# Patient Record
Sex: Female | Born: 1945 | Race: White | Hispanic: No | Marital: Married | Smoking: Never smoker
Health system: Southern US, Community
[De-identification: ages and names within clinical notes are randomized; demographics above are authoritative.]

## PROBLEM LIST (undated history)

## (undated) DIAGNOSIS — R7989 Other specified abnormal findings of blood chemistry: Secondary | ICD-10-CM

## (undated) DIAGNOSIS — H269 Unspecified cataract: Secondary | ICD-10-CM

## (undated) DIAGNOSIS — K8309 Other cholangitis: Secondary | ICD-10-CM

## (undated) DIAGNOSIS — M899 Disorder of bone, unspecified: Secondary | ICD-10-CM

## (undated) DIAGNOSIS — K219 Gastro-esophageal reflux disease without esophagitis: Secondary | ICD-10-CM

## (undated) DIAGNOSIS — E785 Hyperlipidemia, unspecified: Secondary | ICD-10-CM

## (undated) DIAGNOSIS — D649 Anemia, unspecified: Secondary | ICD-10-CM

## (undated) DIAGNOSIS — E041 Nontoxic single thyroid nodule: Secondary | ICD-10-CM

## (undated) DIAGNOSIS — K862 Cyst of pancreas: Secondary | ICD-10-CM

## (undated) DIAGNOSIS — N289 Disorder of kidney and ureter, unspecified: Secondary | ICD-10-CM

## (undated) DIAGNOSIS — E119 Type 2 diabetes mellitus without complications: Secondary | ICD-10-CM

## (undated) DIAGNOSIS — I10 Essential (primary) hypertension: Secondary | ICD-10-CM

## (undated) DIAGNOSIS — T7840XA Allergy, unspecified, initial encounter: Secondary | ICD-10-CM

## (undated) DIAGNOSIS — M353 Polymyalgia rheumatica: Secondary | ICD-10-CM

## (undated) DIAGNOSIS — K810 Acute cholecystitis: Secondary | ICD-10-CM

## (undated) DIAGNOSIS — R945 Abnormal results of liver function studies: Secondary | ICD-10-CM

## (undated) DIAGNOSIS — I4891 Unspecified atrial fibrillation: Secondary | ICD-10-CM

## (undated) DIAGNOSIS — G709 Myoneural disorder, unspecified: Secondary | ICD-10-CM

## (undated) DIAGNOSIS — R911 Solitary pulmonary nodule: Secondary | ICD-10-CM

## (undated) HISTORY — DX: Disorder of bone, unspecified: M89.9

## (undated) HISTORY — DX: Other cholangitis: K83.09

## (undated) HISTORY — DX: Hyperlipidemia, unspecified: E78.5

## (undated) HISTORY — DX: Disorder of kidney and ureter, unspecified: N28.9

## (undated) HISTORY — DX: Unspecified atrial fibrillation: I48.91

## (undated) HISTORY — DX: Allergy, unspecified, initial encounter: T78.40XA

## (undated) HISTORY — DX: Abnormal results of liver function studies: R94.5

## (undated) HISTORY — DX: Solitary pulmonary nodule: R91.1

## (undated) HISTORY — DX: Nontoxic single thyroid nodule: E04.1

## (undated) HISTORY — DX: Anemia, unspecified: D64.9

## (undated) HISTORY — DX: Acute cholecystitis: K81.0

## (undated) HISTORY — DX: Other specified abnormal findings of blood chemistry: R79.89

## (undated) HISTORY — DX: Unspecified cataract: H26.9

## (undated) HISTORY — DX: Myoneural disorder, unspecified: G70.9

## (undated) HISTORY — DX: Polymyalgia rheumatica: M35.3

## (undated) HISTORY — DX: Cyst of pancreas: K86.2

## (undated) HISTORY — PX: COLONOSCOPY: SHX174

---

## 2002-05-30 ENCOUNTER — Encounter: Admission: RE | Admit: 2002-05-30 | Discharge: 2002-08-28 | Payer: Self-pay | Admitting: Family Medicine

## 2002-09-12 ENCOUNTER — Encounter: Admission: RE | Admit: 2002-09-12 | Discharge: 2002-12-11 | Payer: Self-pay | Admitting: Family Medicine

## 2002-12-19 ENCOUNTER — Ambulatory Visit (HOSPITAL_COMMUNITY): Admission: RE | Admit: 2002-12-19 | Discharge: 2002-12-19 | Payer: Self-pay | Admitting: *Deleted

## 2012-05-28 ENCOUNTER — Emergency Department (HOSPITAL_BASED_OUTPATIENT_CLINIC_OR_DEPARTMENT_OTHER)
Admission: EM | Admit: 2012-05-28 | Discharge: 2012-05-28 | Disposition: A | Discharge status: Home / Self Care | Payer: Medicare Other | Attending: Emergency Medicine | Admitting: Emergency Medicine

## 2012-05-28 ENCOUNTER — Encounter (HOSPITAL_BASED_OUTPATIENT_CLINIC_OR_DEPARTMENT_OTHER): Payer: Self-pay | Admitting: *Deleted

## 2012-05-28 ENCOUNTER — Emergency Department (HOSPITAL_BASED_OUTPATIENT_CLINIC_OR_DEPARTMENT_OTHER): Payer: Medicare Other

## 2012-05-28 DIAGNOSIS — Y9389 Activity, other specified: Secondary | ICD-10-CM | POA: Insufficient documentation

## 2012-05-28 DIAGNOSIS — E119 Type 2 diabetes mellitus without complications: Secondary | ICD-10-CM | POA: Insufficient documentation

## 2012-05-28 DIAGNOSIS — Z79899 Other long term (current) drug therapy: Secondary | ICD-10-CM | POA: Insufficient documentation

## 2012-05-28 DIAGNOSIS — I10 Essential (primary) hypertension: Secondary | ICD-10-CM | POA: Insufficient documentation

## 2012-05-28 DIAGNOSIS — S8251XA Displaced fracture of medial malleolus of right tibia, initial encounter for closed fracture: Secondary | ICD-10-CM

## 2012-05-28 DIAGNOSIS — X500XXA Overexertion from strenuous movement or load, initial encounter: Secondary | ICD-10-CM | POA: Insufficient documentation

## 2012-05-28 DIAGNOSIS — Y9289 Other specified places as the place of occurrence of the external cause: Secondary | ICD-10-CM | POA: Insufficient documentation

## 2012-05-28 DIAGNOSIS — S8253XA Displaced fracture of medial malleolus of unspecified tibia, initial encounter for closed fracture: Secondary | ICD-10-CM | POA: Insufficient documentation

## 2012-05-28 HISTORY — DX: Type 2 diabetes mellitus without complications: E11.9

## 2012-05-28 HISTORY — DX: Essential (primary) hypertension: I10

## 2012-05-28 LAB — GLUCOSE, CAPILLARY: Glucose-Capillary: 90 mg/dL (ref 70–99)

## 2012-05-28 MED ORDER — HYDROCODONE-ACETAMINOPHEN 5-325 MG PO TABS: 2.0000 | ORAL_TABLET | ORAL | Status: DC | PRN

## 2012-05-28 NOTE — ED Provider Notes (Signed)
History    This chart was scribed for Geoffery Lyons, MD scribed by Magnus Sinning. The patient was seen in room MH09/MH09 at 16:20   CSN: [id number]  Arrival date & time 05/28/12  1418   Chief Complaint  Patient presents with  . Ankle Pain    (Consider location/radiation/quality/duration/timing/severity/associated sxs/prior treatment) Patient is a 67 y.o. female presenting with ankle pain. The history is provided by the patient. No language interpreter was used.  Ankle Pain  Tara Serrano is a 67 y.o. female who presents to the Emergency Department complaining of gradually worsening right ankle pain, onset yesterday night. She states they ran errands yesterday and that after she stepped out of the car yesterday when she started having sudden-onset pain on the inside of right ankle. She says that the pain is aggravated when bearing weight. She states that she has been hopping to get around.   Past Medical History  Diagnosis Date  . Diabetes mellitus without complication   . Hypertension     History reviewed. No pertinent past surgical history.  History reviewed. No pertinent family history.  History  Substance Use Topics  . Smoking status: Never Smoker   . Smokeless tobacco: Not on file  . Alcohol Use: No   Review of Systems  Musculoskeletal:       Right ankle pain  All other systems reviewed and are negative.    Allergies  Statins  Home Medications   Current Outpatient Rx  Name  Route  Sig  Dispense  Refill  . carvedilol (COREG) 25 MG tablet   Oral   Take 25 mg by mouth 2 (two) times daily with a meal.         . furosemide (LASIX) 20 MG tablet   Oral   Take 20 mg by mouth 2 (two) times daily.         Marland Kitchen glimepiride (AMARYL) 4 MG tablet   Oral   Take 8 mg by mouth daily before breakfast.         . losartan (COZAAR) 100 MG tablet   Oral   Take 100 mg by mouth daily.         Marland Kitchen NIFEdipine (ADALAT CC) 90 MG 24 hr tablet   Oral   Take 90 mg by mouth  daily.         . RABEprazole (ACIPHEX) 20 MG tablet   Oral   Take 20 mg by mouth daily.         . sitaGLIPtin (JANUVIA) 100 MG tablet   Oral   Take 100 mg by mouth daily.           BP 140/70  Pulse 78  Temp(Src) 98.1 F (36.7 C) (Oral)  Resp 20  Ht 5' (1.524 m)  Wt 207 lb (93.895 kg)  BMI 40.43 kg/m2  SpO2 98%  Physical Exam  Nursing note and vitals reviewed. Constitutional: She is oriented to person, place, and time. She appears well-developed and well-nourished. No distress.  HENT:  Head: Normocephalic and atraumatic.  Eyes: Conjunctivae and EOM are normal.  Neck: Neck supple. No tracheal deviation present.  Cardiovascular: Normal rate.   Pulmonary/Chest: Effort normal. No respiratory distress.  Abdominal: She exhibits no distension.  Musculoskeletal: She exhibits edema.  There is swelling and TTP over the medial malleolus. There is pain with ROM, but the ankle appears stable otherwise.    Neurological: She is alert and oriented to person, place, and time. No sensory deficit.  Skin: Skin is  dry.  Psychiatric: She has a normal mood and affect. Her behavior is normal.    ED Course  Procedures (including critical care time) DIAGNOSTIC STUDIES: Oxygen Saturation is 98% on room air, normal by my interpretation.    COORDINATION OF CARE: 16:21: Physical exam performed and radiology results provided. Informed patient of intent to wrap ankle and d/c with crutches, pain medications and recommendations to keep ankle elevated and minimal weight bearing.  Labs Reviewed  GLUCOSE, CAPILLARY   Dg Ankle Complete Right  05/28/2012  *RADIOLOGY REPORT*  Clinical Data: Ankle pain  RIGHT ANKLE - COMPLETE 3+ VIEW  Comparison: None.  Findings: Slight cortical irregularity of the medial talus could represent a fracture however this could also be normal.  The ankle mortise is intact.  There is a small bony fragment adjacent to the medial malleolus which is probably due to old injury  or an ossicle. There is no joint effusion.  There is calcaneal spurring.  IMPRESSION: Possible small avulsion fracture of the medial talus however this is not definitely a fracture.  Irregularity of the medial malleolus which appears chronic.   Original Report Authenticated By: Janeece Riggers, M.D.      No diagnosis found.    MDM  There appears to be an avulsion fracture of the medial malleolus or talus although there was no definite injury.  She will be treated with ace, rest, and prn follow up.  She was given crutches but had difficulty using these.  Prescription for walker given.  To return prn.    I personally performed the services described in this documentation, which was scribed in my presence. The recorded information has been reviewed and is accurate.           Geoffery Lyons, MD 05/29/12 [phone]

## 2012-05-28 NOTE — ED Notes (Signed)
Pt6 c/o right ankle pain since yesterday. No known injury. PMS intact.

## 2016-11-06 ENCOUNTER — Inpatient Hospital Stay (HOSPITAL_COMMUNITY): Payer: Medicare Other

## 2016-11-06 ENCOUNTER — Emergency Department (HOSPITAL_COMMUNITY): Payer: Medicare Other

## 2016-11-06 ENCOUNTER — Encounter (HOSPITAL_COMMUNITY): Payer: Self-pay | Admitting: Emergency Medicine

## 2016-11-06 ENCOUNTER — Inpatient Hospital Stay (HOSPITAL_COMMUNITY)
Admission: EM | Admit: 2016-11-06 | Discharge: 2016-11-12 | DRG: 853 | Disposition: A | Discharge status: Home / Self Care | Payer: Medicare Other | Attending: Family Medicine | Admitting: Family Medicine

## 2016-11-06 DIAGNOSIS — D649 Anemia, unspecified: Secondary | ICD-10-CM | POA: Diagnosis not present

## 2016-11-06 DIAGNOSIS — R6520 Severe sepsis without septic shock: Secondary | ICD-10-CM | POA: Diagnosis not present

## 2016-11-06 DIAGNOSIS — I481 Persistent atrial fibrillation: Secondary | ICD-10-CM | POA: Diagnosis present

## 2016-11-06 DIAGNOSIS — E876 Hypokalemia: Secondary | ICD-10-CM | POA: Diagnosis present

## 2016-11-06 DIAGNOSIS — K760 Fatty (change of) liver, not elsewhere classified: Secondary | ICD-10-CM | POA: Diagnosis present

## 2016-11-06 DIAGNOSIS — J9601 Acute respiratory failure with hypoxia: Secondary | ICD-10-CM | POA: Diagnosis present

## 2016-11-06 DIAGNOSIS — I5023 Acute on chronic systolic (congestive) heart failure: Secondary | ICD-10-CM | POA: Diagnosis present

## 2016-11-06 DIAGNOSIS — N183 Chronic kidney disease, stage 3 (moderate): Secondary | ICD-10-CM | POA: Diagnosis present

## 2016-11-06 DIAGNOSIS — Z79899 Other long term (current) drug therapy: Secondary | ICD-10-CM

## 2016-11-06 DIAGNOSIS — A419 Sepsis, unspecified organism: Secondary | ICD-10-CM | POA: Diagnosis present

## 2016-11-06 DIAGNOSIS — R948 Abnormal results of function studies of other organs and systems: Secondary | ICD-10-CM | POA: Diagnosis not present

## 2016-11-06 DIAGNOSIS — I4891 Unspecified atrial fibrillation: Secondary | ICD-10-CM | POA: Diagnosis not present

## 2016-11-06 DIAGNOSIS — I130 Hypertensive heart and chronic kidney disease with heart failure and stage 1 through stage 4 chronic kidney disease, or unspecified chronic kidney disease: Secondary | ICD-10-CM | POA: Diagnosis present

## 2016-11-06 DIAGNOSIS — Z888 Allergy status to other drugs, medicaments and biological substances status: Secondary | ICD-10-CM | POA: Diagnosis not present

## 2016-11-06 DIAGNOSIS — Z7984 Long term (current) use of oral hypoglycemic drugs: Secondary | ICD-10-CM

## 2016-11-06 DIAGNOSIS — K862 Cyst of pancreas: Secondary | ICD-10-CM | POA: Diagnosis not present

## 2016-11-06 DIAGNOSIS — E1122 Type 2 diabetes mellitus with diabetic chronic kidney disease: Secondary | ICD-10-CM | POA: Diagnosis present

## 2016-11-06 DIAGNOSIS — Z6839 Body mass index (BMI) 39.0-39.9, adult: Secondary | ICD-10-CM | POA: Diagnosis not present

## 2016-11-06 DIAGNOSIS — K819 Cholecystitis, unspecified: Secondary | ICD-10-CM | POA: Diagnosis not present

## 2016-11-06 DIAGNOSIS — R6521 Severe sepsis with septic shock: Secondary | ICD-10-CM | POA: Diagnosis present

## 2016-11-06 DIAGNOSIS — G9341 Metabolic encephalopathy: Secondary | ICD-10-CM | POA: Diagnosis present

## 2016-11-06 DIAGNOSIS — R911 Solitary pulmonary nodule: Secondary | ICD-10-CM | POA: Diagnosis present

## 2016-11-06 DIAGNOSIS — N179 Acute kidney failure, unspecified: Secondary | ICD-10-CM | POA: Diagnosis present

## 2016-11-06 DIAGNOSIS — R160 Hepatomegaly, not elsewhere classified: Secondary | ICD-10-CM | POA: Diagnosis present

## 2016-11-06 DIAGNOSIS — E669 Obesity, unspecified: Secondary | ICD-10-CM | POA: Diagnosis present

## 2016-11-06 DIAGNOSIS — J810 Acute pulmonary edema: Secondary | ICD-10-CM | POA: Diagnosis not present

## 2016-11-06 DIAGNOSIS — R7989 Other specified abnormal findings of blood chemistry: Secondary | ICD-10-CM | POA: Diagnosis present

## 2016-11-06 DIAGNOSIS — M899 Disorder of bone, unspecified: Secondary | ICD-10-CM

## 2016-11-06 DIAGNOSIS — R0603 Acute respiratory distress: Secondary | ICD-10-CM

## 2016-11-06 DIAGNOSIS — K810 Acute cholecystitis: Secondary | ICD-10-CM

## 2016-11-06 DIAGNOSIS — I5021 Acute systolic (congestive) heart failure: Secondary | ICD-10-CM | POA: Diagnosis not present

## 2016-11-06 DIAGNOSIS — K869 Disease of pancreas, unspecified: Secondary | ICD-10-CM

## 2016-11-06 DIAGNOSIS — R0781 Pleurodynia: Secondary | ICD-10-CM

## 2016-11-06 DIAGNOSIS — K8309 Other cholangitis: Secondary | ICD-10-CM

## 2016-11-06 DIAGNOSIS — I5043 Acute on chronic combined systolic (congestive) and diastolic (congestive) heart failure: Secondary | ICD-10-CM | POA: Diagnosis not present

## 2016-11-06 DIAGNOSIS — R079 Chest pain, unspecified: Secondary | ICD-10-CM | POA: Diagnosis not present

## 2016-11-06 DIAGNOSIS — R945 Abnormal results of liver function studies: Secondary | ICD-10-CM

## 2016-11-06 DIAGNOSIS — K8020 Calculus of gallbladder without cholecystitis without obstruction: Secondary | ICD-10-CM

## 2016-11-06 DIAGNOSIS — K219 Gastro-esophageal reflux disease without esophagitis: Secondary | ICD-10-CM | POA: Diagnosis present

## 2016-11-06 DIAGNOSIS — R630 Anorexia: Secondary | ICD-10-CM | POA: Diagnosis present

## 2016-11-06 DIAGNOSIS — I110 Hypertensive heart disease with heart failure: Secondary | ICD-10-CM | POA: Diagnosis not present

## 2016-11-06 DIAGNOSIS — K8066 Calculus of gallbladder and bile duct with acute and chronic cholecystitis without obstruction: Secondary | ICD-10-CM | POA: Diagnosis present

## 2016-11-06 DIAGNOSIS — K830 Cholangitis: Secondary | ICD-10-CM | POA: Diagnosis present

## 2016-11-06 DIAGNOSIS — R1013 Epigastric pain: Secondary | ICD-10-CM | POA: Diagnosis present

## 2016-11-06 HISTORY — DX: Gastro-esophageal reflux disease without esophagitis: K21.9

## 2016-11-06 HISTORY — DX: Acute cholecystitis: K81.0

## 2016-11-06 LAB — I-STAT CG4 LACTIC ACID, ED
LACTIC ACID, VENOUS: 4.09 mmol/L — AB (ref 0.5–1.9)
Lactic Acid, Venous: 2.1 mmol/L (ref 0.5–1.9)

## 2016-11-06 LAB — I-STAT ARTERIAL BLOOD GAS, ED
Acid-Base Excess: 3 mmol/L — ABNORMAL HIGH (ref 0.0–2.0)
Bicarbonate: 27 mmol/L (ref 20.0–28.0)
O2 Saturation: 95 %
PCO2 ART: 37.9 mmHg (ref 32.0–48.0)
PO2 ART: 70 mmHg — AB (ref 83.0–108.0)
TCO2: 28 mmol/L (ref 22–32)
pH, Arterial: 7.461 — ABNORMAL HIGH (ref 7.350–7.450)

## 2016-11-06 LAB — CBC
HCT: 37.8 % (ref 36.0–46.0)
Hemoglobin: 12.2 g/dL (ref 12.0–15.0)
MCH: 28.3 pg (ref 26.0–34.0)
MCHC: 32.3 g/dL (ref 30.0–36.0)
MCV: 87.7 fL (ref 78.0–100.0)
PLATELETS: 256 10*3/uL (ref 150–400)
RBC: 4.31 MIL/uL (ref 3.87–5.11)
RDW: 13.6 % (ref 11.5–15.5)
WBC: 10 10*3/uL (ref 4.0–10.5)

## 2016-11-06 LAB — GLUCOSE, CAPILLARY
GLUCOSE-CAPILLARY: 155 mg/dL — AB (ref 65–99)
GLUCOSE-CAPILLARY: 189 mg/dL — AB (ref 65–99)
Glucose-Capillary: 178 mg/dL — ABNORMAL HIGH (ref 65–99)
Glucose-Capillary: 193 mg/dL — ABNORMAL HIGH (ref 65–99)
Glucose-Capillary: 170 mg/dL — ABNORMAL HIGH (ref 65–99)

## 2016-11-06 LAB — URINALYSIS, ROUTINE W REFLEX MICROSCOPIC
BACTERIA UA: NONE SEEN
Bilirubin Urine: NEGATIVE
Glucose, UA: 50 mg/dL — AB
Hgb urine dipstick: NEGATIVE
Ketones, ur: NEGATIVE mg/dL
Leukocytes, UA: NEGATIVE
Nitrite: NEGATIVE
PROTEIN: 30 mg/dL — AB
SPECIFIC GRAVITY, URINE: 1.032 — AB (ref 1.005–1.030)
pH: 5 (ref 5.0–8.0)

## 2016-11-06 LAB — LACTIC ACID, PLASMA
LACTIC ACID, VENOUS: 2.7 mmol/L — AB (ref 0.5–1.9)
LACTIC ACID, VENOUS: 2.5 mmol/L — AB (ref 0.5–1.9)

## 2016-11-06 LAB — COMPREHENSIVE METABOLIC PANEL
ALBUMIN: 3.2 g/dL — AB (ref 3.5–5.0)
ALK PHOS: 399 U/L — AB (ref 38–126)
ALT: 163 U/L — AB (ref 14–54)
AST: 246 U/L — ABNORMAL HIGH (ref 15–41)
Anion gap: 14 (ref 5–15)
BILIRUBIN TOTAL: 2.5 mg/dL — AB (ref 0.3–1.2)
BUN: 25 mg/dL — AB (ref 6–20)
CO2: 27 mmol/L (ref 22–32)
CREATININE: 1.33 mg/dL — AB (ref 0.44–1.00)
Calcium: 8.8 mg/dL — ABNORMAL LOW (ref 8.9–10.3)
Chloride: 95 mmol/L — ABNORMAL LOW (ref 101–111)
GFR calc Af Amer: 45 mL/min — ABNORMAL LOW (ref >60)
GFR, EST NON AFRICAN AMERICAN: 39 mL/min — AB (ref >60)
GLUCOSE: 251 mg/dL — AB (ref 65–99)
Potassium: 2.6 mmol/L — CL (ref 3.5–5.1)
Sodium: 136 mmol/L (ref 135–145)
Total Protein: 7.3 g/dL (ref 6.5–8.1)

## 2016-11-06 LAB — POCT I-STAT 3, ART BLOOD GAS (G3+)
ACID-BASE EXCESS: 7 mmol/L — AB (ref 0.0–2.0)
Bicarbonate: 31.8 mmol/L — ABNORMAL HIGH (ref 20.0–28.0)
O2 Saturation: 97 %
TCO2: 33 mmol/L — ABNORMAL HIGH (ref 22–32)
pCO2 arterial: 44.2 mmHg (ref 32.0–48.0)
pH, Arterial: 7.465 — ABNORMAL HIGH (ref 7.350–7.450)
pO2, Arterial: 90 mmHg (ref 83.0–108.0)

## 2016-11-06 LAB — BRAIN NATRIURETIC PEPTIDE: B Natriuretic Peptide: 369.6 pg/mL — ABNORMAL HIGH (ref 0.0–100.0)

## 2016-11-06 LAB — LIPASE, BLOOD: Lipase: 28 U/L (ref 11–51)

## 2016-11-06 LAB — I-STAT TROPONIN, ED: TROPONIN I, POC: 0.01 ng/mL (ref 0.00–0.08)

## 2016-11-06 LAB — TROPONIN I
TROPONIN I: 0.03 ng/mL — AB (ref <0.03)
TROPONIN I: 0.05 ng/mL — AB (ref <0.03)
TROPONIN I: 0.03 ng/mL — AB (ref <0.03)

## 2016-11-06 LAB — MAGNESIUM: Magnesium: 1.6 mg/dL — ABNORMAL LOW (ref 1.7–2.4)

## 2016-11-06 LAB — MRSA PCR SCREENING: MRSA by PCR: NEGATIVE

## 2016-11-06 LAB — PROCALCITONIN: Procalcitonin: 3.59 ng/mL

## 2016-11-06 MED ORDER — SODIUM CHLORIDE 0.9 % IV SOLN
INTRAVENOUS | Status: DC
Start: 2016-11-06 — End: 2016-11-06

## 2016-11-06 MED ORDER — POTASSIUM CHLORIDE CRYS ER 20 MEQ PO TBCR
40.0000 meq | EXTENDED_RELEASE_TABLET | Freq: Once | ORAL | Status: AC
  Administered 2016-11-06: 40 meq via ORAL
  Filled 2016-11-06: qty 2

## 2016-11-06 MED ORDER — INSULIN ASPART 100 UNIT/ML ~~LOC~~ SOLN
2.0000 [IU] | SUBCUTANEOUS | Status: DC
  Administered 2016-11-06 (×5): 4 [IU] via SUBCUTANEOUS
  Administered 2016-11-07: 2 [IU] via SUBCUTANEOUS
  Administered 2016-11-07: 4 [IU] via SUBCUTANEOUS

## 2016-11-06 MED ORDER — SODIUM CHLORIDE 0.9 % IV SOLN
250.0000 mL | INTRAVENOUS | Status: DC | PRN
  Administered 2016-11-07: 250 mL via INTRAVENOUS

## 2016-11-06 MED ORDER — FUROSEMIDE 10 MG/ML IJ SOLN
40.0000 mg | Freq: Once | INTRAMUSCULAR | Status: AC
  Administered 2016-11-06: 40 mg via INTRAVENOUS
  Filled 2016-11-06: qty 4

## 2016-11-06 MED ORDER — HEPARIN SODIUM (PORCINE) 5000 UNIT/ML IJ SOLN
5000.0000 [IU] | Freq: Three times a day (TID) | INTRAMUSCULAR | Status: AC
  Administered 2016-11-06 (×2): 5000 [IU] via SUBCUTANEOUS
  Filled 2016-11-06 (×2): qty 1

## 2016-11-06 MED ORDER — PANTOPRAZOLE SODIUM 40 MG IV SOLR
40.0000 mg | INTRAVENOUS | Status: DC
  Administered 2016-11-06 – 2016-11-12 (×6): 40 mg via INTRAVENOUS
  Filled 2016-11-06 (×6): qty 40

## 2016-11-06 MED ORDER — POTASSIUM CHLORIDE 10 MEQ/100ML IV SOLN
10.0000 meq | INTRAVENOUS | Status: AC
  Administered 2016-11-06 (×2): 10 meq via INTRAVENOUS
  Filled 2016-11-06 (×2): qty 100

## 2016-11-06 MED ORDER — FENTANYL CITRATE (PF) 100 MCG/2ML IJ SOLN
12.5000 ug | INTRAMUSCULAR | Status: DC | PRN
  Administered 2016-11-06: 12.5 ug via INTRAVENOUS
  Filled 2016-11-06: qty 2

## 2016-11-06 MED ORDER — PIPERACILLIN-TAZOBACTAM 3.375 G IVPB
3.3750 g | Freq: Three times a day (TID) | INTRAVENOUS | Status: DC
  Administered 2016-11-06 – 2016-11-11 (×17): 3.375 g via INTRAVENOUS
  Filled 2016-11-06 (×19): qty 50

## 2016-11-06 MED ORDER — ONDANSETRON HCL 4 MG/2ML IJ SOLN
4.0000 mg | Freq: Once | INTRAMUSCULAR | Status: AC
  Administered 2016-11-06: 4 mg via INTRAVENOUS
  Filled 2016-11-06: qty 2

## 2016-11-06 MED ORDER — HEPARIN SODIUM (PORCINE) 1000 UNIT/ML IJ SOLN
5000.0000 [IU] | Freq: Three times a day (TID) | INTRAMUSCULAR | Status: DC
  Filled 2016-11-06: qty 5

## 2016-11-06 MED ORDER — FENTANYL CITRATE (PF) 100 MCG/2ML IJ SOLN
50.0000 ug | Freq: Once | INTRAMUSCULAR | Status: AC
  Administered 2016-11-06: 50 ug via INTRAVENOUS
  Filled 2016-11-06: qty 2

## 2016-11-06 MED ORDER — MAGNESIUM SULFATE 2 GM/50ML IV SOLN
2.0000 g | Freq: Once | INTRAVENOUS | Status: AC
  Administered 2016-11-06: 2 g via INTRAVENOUS
  Filled 2016-11-06: qty 50

## 2016-11-06 MED ORDER — SODIUM CHLORIDE 0.9 % IV BOLUS (SEPSIS): 1000.0000 mL | Freq: Once | INTRAVENOUS | Status: DC

## 2016-11-06 MED ORDER — TECHNETIUM TC 99M MEBROFENIN IV KIT
5.0000 | PACK | Freq: Once | INTRAVENOUS | Status: AC | PRN
  Administered 2016-11-06: 5 via INTRAVENOUS

## 2016-11-06 MED ORDER — IOPAMIDOL (ISOVUE-300) INJECTION 61%
INTRAVENOUS | Status: AC
  Administered 2016-11-06: 100 mL
  Filled 2016-11-06: qty 100

## 2016-11-06 MED ORDER — HYDRALAZINE HCL 20 MG/ML IJ SOLN: 10.0000 mg | Freq: Four times a day (QID) | INTRAMUSCULAR | Status: DC | PRN

## 2016-11-06 NOTE — ED Notes (Signed)
Dr Wyvonnia Dusky is given a copy of lactic acid results 4.09

## 2016-11-06 NOTE — Progress Notes (Signed)
Patients SATs dropped to high 70s, low 80s. Very drowsy, will wake for a few seconds then go back to sleep. Respiratory called, patient put on a veni mask with no improvement, switched to bipap. MD made aware, blood gas drawn with no significant changes. Currently SATs at 97%. Will continue to monitor.

## 2016-11-06 NOTE — ED Notes (Signed)
Patient transported to Ultrasound 

## 2016-11-06 NOTE — ED Notes (Signed)
Called main lab to add on Mg 

## 2016-11-06 NOTE — Progress Notes (Signed)
Patient ID: Tara Serrano, female   DOB: [birth year], 71 y.o.   MRN: [id number]   Request for percutaneous cholecystostomy drain placement  CT imaging reviewed with Dr Vernard Gambles  He has recommended HIDA scan Now ordered  Will review results as soon as available  Plan accordingly

## 2016-11-06 NOTE — Procedures (Signed)
Pt transported from ED to 2M11 on Bipap, no complications.

## 2016-11-06 NOTE — H&P (Signed)
PULMONARY / CRITICAL CARE MEDICINE   Name: Tara Serrano MRN: [id number] DOB: Nov 03, 1945    ADMISSION DATE:  11/06/2016 CONSULTATION DATE:  11/06/2016  REFERRING MD:  Dr. Wyvonnia Dusky   CHIEF COMPLAINT:  Acute Cholecystis   HISTORY OF PRESENT ILLNESS:   71 year old female with PMH of HTN, DM, GERD   Presents to ED on 8/31 with 1 week history of upper abdominal pain with nausea and poor appetite. Upon arrival WBC 10, LA 2.10, AST 246, ALT 163. CT A/P with distended gallbladder and mild biliary dilatation. Through course of stay in ED patient became hypoxic requiring BiPAP. CXR with diffuse bilateral pulmonary infiltrates consistent with pulmonary edema. Surgery deemed patient to unstable for surgery at this time. PCCM asked to admit.    PAST MEDICAL HISTORY :  She  has a past medical history of Diabetes mellitus without complication ([place]); GERD (gastroesophageal reflux disease); and Hypertension.  PAST SURGICAL HISTORY: She  has no past surgical history on file.  Allergies  Allergen Reactions  . Statins Other (See Comments)    Muscle problems    No current facility-administered medications on file prior to encounter.    Current Outpatient Prescriptions on File Prior to Encounter  Medication Sig  . carvedilol (COREG) 25 MG tablet Take 25 mg by mouth 2 (two) times daily with a meal.  . furosemide (LASIX) 20 MG tablet Take 20 mg by mouth 2 (two) times daily.  Marland Kitchen glimepiride (AMARYL) 4 MG tablet Take 8 mg by mouth daily before breakfast.  . HYDROcodone-acetaminophen (NORCO) 5-325 MG per tablet Take 2 tablets by mouth every 4 (four) hours as needed for pain.  Marland Kitchen losartan (COZAAR) 100 MG tablet Take 100 mg by mouth daily.  Marland Kitchen NIFEdipine (ADALAT CC) 90 MG 24 hr tablet Take 90 mg by mouth daily.  . RABEprazole (ACIPHEX) 20 MG tablet Take 20 mg by mouth daily.  . sitaGLIPtin (JANUVIA) 100 MG tablet Take 100 mg by mouth daily.    FAMILY HISTORY:  Her has no family status information on  file.    SOCIAL HISTORY: She  reports that she has never smoked. She does not have any smokeless tobacco history on file. She reports that she does not drink alcohol or use drugs.  REVIEW OF SYSTEMS:   All negative; except for those that are bolded, which indicate positives.  Constitutional: weight loss, weight gain, night sweats, fevers, chills, fatigue, weakness.  HEENT: headaches, sore throat, sneezing, nasal congestion, post nasal drip, difficulty swallowing, tooth/dental problems, visual complaints, visual changes, ear aches. Neuro: difficulty with speech, weakness, numbness, ataxia. CV:  chest pain, orthopnea, PND, swelling in lower extremities, dizziness, palpitations, syncope.  Resp: cough, hemoptysis, dyspnea, wheezing. GI: heartburn, indigestion, abdominal pain, nausea, vomiting, diarrhea, constipation, change in bowel habits, loss of appetite, hematemesis, melena, hematochezia.  GU: dysuria, change in color of urine, urgency or frequency, flank pain, hematuria. MSK: joint pain or swelling, decreased range of motion. Psych: change in mood or affect, depression, anxiety, suicidal ideations, homicidal ideations. Skin: rash, itching, bruising.   SUBJECTIVE:    VITAL SIGNS: BP 140/76   Pulse 99   Temp (!) 100.7 F (38.2 C) (Oral)   Resp (!) 37   Ht 5' (1.524 m)   Wt 90.7 kg (200 lb)   SpO2 91%   BMI 39.06 kg/m   HEMODYNAMICS:    VENTILATOR SETTINGS:    INTAKE / OUTPUT: I/O last 3 completed shifts: In: 150 [IV Piggyback:150] Out: -   PHYSICAL EXAMINATION:  General:  Adult female, moderate respiratory distress  Neuro:  Lethargic, follows commands, moves extremities  HEENT:  MMM Cardiovascular:  Tachy, Irregular, no MRG  Lungs:  Crackles at bases, labored  Abdomen:  Tender, active bowel sounds  Musculoskeletal:  -edema  Skin:  Warm, dry, intact   LABS:  BMET  Recent Labs Lab 11/06/16 0223  NA 136  K 2.6*  CL 95*  CO2 27  BUN 25*  CREATININE 1.33*   GLUCOSE 251*    Electrolytes  Recent Labs Lab 11/06/16 0223 11/06/16 0331  CALCIUM 8.8*  --   MG  --  1.6*    CBC  Recent Labs Lab 11/06/16 0223  WBC 10.0  HGB 12.2  HCT 37.8  PLT 256    Coag's No results for input(s): APTT, INR in the last 168 hours.  Sepsis Markers  Recent Labs Lab 11/06/16 0423 11/06/16 0702  LATICACIDVEN 2.10* 4.09*    ABG  Recent Labs Lab 11/06/16 0743  PHART 7.461*  PCO2ART 37.9  PO2ART 70.0*    Liver Enzymes  Recent Labs Lab 11/06/16 0223  AST 246*  ALT 163*  ALKPHOS 399*  BILITOT 2.5*  ALBUMIN 3.2*    Cardiac Enzymes No results for input(s): TROPONINI, PROBNP in the last 168 hours.  Glucose No results for input(s): GLUCAP in the last 168 hours.  Imaging Ct Abdomen Pelvis W Contrast  Result Date: 11/06/2016 CLINICAL DATA:  Epigastric pain for 1 week. Fever. Suspect infection. History of hypertension and diabetes. EXAM: CT ABDOMEN AND PELVIS WITH CONTRAST TECHNIQUE: Multidetector CT imaging of the abdomen and pelvis was performed using the standard protocol following bolus administration of intravenous contrast. CONTRAST:  183mL ISOVUE-300 IOPAMIDOL (ISOVUE-300) INJECTION 61% COMPARISON:  Abdominal ultrasound November 06, 2016 at 0458 hours FINDINGS: LOWER CHEST: 10 mm RIGHT lower lobe pulmonary nodule (axial 2/66, series 5). Hazy ground-glass opacities with interlobular septal thickening and atelectasis. The heart is mildly enlarged. Mild coronary artery calcifications. No pericardial effusion. Retained versus refluxed contrast in distal esophagus. HEPATOBILIARY: Distended gallbladder. Minimal intrahepatic biliary dilatation. The liver is 24 cm in cranial caudad dimension . Common bile duct is 10 mm without choledocholithiasis. PANCREAS: 11 mm cystic mass in tail of the pancreas, pancreas is otherwise unremarkable. SPLEEN: Normal. ADRENALS/URINARY TRACT: Kidneys are orthotopic, demonstrating symmetric enhancement. 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 Multifocal scarring in too small to characterize hypodensities bilateral kidneys. No nephrolithiasis or hydronephrosis. 12 mm cyst upper pole LEFT kidney. The unopacified ureters are normal in course and caliber. Delayed imaging through the kidneys demonstrates symmetric prompt contrast excretion within the proximal urinary collecting system. Urinary bladder is partially distended and unremarkable. Normal adrenal glands. STOMACH/BOWEL: The stomach, small and large bowel are normal in course and caliber without inflammatory changes, sensitivity decreased without oral contrast. Moderate descending and sigmoid colonic diverticulosis. The appendix is not discretely identified, however there are no inflammatory changes in the right lower quadrant. VASCULAR/LYMPHATIC: Aortoiliac vessels are normal in course and caliber. Mild calcific atherosclerosis. No lymphadenopathy by CT size criteria. REPRODUCTIVE: Normal. OTHER: No intraperitoneal free fluid or free air. MUSCULOSKELETAL: Expanded LEFT anterior rib (image 1/ 66), possible old fracture. Osteopenia. Multilevel moderate to severe degenerative change of the spine. IMPRESSION: 1. Distended gallbladder with mild biliary dilatation. Findings seen with cholecystitis or, possibly cholangitis. 2. Hepatomegaly. 3. Mild cardiomegaly and suspected pulmonary edema. Recommend chest radiograph. 4. **An incidental finding of potential clinical significance has been found. 10 mm  RIGHT lower lobe pulmonary nodule. Consider CT, PET-CT or tissue sampling at 3 months. This recommendation follows the consensus statement: Guidelines for Management of Small Pulmonary Nodules Detected on CT Scans: A Statement from the [place] as published in Radiology 2005; 237:395-400. ** 5. **An incidental finding of potential clinical significance has been found. 11 mm cystic mass tail of pancreas. Recommend  re- imaging at 2 years. This recommendation follows ACR consensus guidelines: Management of Incidental Pancreatic Cysts: A White Paper of the ACR Incidental Findings Committee. [place] 1696;78:938-101.** Aortic Atherosclerosis (ICD10-I70.0). Electronically Signed   By: Elon Alas M.D.   On: 11/06/2016 06:13   Dg Chest Portable 1 View  Result Date: 11/06/2016 CLINICAL DATA:  Shortness of breath. EXAM: PORTABLE CHEST 1 VIEW COMPARISON:  No prior. FINDINGS: Cardiomegaly with pulmonary venous congestion and diffuse bilateral pulmonary infiltrates consistent with pulmonary edema. No pleural effusion or pneumothorax. IMPRESSION: Cardiomegaly with pulmonary venous congestion and diffuse bilateral pulmonary infiltrates consistent with pulmonary edema . Electronically Signed   By: Marcello Moores  Register   On: 11/06/2016 06:40   US Abdomen Limited Ruq  Result Date: 11/06/2016 CLINICAL DATA:  Abnormal liver function tests. EXAM: ULTRASOUND ABDOMEN LIMITED RIGHT UPPER QUADRANT COMPARISON:  None. FINDINGS: Gallbladder: There are small calculi measuring a few mm each. There is a small volume intraluminal sludge. The patient was not tender to probe pressure over the gallbladder. There is mild mural thickening, 4.8 mm. No pericholecystic fluid. Common bile duct: Diameter: Upper normal, 7.2 mm. Liver: Diffuse echogenicity of hepatic parenchyma without focal lesion. Portal vein is patent on color Doppler imaging with normal direction of blood flow towards the liver. IMPRESSION: 1. Cholelithiasis. Slight gallbladder mural thickening. No tenderness over the gallbladder. 2. Increased echogenicity of hepatic parenchyma may represent fatty infiltration or hepatocellular disease. Electronically Signed   By: Andreas Newport M.D.   On: 11/06/2016 05:10     STUDIES:  [place] RUQ 8/31 > Cholelithiasis. Slight gallbladder mural thickening. No tenderness over the gallbladder. Increased echogenicity of hepatic parenchyma may  represent fatty infiltration or hepatocellular disease. CT A/P 8/31 > Distended gallbladder with mild biliary dilatation. Findings seen with cholecystitis or, possibly cholangitis. Hepatomegaly. Mild cardiomegaly and suspected pulmonary edema. Recommend chest radiograph. An incidental finding of potential clinical significance has been found. 10 mm RIGHT lower lobe pulmonary nodule. Consider CT, PET-CT or tissue sampling at 3 months. This recommendation follows the consensus statement: Guidelines for Management of Small Pulmonary Nodules Detected on CT Scans: A Statement from the [place] as published in Radiology 2005; 237:395-400. An incidental finding of potential clinical significance has been found. 11 mm cystic mass tail of pancreas CXR 8/31 > Cardiomegaly with pulmonary venous congestion and diffuse bilateral pulmonary infiltrates consistent with pulmonary edema  CULTURES: Blood 8/31 >> Urine 8/31 >>  ANTIBIOTICS: Zosyn 8/31 >>  SIGNIFICANT EVENTS: 8/31 > Presents to ED   LINES/TUBES: PIV   DISCUSSION: 71 year old presents with one week of abdominal pain with n/v. Found to have acute cholecystis. While in ED became hypoxic requiring BiPAP.   ASSESSMENT / PLAN:  PULMONARY A: Acute Hypoxic Respiratory Failure secondary to pulmonary edema  10 mm Right lower lobe nodule  P:   BiPAP as needed  Maintain Oxygen Saturation > 92   CARDIOVASCULAR A:  New Onset A.Fib H/O HTN P:  Cardiac Monitoring Will hold off on anticoagulation at this time given pending procedures  Trend Troponin  Hydralazine PRN for Systolic >751   RENAL A:  AKI  Lactic Acidosis  Hypokalemia  P:   Trend BMP Replace electrolytes > replacing mag and K now  Lasix 40 meq x 1 ordered in ED  Place Foley Catheter   GASTROINTESTINAL A:   GERD  Elevated LFT in setting of cholecystitis  11 mm cystic mass tail of pancreas P:   NPO PPI  Trend LFT   HEMATOLOGIC A:   No issues  P:  Trend  CBC  SCDS SQ Heparin   INFECTIOUS A:   Septic Shock secondary to acute cholecystitis  P:   Surgery Following  Trend WBC and Fever curve  Trend Culture Data  Continue Zosyn  IR to place Perc Drain  Trend PCT and Lactic Acid   ENDOCRINE A:   DM   P:   Trend Glucose  SSI   NEUROLOGIC A:   Acute Metabolic Encephalopathy  Acute Pain  P:   Monitor  Fentanyl PRN    FAMILY  - Updates: Family updated at bedside. States patient has living will. Would not want CPR/ACLS medications however okay with short term intubation.   - Inter-disciplinary family meet or Palliative Care meeting due by:  11/13/2016   CC Time: 5 minutes   Hayden Pedro, AGACNP-BC [place]  Pgr: [phone]  PCCM Pgr: [phone]

## 2016-11-06 NOTE — Progress Notes (Signed)
Patient ID: Tara Serrano, female   DOB: [birth year], 71 y.o.   MRN: [id number]  HIDA has been reviewed by Dr Vernard Gambles HIDA is abnormal, but need another image in am  Will keep pt npo in case we move ahead with perc chole drain on Sept 1.  IR PA will see pt for consent in am if moving forward.

## 2016-11-06 NOTE — ED Notes (Signed)
Dr Wyvonnia Dusky given a copy of lactic acid results 2.10

## 2016-11-06 NOTE — Consult Note (Signed)
[facility] Consult: 8:51 AM 11/06/2016  LOS: 0 days    Referring Provider: Dr Halford Chessman  Primary Care Physician:  Patient, No Pcp Per Primary Gastroenterologist:  [facility], [place]: [place].  Dr Erlene Quan   Reason for Consultation:  Cholecystitis, ? cholangitis   HPI: Tara Serrano is a 71 y.o. female.  PMH type 2 NIDDM.  HTN.  GERD.  CKD stage 3.  Obesity.   03/2013 Colonoscopy and EGD, Dr Erlene Quan.  Colon polyps found.  CT ab/pelvis w/wo contrastin 02/2016 for ? Of renal angiomyolipoma:  bil renal cysts.  Adrenal adenoma.  Cholelithiasis.  Sigmoid diverticulosis.  Pancreas and liver WNL.    In Aug 01, 2016 treated with Cipro for UTI (N/V, fever, dark urine).  At the time T Bili 4.4, Alk Phos 523, AST/ALT 275/219. Albumin 3.4, Plt count 258. WBCs12.5, Hgb 11.3.  In 07/2016 T bili 0.4.  Alk phos 262.  AST/ALT 304/179. Taking Tylenol 1-2 times per week.  Weight 196# on 8/22.  3+ pitting edema in lower legs/feet.   Seen at GI office 8/22 for elevated LFTs in a non-drinker.  PA ? If pt had passed a gallstone in 08-02-2022 .  Repeated LFTs and other labs: T bili 0.4.  Alk phos 182.  AST/ALT 14/25.   Hep ABC negative.  ANA negative.  Smooth muscle Ab 48, which is "moderately strong positive".  Ferritin 52.  A1 AT normal.  Planned abd ultrasound had yet to be performed.    Now with 1 week of anorexia, malaise, RUQ/epigastric pain.  Presented 2 AM to ED.  Placed on Bipap in ED for symptomatic hypoxia.  New onset A fib with RVR.  CHF.   T bili 2.5.  Alk phos 399.  AST/ALT 246/163.  Lipase 28.  GFR 39.  Hypokalemia, Hypomagnesemia.  Normal WBCs and Hgb.  Elevated Lactate.   Abd ultrasound: cholelithiasis/sludge, slight GB wall thickening. 7.2 mm CBD.  Fatty liver vs hepatocellular dz.   CT abdomen pelvis:   Distended gallbladder.  10 mm CBD.  Minor intrahepatic biliary ductal dilatation. ? Cholecystitis, possibly cholangitis? 11 mm mass pancreatic tail.  Hepatomegaly. 10 mm right lower lobe lung nodule.  Reflux vs retained contrast in distal esophagus.  Right kidney mass.  Colonic diverticulosis.  CXR with flash pulmonary edema.    Too sick for lap chole per Dr Hulen Skains, cholecystostomy tube planned.  Abdominal pain improved following injection of fentanyl.    Past Medical History:  Diagnosis Date  . Diabetes mellitus without complication ([place])   . GERD (gastroesophageal reflux disease)   . Hypertension     History reviewed. No pertinent surgical history.  Prior to Admission medications   Medication Sig Start Date End Date Taking? Authorizing Provider  carvedilol (COREG) 25 MG tablet Take 25 mg by mouth 2 (two) times daily with a meal.    [provider]  furosemide (LASIX) 20 MG tablet Take 20 mg by mouth 2 (two) times daily.    [provider]  glimepiride (AMARYL) 4 MG tablet Take 8 mg by mouth  daily before breakfast.    [provider]  HYDROcodone-acetaminophen (NORCO) 5-325 MG per tablet Take 2 tablets by mouth every 4 (four) hours as needed for pain. 05/28/12   Veryl Speak, MD  losartan (COZAAR) 100 MG tablet Take 100 mg by mouth daily.    [provider]  NIFEdipine (ADALAT CC) 90 MG 24 hr tablet Take 90 mg by mouth daily.    [provider]  RABEprazole (ACIPHEX) 20 MG tablet Take 20 mg by mouth daily.    [provider]  sitaGLIPtin (JANUVIA) 100 MG tablet Take 100 mg by mouth daily.    [provider]    Scheduled Meds: . heparin  5,000 Units Subcutaneous Q8H  . insulin aspart  2-6 Units Subcutaneous Q4H  . pantoprazole (PROTONIX) IV  40 mg Intravenous Q24H   Infusions: . sodium chloride    . piperacillin-tazobactam (ZOSYN)  IV 3.375 g (11/06/16 0647)   PRN Meds: sodium chloride, fentaNYL (SUBLIMAZE) injection,  hydrALAZINE   Allergies as of 11/06/2016 - Review Complete 11/06/2016  Allergen Reaction Noted  . Statins Other (See Comments) 05/28/2012    No family history on file.  Social History   Social History  . Marital status: Married    Spouse name: N/A  . Number of children: N/A  . Years of education: N/A   Occupational History  . Not on file.   Social History Main Topics  . Smoking status: Never Smoker  . Smokeless tobacco: Not on file  . Alcohol use No  . Drug use: No  . Sexual activity: Not on file   Other Topics Concern  . Not on file   Social History Narrative  . No narrative on file    REVIEW OF SYSTEMS: A lot of the recent few of systems is obtained from other providers notes. With the patient having received fentanyl and now on BiPAP, communication is difficult. Constitutional:  Malaise, fatigue. ENT:  No nose bleeds Pulm:  Difficulty breathing acutely this morning. CV:  No palpitations, no LE edema.  GU:  No hematuria, no frequency GI:  Per HPI Heme:  Denies unusual or excessive bleeding/bruising.  No history of anemia.   Transfusions:  Denies previous transfusions. Neuro:  No headaches, no peripheral tingling or numbness Derm:  No itching, no rash or sores.  Endocrine:  No sweats or chills.  No polyuria or dysuria Immunization:  Did not inquire as to previous immunizations. Travel:  None beyond local counties in last few months.    PHYSICAL EXAM: Vital signs in last 24 hours: Vitals:   11/06/16 0615 11/06/16 0617  BP: 140/76   Pulse: 99   Resp: (!) 37   Temp:  (!) 100.7 F (38.2 C)  SpO2: 91%    Wt Readings from Last 3 Encounters:  11/06/16 90.7 kg (200 lb)  05/28/12 93.9 kg (207 lb)    General: Ill appearing, obese WF on BiPAP. Currently appears comfortable. Head:  No asymmetry or swelling. No signs of head trauma.  Eyes:  No scleral icterus, no conjunctival pallor. Ears:  Not hard of hearing  Nose:  BiPAP in place Mouth:  BiPAP in place  covering the mouth Neck:  No JVD, no masses, no thyromegaly. Lungs:  Bibasilar crackles. Nonlabored breathing on BiPAP. Heart: Tachycardia, irregular. No MRG. Abdomen:  Soft., Bowel sounds quiet. Marketed tenderness in the epigastrium and right upper quadrant.   Rectal: Deferred.   Musc/Skeltl: No joint erythema, gross deformities or swelling. Extremities:  Patient is  obese. She has adiposity on the lower legs and feet, I don't think this is edema.  Neurologic:  Oriented to hospital, year, situation. Follows commands. No asterixis. Moves all 4 limbs, limb strength not tested. Skin:  No jaundice. No telangiectasia. No suspicious lesions or rashes on incomplete dermatologic survey Nodes:  No cervical or inguinal adenopathy.   Psych:  Calm, cooperative.  Intake/Output from previous day: 08/30 0701 - 08/31 0700 In: 150 [IV Piggyback:150] Out: -  Intake/Output this shift: No intake/output data recorded.  LAB RESULTS:  Recent Labs  11/06/16 0223  WBC 10.0  HGB 12.2  HCT 37.8  PLT 256   BMET Lab Results  Component Value Date   NA 136 11/06/2016   K 2.6 (LL) 11/06/2016   CL 95 (L) 11/06/2016   CO2 27 11/06/2016   GLUCOSE 251 (H) 11/06/2016   BUN 25 (H) 11/06/2016   CREATININE 1.33 (H) 11/06/2016   CALCIUM 8.8 (L) 11/06/2016   LFT  Recent Labs  11/06/16 0223  PROT 7.3  ALBUMIN 3.2*  AST 246*  ALT 163*  ALKPHOS 399*  BILITOT 2.5*   PT/INR No results found for: INR, PROTIME Hepatitis Panel No results for input(s): HEPBSAG, HCVAB, HEPAIGM, HEPBIGM in the last 72 hours. C-Diff No components found for: CDIFF Lipase     Component Value Date/Time   LIPASE 28 11/06/2016 0223    Drugs of Abuse  No results found for: LABOPIA, COCAINSCRNUR, LABBENZ, AMPHETMU, THCU, LABBARB   RADIOLOGY STUDIES: Ct Abdomen Pelvis W Contrast  Result Date: 11/06/2016 CLINICAL DATA:  Epigastric pain for 1 week. Fever. Suspect infection. History of hypertension and diabetes. EXAM: CT  ABDOMEN AND PELVIS WITH CONTRAST TECHNIQUE: Multidetector CT imaging of the abdomen and pelvis was performed using the standard protocol following bolus administration of intravenous contrast. CONTRAST:  175m ISOVUE-300 IOPAMIDOL (ISOVUE-300) INJECTION 61% COMPARISON:  Abdominal ultrasound November 06, 2016 at 0458 hours FINDINGS: LOWER CHEST: 10 mm RIGHT lower lobe pulmonary nodule (axial 2/66, series 5). Hazy ground-glass opacities with interlobular septal thickening and atelectasis. The heart is mildly enlarged. Mild coronary artery calcifications. No pericardial effusion. Retained versus refluxed contrast in distal esophagus. HEPATOBILIARY: Distended gallbladder. Minimal intrahepatic biliary dilatation. The liver is 24 cm in cranial caudad dimension . Common bile duct is 10 mm without choledocholithiasis. PANCREAS: 11 mm cystic mass in tail of the pancreas, pancreas is otherwise unremarkable. SPLEEN: Normal. ADRENALS/URINARY TRACT: Kidneys are orthotopic, demonstrating symmetric enhancement. 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 Multifocal scarring in too small to characterize hypodensities bilateral kidneys. No nephrolithiasis or hydronephrosis. 12 mm cyst upper pole LEFT kidney. The unopacified ureters are normal in course and caliber. Delayed imaging through the kidneys demonstrates symmetric prompt contrast excretion within the proximal urinary collecting system. Urinary bladder is partially distended and unremarkable. Normal adrenal glands. STOMACH/BOWEL: The stomach, small and large bowel are normal in course and caliber without inflammatory changes, sensitivity decreased without oral contrast. Moderate descending and sigmoid colonic diverticulosis. The appendix is not discretely identified, however there are no inflammatory changes in the right lower quadrant. VASCULAR/LYMPHATIC: Aortoiliac vessels are normal in course and caliber. Mild  calcific atherosclerosis. No lymphadenopathy by CT size criteria. REPRODUCTIVE: Normal. OTHER: No intraperitoneal free fluid or free air. MUSCULOSKELETAL: Expanded LEFT anterior rib (image 1/ 66), possible old fracture. Osteopenia. Multilevel moderate to severe degenerative change of the spine. IMPRESSION: 1. Distended gallbladder with mild biliary dilatation. Findings seen with cholecystitis or, possibly cholangitis. 2.  Hepatomegaly. 3. Mild cardiomegaly and suspected pulmonary edema. Recommend chest radiograph. 4. **An incidental finding of potential clinical significance has been found. 10 mm RIGHT lower lobe pulmonary nodule. Consider CT, PET-CT or tissue sampling at 3 months. This recommendation follows the consensus statement: Guidelines for Management of Small Pulmonary Nodules Detected on CT Scans: A Statement from the [employer] as published in Radiology 2005; 237:395-400. ** 5. **An incidental finding of potential clinical significance has been found. 11 mm cystic mass tail of pancreas. Recommend re- imaging at 2 years. This recommendation follows ACR consensus guidelines: Management of Incidental Pancreatic Cysts: A White Paper of the ACR Incidental Findings Committee. [place] [id number];69:[id number].** Aortic Atherosclerosis (ICD10-I70.0). Electronically Signed   By: Elon Alas M.D.   On: 11/06/2016 06:13   Dg Chest Portable 1 View  Result Date: 11/06/2016 CLINICAL DATA:  Shortness of breath. EXAM: PORTABLE CHEST 1 VIEW COMPARISON:  No prior. FINDINGS: Cardiomegaly with pulmonary venous congestion and diffuse bilateral pulmonary infiltrates consistent with pulmonary edema. No pleural effusion or pneumothorax. IMPRESSION: Cardiomegaly with pulmonary venous congestion and diffuse bilateral pulmonary infiltrates consistent with pulmonary edema . Electronically Signed   By: Marcello Moores  Register   On: 11/06/2016 06:40   US Abdomen Limited Ruq  Result Date: 11/06/2016 CLINICAL DATA:   Abnormal liver function tests. EXAM: ULTRASOUND ABDOMEN LIMITED RIGHT UPPER QUADRANT COMPARISON:  None. FINDINGS: Gallbladder: There are small calculi measuring a few mm each. There is a small volume intraluminal sludge. The patient was not tender to probe pressure over the gallbladder. There is mild mural thickening, 4.8 mm. No pericholecystic fluid. Common bile duct: Diameter: Upper normal, 7.2 mm. Liver: Diffuse echogenicity of hepatic parenchyma without focal lesion. Portal vein is patent on color Doppler imaging with normal direction of blood flow towards the liver. IMPRESSION: 1. Cholelithiasis. Slight gallbladder mural thickening. No tenderness over the gallbladder. 2. Increased echogenicity of hepatic parenchyma may represent fatty infiltration or hepatocellular disease. Electronically Signed   By: Andreas Newport M.D.   On: 11/06/2016 05:10     IMPRESSION:   *  Acute cholecystitis.  ? Cholangitis.  Perc cholecystostomy planned.    *  Acut resp failure: flash pulm edema in setting of sepsis, new onset Afib with RVR.  On Bipap.  Echo pending.   *  AKI, baseline CKD stage 3.  Hypokalmia, Hypomagnesemia.  *  Mass in tail of pancreas.  This was not seen on CT scan in 02/2016.  *  Right lower lobe lung nodule.  New finding.  *  Hepatomegaly, fatty liver versus hepatocellular disease. This is new compared with CT performed in 02/2016.  *  DM type 2.  Not on insulin at home.  *  Hx GERD and colon polyps (type unknown).  EGD, Colonoscopy in 2015.     PLAN:     *  Agree with plan for percutaneous cholecystostomy tube.  Continue Zosyn. Workup of pancreatic mass in future.  Will add CA 19-9 to next lab draw.     Azucena Freed  11/06/2016, 8:51 AM Pager: [phone]

## 2016-11-06 NOTE — ED Notes (Signed)
Potassium 2.6, Rancour MD aware

## 2016-11-06 NOTE — ED Provider Notes (Signed)
[facility] DEPT Provider Note   CSN: [id number] Arrival date & time: 11/06/16  0202     History   Chief Complaint Chief Complaint  Patient presents with  . Abdominal Pain    HPI Tara Serrano is a 71 y.o. female.  Patient with history of diabetes presenting with 1 week history of upper abdominal pain associated with nausea and poor appetite. She is uncomfortable. She has severe pain in her right upper quadrant and epigastrium. Pain is coming and going all week but became worse tonight. Has had nausea and a couple episodes of vomiting but none today. Reports fever to 102 2 days ago. Denies diarrhea. Denies chest pain or shortness of breath. Denies any  urinary symptoms. No vaginal bleeding or discharge cough or fever.    The history is provided by the patient and the spouse.  Abdominal Pain   Associated symptoms include fever, nausea and vomiting. Pertinent negatives include diarrhea, headaches, arthralgias and myalgias.    Past Medical History:  Diagnosis Date  . Diabetes mellitus without complication ([place])   . GERD (gastroesophageal reflux disease)   . Hypertension     There are no active problems to display for this patient.   History reviewed. No pertinent surgical history.  OB History    No data available       Home Medications    Prior to Admission medications   Medication Sig Start Date End Date Taking? Authorizing Provider  carvedilol (COREG) 25 MG tablet Take 25 mg by mouth 2 (two) times daily with a meal.    [provider]  furosemide (LASIX) 20 MG tablet Take 20 mg by mouth 2 (two) times daily.    [provider]  glimepiride (AMARYL) 4 MG tablet Take 8 mg by mouth daily before breakfast.    [provider]  HYDROcodone-acetaminophen (NORCO) 5-325 MG per tablet Take 2 tablets by mouth every 4 (four) hours as needed for pain. 05/28/12   Veryl Speak, MD  losartan (COZAAR) 100 MG tablet Take 100 mg by mouth daily.     [provider]  NIFEdipine (ADALAT CC) 90 MG 24 hr tablet Take 90 mg by mouth daily.    [provider]  RABEprazole (ACIPHEX) 20 MG tablet Take 20 mg by mouth daily.    [provider]  sitaGLIPtin (JANUVIA) 100 MG tablet Take 100 mg by mouth daily.    [provider]    Family History No family history on file.  Social History Social History  Substance Use Topics  . Smoking status: Never Smoker  . Smokeless tobacco: Not on file  . Alcohol use No     Allergies   Statins   Review of Systems Review of Systems  Constitutional: Positive for activity change, appetite change, chills and fever.  Respiratory: Negative for cough, chest tightness and shortness of breath.   Cardiovascular: Negative for chest pain.  Gastrointestinal: Positive for abdominal pain, nausea and vomiting. Negative for diarrhea.  Genitourinary: Negative for vaginal bleeding and vaginal discharge.  Musculoskeletal: Negative for arthralgias and myalgias.  Skin: Negative for rash.  Neurological: Negative for dizziness, weakness and headaches.    all other systems are negative except as noted in the HPI and PMH.    Physical Exam Updated Vital Signs BP (!) 115/57 (BP Location: Right Arm)   Pulse 70   Temp 98.2 F (36.8 C) (Oral)   Resp 20   Ht 5' (1.524 m)   Wt 90.7 kg (200 lb)  SpO2 97%   BMI 39.06 kg/m   Physical Exam  Constitutional: She is oriented to person, place, and time. She appears well-developed and well-nourished. No distress.  uncomfortable  HENT:  Head: Normocephalic and atraumatic.  Mouth/Throat: Oropharynx is clear and moist. No oropharyngeal exudate.  Eyes: Pupils are equal, round, and reactive to light. Conjunctivae and EOM are normal.  Neck: Normal range of motion. Neck supple.  No meningismus.  Cardiovascular: Normal rate, normal heart sounds and intact distal pulses.   No murmur heard. Irregular rhythm  Pulmonary/Chest: Effort normal and  breath sounds normal. No respiratory distress.  Abdominal: Soft. There is tenderness. There is guarding. There is no rebound.  TTP RUQ and epigastrium with voluntary guarding.  Musculoskeletal: Normal range of motion. She exhibits no edema or tenderness.  Neurological: She is alert and oriented to person, place, and time. No cranial nerve deficit. She exhibits normal muscle tone. Coordination normal.   5/5 strength throughout. CN 2-12 intact.Equal grip strength.   Skin: Skin is warm.  Psychiatric: She has a normal mood and affect. Her behavior is normal.  Nursing note and vitals reviewed.    ED Treatments / Results  Labs (all labs ordered are listed, but only abnormal results are displayed) Labs Reviewed  COMPREHENSIVE METABOLIC PANEL - Abnormal; Notable for the following:       Result Value   Potassium 2.6 (*)    Chloride 95 (*)    Glucose, Bld 251 (*)    BUN 25 (*)    Creatinine, Ser 1.33 (*)    Calcium 8.8 (*)    Albumin 3.2 (*)    AST 246 (*)    ALT 163 (*)    Alkaline Phosphatase 399 (*)    Total Bilirubin 2.5 (*)    GFR calc non Af Amer 39 (*)    GFR calc Af Amer 45 (*)    All other components within normal limits  MAGNESIUM - Abnormal; Notable for the following:    Magnesium 1.6 (*)    All other components within normal limits  CULTURE, BLOOD (ROUTINE X 2)  CULTURE, BLOOD (ROUTINE X 2)  LIPASE, BLOOD  CBC  URINALYSIS, ROUTINE W REFLEX MICROSCOPIC  I-STAT TROPONIN, ED  I-STAT CG4 LACTIC ACID, ED    EKG  EKG Interpretation  Date/Time:  Friday November 06 2016 02:04:30 EDT Ventricular Rate:  72 PR Interval:    QRS Duration: 152 QT Interval:  432 QTC Calculation: 473 R Axis:   91 Text Interpretation:  Atrial fibrillation Right bundle branch block T wave abnormality, consider inferolateral ischemia Abnormal ECG No previous ECGs available Confirmed by Ezequiel Essex [phone]) on 11/06/2016 4:36:32 AM Also confirmed by Ezequiel Essex [phone]), editor Oswaldo Milian,  [place] ([id number])  on 11/06/2016 6:55:55 AM       Radiology Ct Abdomen Pelvis W Contrast  Result Date: 11/06/2016 CLINICAL DATA:  Epigastric pain for 1 week. Fever. Suspect infection. History of hypertension and diabetes. EXAM: CT ABDOMEN AND PELVIS WITH CONTRAST TECHNIQUE: Multidetector CT imaging of the abdomen and pelvis was performed using the standard protocol following bolus administration of intravenous contrast. CONTRAST:  189mL ISOVUE-300 IOPAMIDOL (ISOVUE-300) INJECTION 61% COMPARISON:  Abdominal ultrasound November 06, 2016 at 0458 hours FINDINGS: LOWER CHEST: 10 mm RIGHT lower lobe pulmonary nodule (axial 2/66, series 5). Hazy ground-glass opacities with interlobular septal thickening and atelectasis. The heart is mildly enlarged. Mild coronary artery calcifications. No pericardial effusion. Retained versus refluxed contrast in distal esophagus. HEPATOBILIARY: Distended gallbladder. Minimal intrahepatic biliary dilatation.  The liver is 24 cm in cranial caudad dimension . Common bile duct is 10 mm without choledocholithiasis. PANCREAS: 11 mm cystic mass in tail of the pancreas, pancreas is otherwise unremarkable. SPLEEN: Normal. ADRENALS/URINARY TRACT: Kidneys are orthotopic, demonstrating symmetric enhancement. 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 Multifocal scarring in too small to characterize hypodensities bilateral kidneys. No nephrolithiasis or hydronephrosis. 12 mm cyst upper pole LEFT kidney. The unopacified ureters are normal in course and caliber. Delayed imaging through the kidneys demonstrates symmetric prompt contrast excretion within the proximal urinary collecting system. Urinary bladder is partially distended and unremarkable. Normal adrenal glands. STOMACH/BOWEL: The stomach, small and large bowel are normal in course and caliber without inflammatory changes, sensitivity decreased without oral contrast. Moderate descending  and sigmoid colonic diverticulosis. The appendix is not discretely identified, however there are no inflammatory changes in the right lower quadrant. VASCULAR/LYMPHATIC: Aortoiliac vessels are normal in course and caliber. Mild calcific atherosclerosis. No lymphadenopathy by CT size criteria. REPRODUCTIVE: Normal. OTHER: No intraperitoneal free fluid or free air. MUSCULOSKELETAL: Expanded LEFT anterior rib (image 1/ 66), possible old fracture. Osteopenia. Multilevel moderate to severe degenerative change of the spine. IMPRESSION: 1. Distended gallbladder with mild biliary dilatation. Findings seen with cholecystitis or, possibly cholangitis. 2. Hepatomegaly. 3. Mild cardiomegaly and suspected pulmonary edema. Recommend chest radiograph. 4. **An incidental finding of potential clinical significance has been found. 10 mm RIGHT lower lobe pulmonary nodule. Consider CT, PET-CT or tissue sampling at 3 months. This recommendation follows the consensus statement: Guidelines for Management of Small Pulmonary Nodules Detected on CT Scans: A Statement from the [place] as published in Radiology 2005; 237:395-400. ** 5. **An incidental finding of potential clinical significance has been found. 11 mm cystic mass tail of pancreas. Recommend re- imaging at 2 years. This recommendation follows ACR consensus guidelines: Management of Incidental Pancreatic Cysts: A White Paper of the ACR Incidental Findings Committee. [place] 7371;06:269-485.** Aortic Atherosclerosis (ICD10-I70.0). Electronically Signed   By: Elon Alas M.D.   On: 11/06/2016 06:13   Dg Chest Portable 1 View  Result Date: 11/06/2016 CLINICAL DATA:  Shortness of breath. EXAM: PORTABLE CHEST 1 VIEW COMPARISON:  No prior. FINDINGS: Cardiomegaly with pulmonary venous congestion and diffuse bilateral pulmonary infiltrates consistent with pulmonary edema. No pleural effusion or pneumothorax. IMPRESSION: Cardiomegaly with pulmonary venous  congestion and diffuse bilateral pulmonary infiltrates consistent with pulmonary edema . Electronically Signed   By: Marcello Moores  Register   On: 11/06/2016 06:40   US Abdomen Limited Ruq  Result Date: 11/06/2016 CLINICAL DATA:  Abnormal liver function tests. EXAM: ULTRASOUND ABDOMEN LIMITED RIGHT UPPER QUADRANT COMPARISON:  None. FINDINGS: Gallbladder: There are small calculi measuring a few mm each. There is a small volume intraluminal sludge. The patient was not tender to probe pressure over the gallbladder. There is mild mural thickening, 4.8 mm. No pericholecystic fluid. Common bile duct: Diameter: Upper normal, 7.2 mm. Liver: Diffuse echogenicity of hepatic parenchyma without focal lesion. Portal vein is patent on color Doppler imaging with normal direction of blood flow towards the liver. IMPRESSION: 1. Cholelithiasis. Slight gallbladder mural thickening. No tenderness over the gallbladder. 2. Increased echogenicity of hepatic parenchyma may represent fatty infiltration or hepatocellular disease. Electronically Signed   By: Andreas Newport M.D.   On: 11/06/2016 05:10    Procedures Procedures (including critical care time)  Medications Ordered in ED Medications  potassium chloride SA (K-DUR,KLOR-CON) CR tablet 40 mEq (not administered)  potassium  chloride 10 mEq in 100 mL IVPB (not administered)  fentaNYL (SUBLIMAZE) injection 50 mcg (not administered)  ondansetron (ZOFRAN) injection 4 mg (not administered)  magnesium sulfate IVPB 2 g 50 mL (not administered)     Initial Impression / Assessment and Plan / ED Course  I have reviewed the triage vital signs and the nursing notes.  Pertinent labs & imaging results that were available during my care of the patient were reviewed by me and considered in my medical decision making (see chart for details).     Patient with right upper quadrant pain, nausea, poor appetite. Triaged labs with elevated LFTs. She is very tender in the right upper  quadrant.  Found to be in atrial fibrillation without history of the same.  Lipase normal. LFTs elevated. WBC normal. Febrile in the ED. Hypokalemic.   US shows multiple gallstones. No pericholecystic fluid. CT with gallstones, distended gallbladder, dilated CBD. Will treat for possible cholangitis.  Zosyn started.  D/w Dr. Redmond Pulling of surgery who agrees with medical admit. D/w Dr. Henrene Pastor of GI who will evaluate.   Throughout ED stay, patient with more hypoxia and increased work of breathing.  Received minimal fluid.  CXR with edema. Lasix given, placed on bipap. A fib is new, but rate is controlled. Patient denies history of CHF. ABG without significant CO2 retention.   Patient and family updated. Admission to ICU d/w Dr. Chase Caller.  CRITICAL CARE Performed by: Ezequiel Essex Total critical care time: 60 minutes Critical care time was exclusive of separately billable procedures and treating other patients. Critical care was necessary to treat or prevent imminent or life-threatening deterioration. Critical care was time spent personally by me on the following activities: development of treatment plan with patient and/or surrogate as well as nursing, discussions with consultants, evaluation of patient's response to treatment, examination of patient, obtaining history from patient or surrogate, ordering and performing treatments and interventions, ordering and review of laboratory studies, ordering and review of radiographic studies, pulse oximetry and re-evaluation of patient's condition.  Final Clinical Impressions(s) / ED Diagnoses   Final diagnoses:  Elevated LFTs  Cholangitis  Acute pulmonary edema (HCC)  Atrial fibrillation, unspecified type [facility])    New Prescriptions New Prescriptions   No medications on file     Ezequiel Essex, MD 11/06/16 1001

## 2016-11-06 NOTE — Progress Notes (Signed)
Full consultation to follow.  Patient needs percutaneous drainage of GB.  I think this is severe acute cholecystitis, could be cholangitis but no CBD stones seen on CT or ultrasound.  CBD mildly to moderately dilated.   In flash pulmonary edema and respiratory failure.  Not a candidate for surgery at this time.  Kathryne Eriksson. Dahlia Bailiff, MD, [place] [phone] [facility] Surgery

## 2016-11-06 NOTE — ED Triage Notes (Signed)
Poor historian and vague about symptoms. States "Tara Serrano been sick all week" but will not elaborate. Pt reports pain to epigastric region.

## 2016-11-06 NOTE — Consult Note (Signed)
Reason for Consult:  Cholelithiasis/cholecystitis Referring Physician: S Rancour PCP: Patient, No Pcp Per  CC:  Abdominal pain, nausea and poor appetite   Tara Serrano is an 71 y.o. female.  HPI: 71 y/o female came to the ED complaining of being sick for a week. She was apparently vague about symptoms but had RUQ pain.  She presented to the Ed around 2 AM.    Work up shows temp up to 100.7, K+ 2.6, glucose 251, Creatinine 1.33, Mag 1.6, Alk phos 399, AST 246, ALT 163, T. Bil 2.5. Lactate 2.10=>> 4.09.  WBC 10K, H/H 12/38.  [place]: small gallstones/sludge, mild GB wall thickening, no pericholecuystic fluid, CBD 7.2 mm, ? Fatty liver dz.    CT scah show:  Distended GB with minimal intrahepatic biliary dilitation, liver is 24 cm, 33m cystic mass tail fo the pancreas.  Hepatomegaly, cardiomegaly ? Pulmonary edema  ? 10 mm RLL pulmonary nodule CXR:  CM with pulmonary venous congestion/diffuse bilateral pulmonary infiltrates consistent with flash pulmonary edema.   Past Medical History:  Diagnosis Date  . Diabetes mellitus without complication ([facility]   . GERD (gastroesophageal reflux disease)   . Hypertension     History reviewed. No pertinent surgical history.  No family history on file.  Social History:  reports that she has never smoked. She does not have any smokeless tobacco history on file. She reports that she does not drink alcohol or use drugs.  Allergies:  Allergies  Allergen Reactions  . Statins Other (See Comments)    Muscle problems    . sodium chloride    . piperacillin-tazobactam (ZOSYN)  IV 3.375 g (11/06/16 0647)  . potassium chloride 10 mEq (11/06/16 0629)  . sodium chloride     Prior to Admission medications   Medication Sig Start Date End Date Taking? Authorizing Provider  carvedilol (COREG) 25 MG tablet Take 25 mg by mouth 2 (two) times daily with a meal.    [provider]  furosemide (LASIX) 20 MG tablet Take 20 mg by mouth 2 (two) times daily.     [provider]  glimepiride (AMARYL) 4 MG tablet Take 8 mg by mouth daily before breakfast.    [provider]  HYDROcodone-acetaminophen (NORCO) 5-325 MG per tablet Take 2 tablets by mouth every 4 (four) hours as needed for pain. 05/28/12   DVeryl Speak MD  losartan (COZAAR) 100 MG tablet Take 100 mg by mouth daily.    [provider]  NIFEdipine (ADALAT CC) 90 MG 24 hr tablet Take 90 mg by mouth daily.    [provider]  RABEprazole (ACIPHEX) 20 MG tablet Take 20 mg by mouth daily.    [provider]  sitaGLIPtin (JANUVIA) 100 MG tablet Take 100 mg by mouth daily.    [provider]     Results for orders placed or performed during the hospital encounter of 11/06/16 (from the past 48 hour(s))  Lipase, blood     Status: None   Collection Time: 11/06/16  2:23 AM  Result Value Ref Range   Lipase 28 11 - 51 U/L  Comprehensive metabolic panel     Status: Abnormal   Collection Time: 11/06/16  2:23 AM  Result Value Ref Range   Sodium 136 135 - 145 mmol/L   Potassium 2.6 (LL) 3.5 - 5.1 mmol/L    Comment: CRITICAL RESULT CALLED TO, READ BACK BY AND VERIFIED WITH: PHILLIPS T,RN 11/06/16 0312 WAYK    Chloride 95 (L) 101 - 111  mmol/L   CO2 27 22 - 32 mmol/L   Glucose, Bld 251 (H) 65 - 99 mg/dL   BUN 25 (H) 6 - 20 mg/dL   Creatinine, Ser 1.33 (H) 0.44 - 1.00 mg/dL   Calcium 8.8 (L) 8.9 - 10.3 mg/dL   Total Protein 7.3 6.5 - 8.1 g/dL   Albumin 3.2 (L) 3.5 - 5.0 g/dL   AST 246 (H) 15 - 41 U/L   ALT 163 (H) 14 - 54 U/L   Alkaline Phosphatase 399 (H) 38 - 126 U/L   Total Bilirubin 2.5 (H) 0.3 - 1.2 mg/dL   GFR calc non Af Amer 39 (L) >60 mL/min   GFR calc Af Amer 45 (L) >60 mL/min    Comment: (NOTE) The eGFR has been calculated using the CKD EPI equation. This calculation has not been validated in all clinical situations. eGFR's persistently <60 mL/min signify possible Chronic Kidney Disease.    Anion gap 14 5 - 15  CBC     Status:  None   Collection Time: 11/06/16  2:23 AM  Result Value Ref Range   WBC 10.0 4.0 - 10.5 K/uL   RBC 4.31 3.87 - 5.11 MIL/uL   Hemoglobin 12.2 12.0 - 15.0 g/dL   HCT 37.8 36.0 - 46.0 %   MCV 87.7 78.0 - 100.0 fL   MCH 28.3 26.0 - 34.0 pg   MCHC 32.3 30.0 - 36.0 g/dL   RDW 13.6 11.5 - 15.5 %   Platelets 256 150 - 400 K/uL  I-Stat Troponin, ED (not at [facility])     Status: None   Collection Time: 11/06/16  2:36 AM  Result Value Ref Range   Troponin i, poc 0.01 0.00 - 0.08 ng/mL   Comment 3            Comment: Due to the release kinetics of cTnI, a negative result within the first hours of the onset of symptoms does not rule out myocardial infarction with certainty. If myocardial infarction is still suspected, repeat the test at appropriate intervals.   Magnesium     Status: Abnormal   Collection Time: 11/06/16  3:31 AM  Result Value Ref Range   Magnesium 1.6 (L) 1.7 - 2.4 mg/dL  I-Stat CG4 Lactic Acid, ED     Status: Abnormal   Collection Time: 11/06/16  4:23 AM  Result Value Ref Range   Lactic Acid, Venous 2.10 (HH) 0.5 - 1.9 mmol/L   Comment NOTIFIED PHYSICIAN   Urinalysis, Routine w reflex microscopic     Status: Abnormal   Collection Time: 11/06/16  6:14 AM  Result Value Ref Range   Color, Urine AMBER (A) YELLOW    Comment: BIOCHEMICALS MAY BE AFFECTED BY COLOR   APPearance HAZY (A) CLEAR   Specific Gravity, Urine 1.032 (H) 1.005 - 1.030   pH 5.0 5.0 - 8.0   Glucose, UA 50 (A) NEGATIVE mg/dL   Hgb urine dipstick NEGATIVE NEGATIVE   Bilirubin Urine NEGATIVE NEGATIVE   Ketones, ur NEGATIVE NEGATIVE mg/dL   Protein, ur 30 (A) NEGATIVE mg/dL   Nitrite NEGATIVE NEGATIVE   Leukocytes, UA NEGATIVE NEGATIVE   RBC / HPF 0-5 0 - 5 RBC/hpf   WBC, UA 6-30 0 - 5 WBC/hpf   Bacteria, UA NONE SEEN NONE SEEN   Squamous Epithelial / LPF 0-5 (A) NONE SEEN   Mucus PRESENT    Hyaline Casts, UA PRESENT   I-Stat CG4 Lactic Acid, ED     Status: Abnormal  Collection Time: 11/06/16  7:02 AM   Result Value Ref Range   Lactic Acid, Venous 4.09 (HH) 0.5 - 1.9 mmol/L   Comment NOTIFIED PHYSICIAN     Ct Abdomen Pelvis W Contrast  Result Date: 11/06/2016 CLINICAL DATA:  Epigastric pain for 1 week. Fever. Suspect infection. History of hypertension and diabetes. EXAM: CT ABDOMEN AND PELVIS WITH CONTRAST TECHNIQUE: Multidetector CT imaging of the abdomen and pelvis was performed using the standard protocol following bolus administration of intravenous contrast. CONTRAST:  1101m ISOVUE-300 IOPAMIDOL (ISOVUE-300) INJECTION 61% COMPARISON:  Abdominal ultrasound November 06, 2016 at 0458 hours FINDINGS: LOWER CHEST: 10 mm RIGHT lower lobe pulmonary nodule (axial 2/66, series 5). Hazy ground-glass opacities with interlobular septal thickening and atelectasis. The heart is mildly enlarged. Mild coronary artery calcifications. No pericardial effusion. Retained versus refluxed contrast in distal esophagus. HEPATOBILIARY: Distended gallbladder. Minimal intrahepatic biliary dilatation. The liver is 24 cm in cranial caudad dimension . Common bile duct is 10 mm without choledocholithiasis. PANCREAS: 11 mm cystic mass in tail of the pancreas, pancreas is otherwise unremarkable. SPLEEN: Normal. ADRENALS/URINARY TRACT: Kidneys are orthotopic, demonstrating symmetric enhancement. 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 Multifocal scarring in too small to characterize hypodensities bilateral kidneys. No nephrolithiasis or hydronephrosis. 12 mm cyst upper pole LEFT kidney. The unopacified ureters are normal in course and caliber. Delayed imaging through the kidneys demonstrates symmetric prompt contrast excretion within the proximal urinary collecting system. Urinary bladder is partially distended and unremarkable. Normal adrenal glands. STOMACH/BOWEL: The stomach, small and large bowel are normal in course and caliber without inflammatory changes, sensitivity  decreased without oral contrast. Moderate descending and sigmoid colonic diverticulosis. The appendix is not discretely identified, however there are no inflammatory changes in the right lower quadrant. VASCULAR/LYMPHATIC: Aortoiliac vessels are normal in course and caliber. Mild calcific atherosclerosis. No lymphadenopathy by CT size criteria. REPRODUCTIVE: Normal. OTHER: No intraperitoneal free fluid or free air. MUSCULOSKELETAL: Expanded LEFT anterior rib (image 1/ 66), possible old fracture. Osteopenia. Multilevel moderate to severe degenerative change of the spine. IMPRESSION: 1. Distended gallbladder with mild biliary dilatation. Findings seen with cholecystitis or, possibly cholangitis. 2. Hepatomegaly. 3. Mild cardiomegaly and suspected pulmonary edema. Recommend chest radiograph. 4. **An incidental finding of potential clinical significance has been found. 10 mm RIGHT lower lobe pulmonary nodule. Consider CT, PET-CT or tissue sampling at 3 months. This recommendation follows the consensus statement: Guidelines for Management of Small Pulmonary Nodules Detected on CT Scans: A Statement from the [employer] published in Radiology 2005; 237:395-400. ** 5. **An incidental finding of potential clinical significance has been found. 11 mm cystic mass tail of pancreas. Recommend re- imaging at 2 years. This recommendation follows ACR consensus guidelines: Management of Incidental Pancreatic Cysts: A White Paper of the ACR Incidental Findings Committee. [id number];[id number]** Aortic Atherosclerosis (ICD10-I70.0). Electronically Signed   By: CElon AlasM.D.   On: 11/06/2016 06:13   Dg Chest Portable 1 View  Result Date: 11/06/2016 CLINICAL DATA:  Shortness of breath. EXAM: PORTABLE CHEST 1 VIEW COMPARISON:  No prior. FINDINGS: Cardiomegaly with pulmonary venous congestion and diffuse bilateral pulmonary infiltrates consistent with pulmonary edema. No pleural effusion or pneumothorax.  IMPRESSION: Cardiomegaly with pulmonary venous congestion and diffuse bilateral pulmonary infiltrates consistent with pulmonary edema . Electronically Signed   By: TMarcello Moores Register   On: 11/06/2016 06:40   [place] Limited Ruq  Result Date: 11/06/2016 CLINICAL DATA:  Abnormal liver  function tests. EXAM: ULTRASOUND ABDOMEN LIMITED RIGHT UPPER QUADRANT COMPARISON:  None. FINDINGS: Gallbladder: There are small calculi measuring a few mm each. There is a small volume intraluminal sludge. The patient was not tender to probe pressure over the gallbladder. There is mild mural thickening, 4.8 mm. No pericholecystic fluid. Common bile duct: Diameter: Upper normal, 7.2 mm. Liver: Diffuse echogenicity of hepatic parenchyma without focal lesion. Portal vein is patent on color Doppler imaging with normal direction of blood flow towards the liver. IMPRESSION: 1. Cholelithiasis. Slight gallbladder mural thickening. No tenderness over the gallbladder. 2. Increased echogenicity of hepatic parenchyma may represent fatty infiltration or hepatocellular disease. Electronically Signed   By: Andreas Newport M.D.   On: 11/06/2016 05:10    Review of Systems  Unable to perform ROS: Critical illness   Blood pressure 140/76, pulse 99, temperature (!) 100.7 F (38.2 C), temperature source Oral, resp. rate (!) 37, height 5' (1.524 m), weight 90.7 kg (200 lb), SpO2 91 %. Physical Exam  Constitutional: She appears distressed.  Obese female on BiPAP , tries to answer questions, but not really able to.    HENT:  Head: Normocephalic.  Bipap in place  Eyes: Right eye exhibits no discharge. Left eye exhibits discharge. Scleral icterus is present.  Pupils are equal  Neck: No tracheal deviation present.  Cardiovascular: Normal rate, regular rhythm and normal heart sounds.   No murmur heard. Respiratory: She is in respiratory distress. She has no wheezes. She has rales. She exhibits no tenderness.  GI: Soft. She exhibits  distension. She exhibits no mass. There is tenderness (marked tenderness Right side more acute RUQ). There is no rebound and no guarding.  No BS  Musculoskeletal: She exhibits no edema or tenderness.  Neurological: She is alert.  Skin: Skin is warm and dry. No rash noted. She is not diaphoretic. No erythema. No pallor.  Psychiatric:  On BiPAP, very uncomfortable, could not really assess.  Her husband, son and grandson are all here with her.    Assessment/Plan: Cholecystitis/cholelithiasis/ ? choledocholithiasis/elevated LFT's Acute respiratory distress/Pulmonary edema/- on Bipap Lactate elevated AODM  Hypokalemia/Hypomagnesemia Possible UTI Hx of hypertension  Plan:  Agree with antibiotics, IR drain of Gallbladder.  CCM admitting, for medical management.   Lin Glazier 11/06/2016, 7:16 AM

## 2016-11-07 ENCOUNTER — Inpatient Hospital Stay (HOSPITAL_COMMUNITY): Payer: Medicare Other

## 2016-11-07 ENCOUNTER — Encounter (HOSPITAL_COMMUNITY): Payer: Self-pay | Admitting: *Deleted

## 2016-11-07 DIAGNOSIS — R7989 Other specified abnormal findings of blood chemistry: Secondary | ICD-10-CM

## 2016-11-07 DIAGNOSIS — I481 Persistent atrial fibrillation: Secondary | ICD-10-CM

## 2016-11-07 DIAGNOSIS — K830 Cholangitis: Secondary | ICD-10-CM

## 2016-11-07 DIAGNOSIS — R948 Abnormal results of function studies of other organs and systems: Secondary | ICD-10-CM

## 2016-11-07 DIAGNOSIS — K8309 Other cholangitis: Secondary | ICD-10-CM

## 2016-11-07 DIAGNOSIS — I4891 Unspecified atrial fibrillation: Secondary | ICD-10-CM

## 2016-11-07 DIAGNOSIS — I5021 Acute systolic (congestive) heart failure: Secondary | ICD-10-CM

## 2016-11-07 LAB — HEPATIC FUNCTION PANEL
ALBUMIN: 2.5 g/dL — AB (ref 3.5–5.0)
ALT: 189 U/L — ABNORMAL HIGH (ref 14–54)
AST: 186 U/L — AB (ref 15–41)
Alkaline Phosphatase: 415 U/L — ABNORMAL HIGH (ref 38–126)
Bilirubin, Direct: 3.9 mg/dL — ABNORMAL HIGH (ref 0.1–0.5)
Indirect Bilirubin: 1.7 mg/dL — ABNORMAL HIGH (ref 0.3–0.9)
Total Bilirubin: 5.6 mg/dL — ABNORMAL HIGH (ref 0.3–1.2)
Total Protein: 6.5 g/dL (ref 6.5–8.1)

## 2016-11-07 LAB — GLUCOSE, CAPILLARY
GLUCOSE-CAPILLARY: 159 mg/dL — AB (ref 65–99)
GLUCOSE-CAPILLARY: 84 mg/dL (ref 65–99)
GLUCOSE-CAPILLARY: 81 mg/dL (ref 65–99)
Glucose-Capillary: 130 mg/dL — ABNORMAL HIGH (ref 65–99)
Glucose-Capillary: 99 mg/dL (ref 65–99)
Glucose-Capillary: 90 mg/dL (ref 65–99)

## 2016-11-07 LAB — CBC
HCT: 33.2 % — ABNORMAL LOW (ref 36.0–46.0)
Hemoglobin: 10.7 g/dL — ABNORMAL LOW (ref 12.0–15.0)
MCH: 28 pg (ref 26.0–34.0)
MCHC: 32.2 g/dL (ref 30.0–36.0)
MCV: 86.9 fL (ref 78.0–100.0)
PLATELETS: 208 10*3/uL (ref 150–400)
RBC: 3.82 MIL/uL — ABNORMAL LOW (ref 3.87–5.11)
RDW: 14.3 % (ref 11.5–15.5)
WBC: 15.6 10*3/uL — AB (ref 4.0–10.5)

## 2016-11-07 LAB — ECHOCARDIOGRAM COMPLETE
Height: 60 in
Weight: 3033.53 oz

## 2016-11-07 LAB — PROCALCITONIN: PROCALCITONIN: 6.98 ng/mL

## 2016-11-07 LAB — BASIC METABOLIC PANEL
Anion gap: 13 (ref 5–15)
BUN: 27 mg/dL — AB (ref 6–20)
CALCIUM: 8.1 mg/dL — AB (ref 8.9–10.3)
CO2: 29 mmol/L (ref 22–32)
CREATININE: 1.49 mg/dL — AB (ref 0.44–1.00)
Chloride: 98 mmol/L — ABNORMAL LOW (ref 101–111)
GFR calc Af Amer: 40 mL/min — ABNORMAL LOW (ref >60)
GFR calc non Af Amer: 34 mL/min — ABNORMAL LOW (ref >60)
Glucose, Bld: 172 mg/dL — ABNORMAL HIGH (ref 65–99)
Potassium: 3.1 mmol/L — ABNORMAL LOW (ref 3.5–5.1)
SODIUM: 140 mmol/L (ref 135–145)

## 2016-11-07 LAB — MAGNESIUM: Magnesium: 2 mg/dL (ref 1.7–2.4)

## 2016-11-07 LAB — PROTIME-INR
INR: 1.23
Prothrombin Time: 15.4 seconds — ABNORMAL HIGH (ref 11.4–15.2)

## 2016-11-07 LAB — CANCER ANTIGEN 19-9: CA 19-9: 436 U/mL — ABNORMAL HIGH (ref 0–35)

## 2016-11-07 LAB — PHOSPHORUS: PHOSPHORUS: 5.7 mg/dL — AB (ref 2.5–4.6)

## 2016-11-07 LAB — APTT: aPTT: 36 seconds (ref 24–36)

## 2016-11-07 MED ORDER — PERFLUTREN LIPID MICROSPHERE
1.0000 mL | INTRAVENOUS | Status: AC | PRN
  Administered 2016-11-07: 2 mL via INTRAVENOUS
  Filled 2016-11-07: qty 10

## 2016-11-07 MED ORDER — INSULIN ASPART 100 UNIT/ML ~~LOC~~ SOLN: 0.0000 [IU] | SUBCUTANEOUS | Status: DC

## 2016-11-07 MED ORDER — POTASSIUM CHLORIDE 10 MEQ/100ML IV SOLN
10.0000 meq | INTRAVENOUS | Status: AC
  Administered 2016-11-07 (×3): 10 meq via INTRAVENOUS
  Filled 2016-11-07 (×3): qty 100

## 2016-11-07 MED ORDER — SODIUM CHLORIDE 0.9 % IV SOLN
250.0000 mL | INTRAVENOUS | Status: DC | PRN
  Administered 2016-11-07: 250 mL via INTRAVENOUS

## 2016-11-07 MED ORDER — SODIUM CHLORIDE 0.9 % IV SOLN: INTRAVENOUS | Status: DC

## 2016-11-07 NOTE — Progress Notes (Signed)
Chief Complaint: Patient was seen in consultation today for abdominal pain  Referring Physician(s):  Dr. Georganna Skeans  Supervising Physician: Arne Cleveland  Patient Status: [facility] - In-pt  History of Present Illness: Tara Serrano is a 71 y.o. female with past medical history of DM, GERD, and HTN who presented to [facility] with abdominal pain and concern for cholecystitis.   CT Abd/Pelvis 8/31 showed: 1. Distended gallbladder with mild biliary dilatation. Findings seen with cholecystitis or, possibly cholangitis. 2. Hepatomegaly. 3. Mild cardiomegaly and suspected pulmonary edema. Recommend chest radiograph.  HIDA scan 8/31 showed: No excretion of tracer into the biliary tree up to 2 hours after injection. This can be seen with severe hepatocellular disease or complete common bile duct obstruction. Delayed images will be obtained in morning on 11/07/2016 to help differentiate between these possibilities.  IR consulted for percutaneous cholecystostomy. Patient has been NPO.  She is not on blood thinners.    Past Medical History:  Diagnosis Date  . Diabetes mellitus without complication ([place])   . GERD (gastroesophageal reflux disease)   . Hypertension     History reviewed. No pertinent surgical history.  Allergies: Statins  Medications: Prior to Admission medications   Medication Sig Start Date End Date Taking? Authorizing Provider  carvedilol (COREG) 25 MG tablet Take 25 mg by mouth 2 (two) times daily with a meal.   Yes [provider]  furosemide (LASIX) 20 MG tablet Take 20 mg by mouth 2 (two) times daily.   Yes [provider]  glimepiride (AMARYL) 4 MG tablet Take 8 mg by mouth daily before breakfast.   Yes [provider]  losartan (COZAAR) 100 MG tablet Take 100 mg by mouth daily.   Yes [provider]  NIFEdipine (ADALAT CC) 90 MG 24 hr tablet Take 90 mg by mouth daily.   Yes [provider]  RABEprazole (ACIPHEX) 20  MG tablet Take 20 mg by mouth 2 (two) times daily.    Yes [provider]     No family history on file.  Social History   Social History  . Marital status: Married    Spouse name: N/A  . Number of children: N/A  . Years of education: N/A   Social History Main Topics  . Smoking status: Never Smoker  . Smokeless tobacco: Never Used  . Alcohol use No  . Drug use: No  . Sexual activity: Not Asked   Other Topics Concern  . None   Social History Narrative  . None    Review of Systems  Constitutional: Negative for fatigue and fever.  Respiratory: Positive for shortness of breath (improved since admission). Negative for cough.   Cardiovascular: Negative for chest pain.  Gastrointestinal: Positive for abdominal pain.  Psychiatric/Behavioral: Negative for behavioral problems and confusion.    Vital Signs: BP (!) 95/49   Pulse 75   Temp 97.9 F (36.6 C) (Oral)   Resp (!) 33   Ht 5' (1.524 m)   Wt 189 lb 9.5 oz (86 kg)   SpO2 (!) 89%   BMI 37.03 kg/m   Physical Exam  Constitutional: She is oriented to person, place, and time. She appears well-developed.  Cardiovascular: Normal heart sounds.   Irregular irregular rate and rhythm.   Pulmonary/Chest: Effort normal and breath sounds normal. No respiratory distress.  Abdominal: Soft. There is tenderness (RUQ).  Neurological: She is alert and oriented to person, place, and time.  Skin: Skin is warm and dry.  Psychiatric: She  has a normal mood and affect. Her behavior is normal. Judgment and thought content normal.  Nursing note and vitals reviewed.   Mallampati Score:  MD Evaluation Airway: WNL Heart: WNL Abdomen: WNL Chest/ Lungs: WNL ASA  Classification: 3 Mallampati/Airway Score: Two  Imaging: Nm Hepatobiliary Liver Func  Addendum Date: 11/07/2016   ADDENDUM REPORT: 11/07/2016 10:47 ADDENDUM: Additional planar images were obtained 1 day post radiopharmaceutical administration. There is no evidence of  radiotracer accumulation in the extrahepatic biliary tree or small bowel. Findings favor high-grade CBD obstruction, as was suggested on prior CT which demonstrated mild biliary dilatation. Correlate with laboratory studies. Consider ERCP for further evaluation and potential decompression. Electronically Signed   By: Lucrezia Europe M.D.   On: 11/07/2016 10:47   Result Date: 11/07/2016 CLINICAL DATA:  Cholecystitis cholangitis. Non-surgical candidate. Pre stent placement evaluation. EXAM: NUCLEAR MEDICINE HEPATOBILIARY IMAGING TECHNIQUE: Sequential images of the abdomen were obtained out to 60 minutes following intravenous administration of radiopharmaceutical. RADIOPHARMACEUTICALS:  5.1 mCi Tc-104m  Choletec IV COMPARISON:  Right upper quadrant abdomen ultrasound and abdomen pelvis CT obtained earlier today. FINDINGS: There is prompt uptake of tracer by the liver with no excreted tracer seen in the biliary tree up to 2 hours after injection. IMPRESSION: No excretion of tracer into the biliary tree up to 2 hours after injection. This can be seen with severe hepatocellular disease or complete common bile duct obstruction. Delayed images will be obtained in morning on 11/07/2016 to help differentiate between these possibilities. Electronically Signed: By: Claudie Revering M.D. On: 11/06/2016 18:52   Ct Abdomen Pelvis W Contrast  Result Date: 11/06/2016 CLINICAL DATA:  Epigastric pain for 1 week. Fever. Suspect infection. History of hypertension and diabetes. EXAM: CT ABDOMEN AND PELVIS WITH CONTRAST TECHNIQUE: Multidetector CT imaging of the abdomen and pelvis was performed using the standard protocol following bolus administration of intravenous contrast. CONTRAST:  16mL ISOVUE-300 IOPAMIDOL (ISOVUE-300) INJECTION 61% COMPARISON:  Abdominal ultrasound November 06, 2016 at 0458 hours FINDINGS: LOWER CHEST: 10 mm RIGHT lower lobe pulmonary nodule (axial 2/66, series 5). Hazy ground-glass opacities with interlobular septal  thickening and atelectasis. The heart is mildly enlarged. Mild coronary artery calcifications. No pericardial effusion. Retained versus refluxed contrast in distal esophagus. HEPATOBILIARY: Distended gallbladder. Minimal intrahepatic biliary dilatation. The liver is 24 cm in cranial caudad dimension . Common bile duct is 10 mm without choledocholithiasis. PANCREAS: 11 mm cystic mass in tail of the pancreas, pancreas is otherwise unremarkable. SPLEEN: Normal. ADRENALS/URINARY TRACT: Kidneys are orthotopic, demonstrating symmetric enhancement. 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 Multifocal scarring in too small to characterize hypodensities bilateral kidneys. No nephrolithiasis or hydronephrosis. 12 mm cyst upper pole LEFT kidney. The unopacified ureters are normal in course and caliber. Delayed imaging through the kidneys demonstrates symmetric prompt contrast excretion within the proximal urinary collecting system. Urinary bladder is partially distended and unremarkable. Normal adrenal glands. STOMACH/BOWEL: The stomach, small and large bowel are normal in course and caliber without inflammatory changes, sensitivity decreased without oral contrast. Moderate descending and sigmoid colonic diverticulosis. The appendix is not discretely identified, however there are no inflammatory changes in the right lower quadrant. VASCULAR/LYMPHATIC: Aortoiliac vessels are normal in course and caliber. Mild calcific atherosclerosis. No lymphadenopathy by CT size criteria. REPRODUCTIVE: Normal. OTHER: No intraperitoneal free fluid or free air. MUSCULOSKELETAL: Expanded LEFT anterior rib (image 1/ 66), possible old fracture. Osteopenia. Multilevel moderate to severe degenerative change of the spine. IMPRESSION: 1. Distended  gallbladder with mild biliary dilatation. Findings seen with cholecystitis or, possibly cholangitis. 2. Hepatomegaly. 3. Mild cardiomegaly and  suspected pulmonary edema. Recommend chest radiograph. 4. **An incidental finding of potential clinical significance has been found. 10 mm RIGHT lower lobe pulmonary nodule. Consider CT, PET-CT or tissue sampling at 3 months. This recommendation follows the consensus statement: Guidelines for Management of Small Pulmonary Nodules Detected on CT Scans: A Statement from the [employer] as published in Radiology 2005; 237:395-400. ** 5. **An incidental finding of potential clinical significance has been found. 11 mm cystic mass tail of pancreas. Recommend re- imaging at 2 years. This recommendation follows ACR consensus guidelines: Management of Incidental Pancreatic Cysts: A White Paper of the ACR Incidental Findings Committee. [address] 3419;[id number].** Aortic Atherosclerosis (ICD10-I70.0). Electronically Signed   By: Elon Alas M.D.   On: 11/06/2016 06:13   Dg Chest Port 1 View  Result Date: 11/07/2016 CLINICAL DATA:  Acute respiratory distress. EXAM: PORTABLE CHEST 1 VIEW COMPARISON:  One-view chest x-ray 11/06/2016. FINDINGS: The heart size is exaggerated by low lung volumes. Aeration of both lungs is improving. Persistent lower lobe airspace disease is noted, left greater than right. Mild pulmonary vascular congestion is noted as well. IMPRESSION: 1. Improving aeration. 2. Mild pulmonary vascular congestion remains. 3. Left greater than right residual basilar airspace disease. This is concerning for left lower lobe pneumonia. Electronically Signed   By: San Morelle M.D.   On: 11/07/2016 08:41   Dg Chest Portable 1 View  Result Date: 11/06/2016 CLINICAL DATA:  Shortness of breath. EXAM: PORTABLE CHEST 1 VIEW COMPARISON:  No prior. FINDINGS: Cardiomegaly with pulmonary venous congestion and diffuse bilateral pulmonary infiltrates consistent with pulmonary edema. No pleural effusion or pneumothorax. IMPRESSION: Cardiomegaly with pulmonary venous congestion and diffuse bilateral  pulmonary infiltrates consistent with pulmonary edema . Electronically Signed   By: Marcello Moores  Register   On: 11/06/2016 06:40   US Abdomen Limited Ruq  Result Date: 11/06/2016 CLINICAL DATA:  Abnormal liver function tests. EXAM: ULTRASOUND ABDOMEN LIMITED RIGHT UPPER QUADRANT COMPARISON:  None. FINDINGS: Gallbladder: There are small calculi measuring a few mm each. There is a small volume intraluminal sludge. The patient was not tender to probe pressure over the gallbladder. There is mild mural thickening, 4.8 mm. No pericholecystic fluid. Common bile duct: Diameter: Upper normal, 7.2 mm. Liver: Diffuse echogenicity of hepatic parenchyma without focal lesion. Portal vein is patent on color Doppler imaging with normal direction of blood flow towards the liver. IMPRESSION: 1. Cholelithiasis. Slight gallbladder mural thickening. No tenderness over the gallbladder. 2. Increased echogenicity of hepatic parenchyma may represent fatty infiltration or hepatocellular disease. Electronically Signed   By: Andreas Newport M.D.   On: 11/06/2016 05:10    Labs:  CBC:  Recent Labs  11/06/16 0223 11/07/16 0237  WBC 10.0 15.6*  HGB 12.2 10.7*  HCT 37.8 33.2*  PLT 256 208    COAGS:  Recent Labs  11/07/16 0237  INR 1.23  APTT 36    BMP:  Recent Labs  11/06/16 0223 11/07/16 0237  NA 136 140  K 2.6* 3.1*  CL 95* 98*  CO2 27 29  GLUCOSE 251* 172*  BUN 25* 27*  CALCIUM 8.8* 8.1*  CREATININE 1.33* 1.49*  GFRNONAA 39* 34*  GFRAA 45* 40*    LIVER FUNCTION TESTS:  Recent Labs  11/06/16 0223 11/07/16 0237  BILITOT 2.5* 5.6*  AST 246* 186*  ALT 163* 189*  ALKPHOS 399* 415*  PROT 7.3 6.5  ALBUMIN 3.2* 2.5*    TUMOR MARKERS: No results for input(s): AFPTM, CEA, CA199, CHROMGRNA in the last 8760 hours.  Assessment and Plan: Acute cholecystitis Patient with past medical history of HTN, GERD, DM presents with complaint of abdominal pain.  IR consulted for percutaneous cholecystostomy  at the request of Dr. Hulen Skains. Case reviewed by Dr. Vernard Gambles who has reviewed HIDA and additional planar images obtained 1 day after scan. Per Dr. Vernard Gambles: There is no evidence of radiotracer accumulation in the extrahepatic biliary tree or small bowel.  Findings favor high-grade CBD obstruction, as was suggested on prior CT which demonstrated mild biliary dilatation. Correlate with laboratory studies. Consider ERCP for further evaluation and potential decompression. GI aware of recommendations.    Thank you for this interesting consult.  I greatly enjoyed meeting Kynnedy Carreno and look forward to participating in their care.  A copy of this report was sent to the requesting provider on this date.  Electronically Signed: Docia Barrier, PA 11/07/2016, 12:57 PM   I spent a total of 40 Minutes    in face to face in clinical consultation, greater than 50% of which was counseling/coordinating care for cholecystitis

## 2016-11-07 NOTE — Progress Notes (Signed)
Dr. Elsworth Soho notified of patients drop in blood pressure and heart rate. Both decreasing throughout the night despite many attempts to arrouse the patient. Also made aware of Urine output being poor at 275 for the past 10 hours and creatinine 1.49. No furthers orders at this time. Will continue to monitor.

## 2016-11-07 NOTE — Progress Notes (Signed)
Daily Rounding Note  11/07/2016, 11:23 AM  LOS: 1 day   SUBJECTIVE:   Chief complaint: thirsty, wondering when she can start po.  Also hungry No nausea.  Minor RUQ and right thorax/scapular pain.      No BMs No SOB or chest pain.  Off Bipap, on nasal O2.  OBJECTIVE:         Vital signs in last 24 hours:    Temp:  [98.3 F (36.8 C)-100.4 F (38 C)] 98.6 F (37 C) (09/01 0815) Pulse Rate:  [63-109] 74 (09/01 1000) Resp:  [16-42] 30 (09/01 1000) BP: (86-139)/(45-71) 89/45 (09/01 1000) SpO2:  [85 %-99 %] 92 % (09/01 1000) Weight:  [86 kg (189 lb 9.5 oz)] 86 kg (189 lb 9.5 oz) (09/01 0327)   Filed Weights   11/06/16 0207 11/07/16 0327  Weight: 90.7 kg (200 lb) 86 kg (189 lb 9.5 oz)   General: looks soooo much better.  Not ill or toxic looking  Heart: irreg, irreg, rate not accelerated Chest: some rales in right base.  No labored breathing.  No cough Abdomen: soft, active BS.  ND.  Minor - moderate tenderness RUQ.  Extremities: no CCE Neuro/Psych:  Alert, appropriate.  Fully oriented.  Appropriate questions.   Intake/Output from previous day: 08/31 0701 - 09/01 0700 In: 411.2 [I.V.:11.2; IV Piggyback:400] Out: 1425 [Urine:1425]  Intake/Output this shift: Total I/O In: 40 [I.V.:40] Out: -   Lab Results:  Recent Labs  11/06/16 0223 11/07/16 0237  WBC 10.0 15.6*  HGB 12.2 10.7*  HCT 37.8 33.2*  PLT 256 208   BMET  Recent Labs  11/06/16 0223 11/07/16 0237  NA 136 140  K 2.6* 3.1*  CL 95* 98*  CO2 27 29  GLUCOSE 251* 172*  BUN 25* 27*  CREATININE 1.33* 1.49*  CALCIUM 8.8* 8.1*   LFT  Recent Labs  11/06/16 0223 11/07/16 0237  PROT 7.3 6.5  ALBUMIN 3.2* 2.5*  AST 246* 186*  ALT 163* 189*  ALKPHOS 399* 415*  BILITOT 2.5* 5.6*  BILIDIR  --  3.9*  IBILI  --  1.7*   PT/INR  Recent Labs  11/07/16 0237  LABPROT 15.4*  INR 1.23   Hepatitis Panel No results for input(s): HEPBSAG,  HCVAB, HEPAIGM, HEPBIGM in the last 72 hours.  Studies/Results: Nm Hepatobiliary Liver Func  Addendum Date: 11/07/2016   ADDENDUM REPORT: 11/07/2016 10:47 ADDENDUM: Additional planar images were obtained 1 day post radiopharmaceutical administration. There is no evidence of radiotracer accumulation in the extrahepatic biliary tree or small bowel. Findings favor high-grade CBD obstruction, as was suggested on prior CT which demonstrated mild biliary dilatation. Correlate with laboratory studies. Consider ERCP for further evaluation and potential decompression. Electronically Signed   By: Lucrezia Europe M.D.   On: 11/07/2016 10:47   Result Date: 11/07/2016 CLINICAL DATA:  Cholecystitis cholangitis. Non-surgical candidate. Pre stent placement evaluation. EXAM: NUCLEAR MEDICINE HEPATOBILIARY IMAGING TECHNIQUE: Sequential images of the abdomen were obtained out to 60 minutes following intravenous administration of radiopharmaceutical. RADIOPHARMACEUTICALS:  5.1 mCi Tc-71m Choletec IV COMPARISON:  Right upper quadrant abdomen ultrasound and abdomen pelvis CT obtained earlier today. FINDINGS: There is prompt uptake of tracer by the liver with no excreted tracer seen in the biliary tree up to 2 hours after injection. IMPRESSION: No excretion of tracer into the biliary tree up to 2 hours after injection. This can be seen with severe hepatocellular disease or complete common bile duct obstruction.  Delayed images will be obtained in morning on 11/07/2016 to help differentiate between these possibilities. Electronically Signed: By: Claudie Revering M.D. On: 11/06/2016 18:52   Ct Abdomen Pelvis W Contrast  Result Date: 11/06/2016 CLINICAL DATA:  Epigastric pain for 1 week. Fever. Suspect infection. History of hypertension and diabetes. EXAM: CT ABDOMEN AND PELVIS WITH CONTRAST TECHNIQUE: Multidetector CT imaging of the abdomen and pelvis was performed using the standard protocol following bolus administration of intravenous  contrast. CONTRAST:  158m ISOVUE-300 IOPAMIDOL (ISOVUE-300) INJECTION 61% COMPARISON:  Abdominal ultrasound November 06, 2016 at 0458 hours FINDINGS: LOWER CHEST: 10 mm RIGHT lower lobe pulmonary nodule (axial 2/66, series 5). Hazy ground-glass opacities with interlobular septal thickening and atelectasis. The heart is mildly enlarged. Mild coronary artery calcifications. No pericardial effusion. Retained versus refluxed contrast in distal esophagus. HEPATOBILIARY: Distended gallbladder. Minimal intrahepatic biliary dilatation. The liver is 24 cm in cranial caudad dimension . Common bile duct is 10 mm without choledocholithiasis. PANCREAS: 11 mm cystic mass in tail of the pancreas, pancreas is otherwise unremarkable. SPLEEN: Normal. ADRENALS/URINARY TRACT: Kidneys are orthotopic, demonstrating symmetric enhancement. 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 Multifocal scarring in too small to characterize hypodensities bilateral kidneys. No nephrolithiasis or hydronephrosis. 12 mm cyst upper pole LEFT kidney. The unopacified ureters are normal in course and caliber. Delayed imaging through the kidneys demonstrates symmetric prompt contrast excretion within the proximal urinary collecting system. Urinary bladder is partially distended and unremarkable. Normal adrenal glands. STOMACH/BOWEL: The stomach, small and large bowel are normal in course and caliber without inflammatory changes, sensitivity decreased without oral contrast. Moderate descending and sigmoid colonic diverticulosis. The appendix is not discretely identified, however there are no inflammatory changes in the right lower quadrant. VASCULAR/LYMPHATIC: Aortoiliac vessels are normal in course and caliber. Mild calcific atherosclerosis. No lymphadenopathy by CT size criteria. REPRODUCTIVE: Normal. OTHER: No intraperitoneal free fluid or free air. MUSCULOSKELETAL: Expanded LEFT anterior rib (image  1/ 66), possible old fracture. Osteopenia. Multilevel moderate to severe degenerative change of the spine. IMPRESSION: 1. Distended gallbladder with mild biliary dilatation. Findings seen with cholecystitis or, possibly cholangitis. 2. Hepatomegaly. 3. Mild cardiomegaly and suspected pulmonary edema. Recommend chest radiograph. 4. **An incidental finding of potential clinical significance has been found. 10 mm RIGHT lower lobe pulmonary nodule. Consider CT, PET-CT or tissue sampling at 3 months. This recommendation follows the consensus statement: Guidelines for Management of Small Pulmonary Nodules Detected on CT Scans: A Statement from the [employer] published in Radiology 2005; 237:395-400. ** 5. **An incidental finding of potential clinical significance has been found. 11 mm cystic mass tail of pancreas. Recommend re- imaging at 2 years. This recommendation follows ACR consensus guidelines: Management of Incidental Pancreatic Cysts: A White Paper of the ACR Incidental Findings Committee. [id number];67:124-580** Aortic Atherosclerosis (ICD10-I70.0). Electronically Signed   By: CElon AlasM.D.   On: 11/06/2016 06:13   Dg Chest Port 1 View  Result Date: 11/07/2016 CLINICAL DATA:  Acute respiratory distress. EXAM: PORTABLE CHEST 1 VIEW COMPARISON:  One-view chest x-ray 11/06/2016. FINDINGS: The heart size is exaggerated by low lung volumes. Aeration of both lungs is improving. Persistent lower lobe airspace disease is noted, left greater than right. Mild pulmonary vascular congestion is noted as well. IMPRESSION: 1. Improving aeration. 2. Mild pulmonary vascular congestion remains. 3. Left greater than right residual basilar airspace disease. This is concerning for left lower lobe pneumonia. Electronically Signed   By:  San Morelle M.D.   On: 11/07/2016 08:41   Dg Chest Portable 1 View  Result Date: 11/06/2016 CLINICAL DATA:  Shortness of breath. EXAM: PORTABLE CHEST 1  VIEW COMPARISON:  No prior. FINDINGS: Cardiomegaly with pulmonary venous congestion and diffuse bilateral pulmonary infiltrates consistent with pulmonary edema. No pleural effusion or pneumothorax. IMPRESSION: Cardiomegaly with pulmonary venous congestion and diffuse bilateral pulmonary infiltrates consistent with pulmonary edema . Electronically Signed   By: Marcello Moores  Register   On: 11/06/2016 06:40   US Abdomen Limited Ruq  Result Date: 11/06/2016 CLINICAL DATA:  Abnormal liver function tests. EXAM: ULTRASOUND ABDOMEN LIMITED RIGHT UPPER QUADRANT COMPARISON:  None. FINDINGS: Gallbladder: There are small calculi measuring a few mm each. There is a small volume intraluminal sludge. The patient was not tender to probe pressure over the gallbladder. There is mild mural thickening, 4.8 mm. No pericholecystic fluid. Common bile duct: Diameter: Upper normal, 7.2 mm. Liver: Diffuse echogenicity of hepatic parenchyma without focal lesion. Portal vein is patent on color Doppler imaging with normal direction of blood flow towards the liver. IMPRESSION: 1. Cholelithiasis. Slight gallbladder mural thickening. No tenderness over the gallbladder. 2. Increased echogenicity of hepatic parenchyma may represent fatty infiltration or hepatocellular disease. Electronically Signed   By: Andreas Newport M.D.   On: 11/06/2016 05:10    ASSESMENT:   *  Cholecystitis with SIRS/sepsis.  Day 2 Zosyn.  Though WBCs have risen, clinically she looks way better.   Ultrasound: cholelithiasis, sludge, GB wall thickening  CT: 10 mm CBD, distended GB.  ? Cholecystitis and/or cholangitis.  Hepatomegaly. 11 mm cystic mass in pancreatic tail (not seen on CT of 02/2016.    HIDA:  No excretion of tracer into the biliary tree up to 2 hours after injection. This can be seen with severe hepatocellular disease or complete common bile duct obstruction For perc chole drain in IR today.  Fatty liver vs hepatocellular disease per CT.    Had  unremarkable autoimmune, hepatitis serologies, ANA, Ferritin, A1AT but moderately strong + smooth muscle Ab on 8/22.   Followed at [facility] in [place].   T bili, alk phos, ALT rising.  Last fever at 11AM yesterday.    *  Flash pulmonary edema, Afib/RVR with acute resp failure.  Minor Troponin I elevation to 0.05.  Echo completed, no reading yet.    *  AKI.  Hypokalemia.    *  Incidental finding of right LL lung nodule per CT   PLAN   *  Per [place], Dr Vernard Gambles feels she needs ERCP for obstructed bile duct, not percutaneous drain.  Next step MRCP vs ERCP?       Tara Serrano  11/07/2016, 11:23 AM Pager: [phone]

## 2016-11-07 NOTE — Progress Notes (Signed)
  Echocardiogram 2D Echocardiogram with Definity has been performed.  Tresa Res 11/07/2016, 12:22 PM

## 2016-11-07 NOTE — Progress Notes (Signed)
PULMONARY / CRITICAL CARE MEDICINE   Name: Tara Serrano MRN: [id number] DOB: 1946/01/04    ADMISSION DATE:  11/06/2016 CONSULTATION DATE:  11/06/2016  REFERRING MD:  Dr. Wyvonnia Dusky   CHIEF COMPLAINT:  Acute Cholecystis   BRIEF: 71 y/o female with HTN, DM2, GERD admitted with RUQ pain, biliary obstruction.  Went to the ICU because she was briefly on BIPAP.   SUBJECTIVE:  Off BIPAP Feels better No abdominal pain  VITAL SIGNS: BP (!) 95/49   Pulse 75   Temp 97.9 F (36.6 C) (Oral)   Resp (!) 33   Ht 5' (1.524 m)   Wt 86 kg (189 lb 9.5 oz)   SpO2 (!) 89%   BMI 37.03 kg/m   HEMODYNAMICS:    VENTILATOR SETTINGS:    INTAKE / OUTPUT: I/O last 3 completed shifts: In: 561.2 [I.V.:11.2; IV Piggyback:550] Out: 1425 [ZRAQT:6226]  PHYSICAL EXAMINATION:  General:  Resting comfortably in bed HENT: NCAT OP clear PULM: CTA B, normal effort CV: RRR, no mgr GI: BS+, soft, nontender MSK: normal bulk and tone Neuro: awake, alert, no distress, MAEW   LABS:  BMET  Recent Labs Lab 11/06/16 0223 11/07/16 0237  NA 136 140  K 2.6* 3.1*  CL 95* 98*  CO2 27 29  BUN 25* 27*  CREATININE 1.33* 1.49*  GLUCOSE 251* 172*    Electrolytes  Recent Labs Lab 11/06/16 0223 11/06/16 0331 11/07/16 0237  CALCIUM 8.8*  --  8.1*  MG  --  1.6* 2.0  PHOS  --   --  5.7*    CBC  Recent Labs Lab 11/06/16 0223 11/07/16 0237  WBC 10.0 15.6*  HGB 12.2 10.7*  HCT 37.8 33.2*  PLT 256 208    Coag's  Recent Labs Lab 11/07/16 0237  APTT 36  INR 1.23    Sepsis Markers  Recent Labs Lab 11/06/16 0702 11/06/16 1035 11/06/16 1309 11/07/16 0237  LATICACIDVEN 4.09* 2.7* 2.5*  --   PROCALCITON  --  3.59  --  6.98    ABG  Recent Labs Lab 11/06/16 0743 11/06/16 2206  PHART 7.461* 7.465*  PCO2ART 37.9 44.2  PO2ART 70.0* 90.0    Liver Enzymes  Recent Labs Lab 11/06/16 0223 11/07/16 0237  AST 246* 186*  ALT 163* 189*  ALKPHOS 399* 415*  BILITOT 2.5* 5.6*   ALBUMIN 3.2* 2.5*    Cardiac Enzymes  Recent Labs Lab 11/06/16 1035 11/06/16 1309 11/06/16 1953  TROPONINI 0.03* 0.05* 0.03*    Glucose  Recent Labs Lab 11/06/16 1655 11/06/16 1959 11/06/16 2327 11/07/16 0320 11/07/16 0816 11/07/16 1205  GLUCAP 155* 189* 170* 159* 130* 99    Imaging Nm Hepatobiliary Liver Func  Addendum Date: 11/07/2016   ADDENDUM REPORT: 11/07/2016 10:47 ADDENDUM: Additional planar images were obtained 1 day post radiopharmaceutical administration. There is no evidence of radiotracer accumulation in the extrahepatic biliary tree or small bowel. Findings favor high-grade CBD obstruction, as was suggested on prior CT which demonstrated mild biliary dilatation. Correlate with laboratory studies. Consider ERCP for further evaluation and potential decompression. Electronically Signed   By: Lucrezia Europe M.D.   On: 11/07/2016 10:47   Result Date: 11/07/2016 CLINICAL DATA:  Cholecystitis cholangitis. Non-surgical candidate. Pre stent placement evaluation. EXAM: NUCLEAR MEDICINE HEPATOBILIARY IMAGING TECHNIQUE: Sequential images of the abdomen were obtained out to 60 minutes following intravenous administration of radiopharmaceutical. RADIOPHARMACEUTICALS:  5.1 mCi Tc-61m  Choletec IV COMPARISON:  Right upper quadrant abdomen ultrasound and abdomen pelvis CT obtained earlier today. FINDINGS: There  is prompt uptake of tracer by the liver with no excreted tracer seen in the biliary tree up to 2 hours after injection. IMPRESSION: No excretion of tracer into the biliary tree up to 2 hours after injection. This can be seen with severe hepatocellular disease or complete common bile duct obstruction. Delayed images will be obtained in morning on 11/07/2016 to help differentiate between these possibilities. Electronically Signed: By: Claudie Revering M.D. On: 11/06/2016 18:52   Dg Chest Port 1 View  Result Date: 11/07/2016 CLINICAL DATA:  Acute respiratory distress. EXAM: PORTABLE CHEST  1 VIEW COMPARISON:  One-view chest x-ray 11/06/2016. FINDINGS: The heart size is exaggerated by low lung volumes. Aeration of both lungs is improving. Persistent lower lobe airspace disease is noted, left greater than right. Mild pulmonary vascular congestion is noted as well. IMPRESSION: 1. Improving aeration. 2. Mild pulmonary vascular congestion remains. 3. Left greater than right residual basilar airspace disease. This is concerning for left lower lobe pneumonia. Electronically Signed   By: San Morelle M.D.   On: 11/07/2016 08:41     STUDIES:  [place] RUQ 8/31 > Cholelithiasis. Slight gallbladder mural thickening. No tenderness over the gallbladder. Increased echogenicity of hepatic parenchyma may represent fatty infiltration or hepatocellular disease. CT A/P 8/31 > Distended gallbladder with mild biliary dilatation. Findings seen with cholecystitis or, possibly cholangitis. Hepatomegaly. Mild cardiomegaly and suspected pulmonary edema. Recommend chest radiograph. An incidental finding of potential clinical significance has been found. 10 mm RIGHT lower lobe pulmonary nodule. Consider CT, PET-CT or tissue sampling at 3 months. This recommendation follows the consensus statement: Guidelines for Management of Small Pulmonary Nodules Detected on CT Scans: A Statement from the [place] as published in Radiology 2005; 237:395-400. An incidental finding of potential clinical significance has been found. 11 mm cystic mass tail of pancreas CXR 8/31 > Cardiomegaly with pulmonary venous congestion and diffuse bilateral pulmonary infiltrates consistent with pulmonary edema  CULTURES: Blood 8/31 >> Urine 8/31 >>  ANTIBIOTICS: Zosyn 8/31 >>  SIGNIFICANT EVENTS: 8/31 > Presents to ED   LINES/TUBES: PIV   DISCUSSION: 71 year old presents with one week of abdominal pain with n/v. Found to have acute cholecystis. While in ED became hypoxic requiring BiPAP. As of 9/1 her respiratory status  has improved but bilirubin is rising. She has not yet had definitive management of her biliary obstruction.   ASSESSMENT / PLAN:  PULMONARY A: Acute Hypoxic Respiratory Failure secondary to pulmonary edema  10 mm Right lower lobe nodule  P:   Monitor O2 saturation D/c BIPAP Will need outpatient f/u for pulmonary nodule   CARDIOVASCULAR A:  New Onset A.Fib H/O HTN P:  Tele Hold anticoagulation until plan for procedures known  RENAL A:   AKI> Cr a little worse today probably from diuresis K 3.1 P:   Replace K Monitor BMET and UOP Replace electrolytes as needed Gentle IV fluids  GASTROINTESTINAL A:   GERD  Biliary obstruction> Bili rising 9/1 cholecystitis 11 mm cystic mass tail of pancreas P:   NPO MRCP per GI May need ERCP  HEMATOLOGIC A:   No issues  P:  Monitor for bleeding  Continue sub q hep  INFECTIOUS A:   Septic Shock secondary to acute cholecystitis/cholangitis > resolved P:   Continue zosyn F/u blood cultures  ENDOCRINE A:   DM   P:   Monitor glucose SSI  NEUROLOGIC A:   Acute Metabolic Encephalopathy  > resolved Acute Pain  P:   PRN fentanyl  FAMILY  - Updates: Family updated at bedside. States patient has living will. Would not want CPR/ACLS medications however okay with short term intubation.   - Inter-disciplinary family meet or Palliative Care meeting due by:  11/13/2016  Move to floor, TRH service  Roselie Awkward, MD [facility] Pager: [phone] Cell: [phone] After 3pm or if no response, call [phone]

## 2016-11-07 NOTE — Progress Notes (Signed)
Received from 2 W at 15:47 accompanied by RN. Urine output only 40 cc since am. Will start 250cc NS at 75cc/hr per order.

## 2016-11-07 NOTE — Progress Notes (Signed)
Subjective/Chief Complaint: On BiPAP, reports RUQ pain   Objective: Vital signs in last 24 hours: Temp:  [98.3 F (36.8 C)-100.4 F (38 C)] 98.4 F (36.9 C) (09/01 0321) Pulse Rate:  [63-109] 78 (09/01 0600) Resp:  [16-42] 19 (09/01 0600) BP: (86-164)/(56-84) 86/63 (09/01 0600) SpO2:  [85 %-99 %] 98 % (09/01 0600) Weight:  [86 kg (189 lb 9.5 oz)] 86 kg (189 lb 9.5 oz) (09/01 0327)    Intake/Output from previous day: 08/31 0701 - 09/01 0700 In: 411.2 [I.V.:11.2; IV Piggyback:400] Out: 9563 [Urine:1425] Intake/Output this shift: No intake/output data recorded.  General appearance: cooperative Resp: few rales Cardio: S1, S2 normal GI: soft, tender RUQ  Lab Results:   Recent Labs  11/06/16 0223 11/07/16 0237  WBC 10.0 15.6*  HGB 12.2 10.7*  HCT 37.8 33.2*  PLT 256 208   BMET  Recent Labs  11/06/16 0223 11/07/16 0237  NA 136 140  K 2.6* 3.1*  CL 95* 98*  CO2 27 29  GLUCOSE 251* 172*  BUN 25* 27*  CREATININE 1.33* 1.49*  CALCIUM 8.8* 8.1*   PT/INR  Recent Labs  11/07/16 0237  LABPROT 15.4*  INR 1.23   ABG  Recent Labs  11/06/16 0743 11/06/16 2206  PHART 7.461* 7.465*  HCO3 27.0 31.8*    Studies/Results: Nm Hepatobiliary Liver Func  Result Date: 11/06/2016 CLINICAL DATA:  Cholecystitis cholangitis. Non-surgical candidate. Pre stent placement evaluation. EXAM: NUCLEAR MEDICINE HEPATOBILIARY IMAGING TECHNIQUE: Sequential images of the abdomen were obtained out to 60 minutes following intravenous administration of radiopharmaceutical. RADIOPHARMACEUTICALS:  5.1 mCi Tc-43m  Choletec IV COMPARISON:  Right upper quadrant abdomen ultrasound and abdomen pelvis CT obtained earlier today. FINDINGS: There is prompt uptake of tracer by the liver with no excreted tracer seen in the biliary tree up to 2 hours after injection. IMPRESSION: No excretion of tracer into the biliary tree up to 2 hours after injection. This can be seen with severe hepatocellular  disease or complete common bile duct obstruction. Delayed images will be obtained in morning on 11/07/2016 to help differentiate between these possibilities. Electronically Signed   By: Claudie Revering M.D.   On: 11/06/2016 18:52   Ct Abdomen Pelvis W Contrast  Result Date: 11/06/2016 CLINICAL DATA:  Epigastric pain for 1 week. Fever. Suspect infection. History of hypertension and diabetes. EXAM: CT ABDOMEN AND PELVIS WITH CONTRAST TECHNIQUE: Multidetector CT imaging of the abdomen and pelvis was performed using the standard protocol following bolus administration of intravenous contrast. CONTRAST:  149mL ISOVUE-300 IOPAMIDOL (ISOVUE-300) INJECTION 61% COMPARISON:  Abdominal ultrasound November 06, 2016 at 0458 hours FINDINGS: LOWER CHEST: 10 mm RIGHT lower lobe pulmonary nodule (axial 2/66, series 5). Hazy ground-glass opacities with interlobular septal thickening and atelectasis. The heart is mildly enlarged. Mild coronary artery calcifications. No pericardial effusion. Retained versus refluxed contrast in distal esophagus. HEPATOBILIARY: Distended gallbladder. Minimal intrahepatic biliary dilatation. The liver is 24 cm in cranial caudad dimension . Common bile duct is 10 mm without choledocholithiasis. PANCREAS: 11 mm cystic mass in tail of the pancreas, pancreas is otherwise unremarkable. SPLEEN: Normal. ADRENALS/URINARY TRACT: Kidneys are orthotopic, demonstrating symmetric enhancement. 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 Multifocal scarring in too small to characterize hypodensities bilateral kidneys. No nephrolithiasis or hydronephrosis. 12 mm cyst upper pole LEFT kidney. The unopacified ureters are normal in course and caliber. Delayed imaging through the kidneys demonstrates symmetric prompt contrast excretion within the proximal urinary collecting system. Urinary bladder  is partially distended and unremarkable. Normal adrenal glands.  STOMACH/BOWEL: The stomach, small and large bowel are normal in course and caliber without inflammatory changes, sensitivity decreased without oral contrast. Moderate descending and sigmoid colonic diverticulosis. The appendix is not discretely identified, however there are no inflammatory changes in the right lower quadrant. VASCULAR/LYMPHATIC: Aortoiliac vessels are normal in course and caliber. Mild calcific atherosclerosis. No lymphadenopathy by CT size criteria. REPRODUCTIVE: Normal. OTHER: No intraperitoneal free fluid or free air. MUSCULOSKELETAL: Expanded LEFT anterior rib (image 1/ 66), possible old fracture. Osteopenia. Multilevel moderate to severe degenerative change of the spine. IMPRESSION: 1. Distended gallbladder with mild biliary dilatation. Findings seen with cholecystitis or, possibly cholangitis. 2. Hepatomegaly. 3. Mild cardiomegaly and suspected pulmonary edema. Recommend chest radiograph. 4. **An incidental finding of potential clinical significance has been found. 10 mm RIGHT lower lobe pulmonary nodule. Consider CT, PET-CT or tissue sampling at 3 months. This recommendation follows the consensus statement: Guidelines for Management of Small Pulmonary Nodules Detected on CT Scans: A Statement from the [place] as published in Radiology 2005; 237:395-400. ** 5. **An incidental finding of potential clinical significance has been found. 11 mm cystic mass tail of pancreas. Recommend re- imaging at 2 years. This recommendation follows ACR consensus guidelines: Management of Incidental Pancreatic Cysts: A White Paper of the ACR Incidental Findings Committee. [place] 4128;78:676-720.** Aortic Atherosclerosis (ICD10-I70.0). Electronically Signed   By: Elon Alas M.D.   On: 11/06/2016 06:13   Dg Chest Portable 1 View  Result Date: 11/06/2016 CLINICAL DATA:  Shortness of breath. EXAM: PORTABLE CHEST 1 VIEW COMPARISON:  No prior. FINDINGS: Cardiomegaly with pulmonary venous  congestion and diffuse bilateral pulmonary infiltrates consistent with pulmonary edema. No pleural effusion or pneumothorax. IMPRESSION: Cardiomegaly with pulmonary venous congestion and diffuse bilateral pulmonary infiltrates consistent with pulmonary edema . Electronically Signed   By: Marcello Moores  Register   On: 11/06/2016 06:40   US Abdomen Limited Ruq  Result Date: 11/06/2016 CLINICAL DATA:  Abnormal liver function tests. EXAM: ULTRASOUND ABDOMEN LIMITED RIGHT UPPER QUADRANT COMPARISON:  None. FINDINGS: Gallbladder: There are small calculi measuring a few mm each. There is a small volume intraluminal sludge. The patient was not tender to probe pressure over the gallbladder. There is mild mural thickening, 4.8 mm. No pericholecystic fluid. Common bile duct: Diameter: Upper normal, 7.2 mm. Liver: Diffuse echogenicity of hepatic parenchyma without focal lesion. Portal vein is patent on color Doppler imaging with normal direction of blood flow towards the liver. IMPRESSION: 1. Cholelithiasis. Slight gallbladder mural thickening. No tenderness over the gallbladder. 2. Increased echogenicity of hepatic parenchyma may represent fatty infiltration or hepatocellular disease. Electronically Signed   By: Andreas Newport M.D.   On: 11/06/2016 05:10    Anti-infectives: Anti-infectives    Start     Dose/Rate Route Frequency Ordered Stop   11/06/16 0630  piperacillin-tazobactam (ZOSYN) IVPB 3.375 g     3.375 g 12.5 mL/hr over 240 Minutes Intravenous Every 8 hours 11/06/16 0620        Assessment/Plan: Cholecystitis with SIRS/sepsis - Zosyn, IR to place perc chole drain today  LOS: 1 day    Tara Serrano E 11/07/2016

## 2016-11-07 NOTE — Progress Notes (Signed)
[facility] Progress Note Patient Name: Gwyndolyn Guilford DOB: Apr 27, 1945 MRN: [id number]   Date of Service  11/07/2016  HPI/Events of Note    eICU Interventions  Hypokalemia -repleted      Intervention Category Intermediate Interventions: Electrolyte abnormality - evaluation and management  ALVA,RAKESH V. 11/07/2016, 4:24 AM

## 2016-11-08 ENCOUNTER — Encounter (HOSPITAL_COMMUNITY): Payer: Self-pay | Admitting: Certified Registered Nurse Anesthetist

## 2016-11-08 ENCOUNTER — Encounter (HOSPITAL_COMMUNITY)
Admission: EM | Disposition: A | Discharge status: Home / Self Care | Payer: Self-pay | Source: Home / Self Care | Attending: Internal Medicine

## 2016-11-08 DIAGNOSIS — I110 Hypertensive heart disease with heart failure: Secondary | ICD-10-CM

## 2016-11-08 DIAGNOSIS — R6520 Severe sepsis without septic shock: Secondary | ICD-10-CM

## 2016-11-08 DIAGNOSIS — I481 Persistent atrial fibrillation: Secondary | ICD-10-CM

## 2016-11-08 DIAGNOSIS — J810 Acute pulmonary edema: Secondary | ICD-10-CM

## 2016-11-08 DIAGNOSIS — A419 Sepsis, unspecified organism: Principal | ICD-10-CM

## 2016-11-08 DIAGNOSIS — I5021 Acute systolic (congestive) heart failure: Secondary | ICD-10-CM

## 2016-11-08 LAB — GLUCOSE, CAPILLARY
GLUCOSE-CAPILLARY: 56 mg/dL — AB (ref 65–99)
GLUCOSE-CAPILLARY: 121 mg/dL — AB (ref 65–99)
GLUCOSE-CAPILLARY: 86 mg/dL (ref 65–99)
GLUCOSE-CAPILLARY: 263 mg/dL — AB (ref 65–99)
GLUCOSE-CAPILLARY: 115 mg/dL — AB (ref 65–99)
Glucose-Capillary: 55 mg/dL — ABNORMAL LOW (ref 65–99)
Glucose-Capillary: 123 mg/dL — ABNORMAL HIGH (ref 65–99)

## 2016-11-08 LAB — COMPREHENSIVE METABOLIC PANEL
ALBUMIN: 2.2 g/dL — AB (ref 3.5–5.0)
ALT: 113 U/L — ABNORMAL HIGH (ref 14–54)
AST: 63 U/L — AB (ref 15–41)
Alkaline Phosphatase: 395 U/L — ABNORMAL HIGH (ref 38–126)
Anion gap: 12 (ref 5–15)
BILIRUBIN TOTAL: 3.4 mg/dL — AB (ref 0.3–1.2)
BUN: 30 mg/dL — AB (ref 6–20)
CO2: 30 mmol/L (ref 22–32)
Calcium: 8.2 mg/dL — ABNORMAL LOW (ref 8.9–10.3)
Chloride: 102 mmol/L (ref 101–111)
Creatinine, Ser: 1.3 mg/dL — ABNORMAL HIGH (ref 0.44–1.00)
GFR calc Af Amer: 47 mL/min — ABNORMAL LOW (ref >60)
GFR calc non Af Amer: 40 mL/min — ABNORMAL LOW (ref >60)
GLUCOSE: 71 mg/dL (ref 65–99)
POTASSIUM: 3.1 mmol/L — AB (ref 3.5–5.1)
SODIUM: 144 mmol/L (ref 135–145)
TOTAL PROTEIN: 6.2 g/dL — AB (ref 6.5–8.1)

## 2016-11-08 LAB — CBC
HCT: 35.1 % — ABNORMAL LOW (ref 36.0–46.0)
HEMOGLOBIN: 11.1 g/dL — AB (ref 12.0–15.0)
MCH: 28.2 pg (ref 26.0–34.0)
MCHC: 31.6 g/dL (ref 30.0–36.0)
MCV: 89.3 fL (ref 78.0–100.0)
Platelets: 216 10*3/uL (ref 150–400)
RBC: 3.93 MIL/uL (ref 3.87–5.11)
RDW: 14.3 % (ref 11.5–15.5)
WBC: 11.9 10*3/uL — AB (ref 4.0–10.5)

## 2016-11-08 LAB — PROCALCITONIN: Procalcitonin: 4.74 ng/mL

## 2016-11-08 SURGERY — CANCELLED PROCEDURE

## 2016-11-08 MED ORDER — POTASSIUM CHLORIDE 20 MEQ PO PACK
40.0000 meq | PACK | ORAL | Status: AC
  Administered 2016-11-08 (×2): 40 meq via ORAL
  Filled 2016-11-08 (×2): qty 2

## 2016-11-08 MED ORDER — CARVEDILOL 25 MG PO TABS
25.0000 mg | ORAL_TABLET | Freq: Two times a day (BID) | ORAL | Status: DC
  Administered 2016-11-08 – 2016-11-12 (×8): 25 mg via ORAL
  Filled 2016-11-08 (×8): qty 1

## 2016-11-08 MED ORDER — INSULIN ASPART 100 UNIT/ML ~~LOC~~ SOLN
0.0000 [IU] | Freq: Three times a day (TID) | SUBCUTANEOUS | Status: DC
  Administered 2016-11-08: 5 [IU] via SUBCUTANEOUS
  Administered 2016-11-10 – 2016-11-11 (×2): 2 [IU] via SUBCUTANEOUS
  Administered 2016-11-11: 1 [IU] via SUBCUTANEOUS
  Administered 2016-11-11: 2 [IU] via SUBCUTANEOUS

## 2016-11-08 MED ORDER — FUROSEMIDE 10 MG/ML IJ SOLN: 20.0000 mg | Freq: Once | INTRAMUSCULAR | Status: DC

## 2016-11-08 MED ORDER — PROPOFOL 10 MG/ML IV BOLUS
INTRAVENOUS | Status: AC
Start: 2016-11-08 — End: 2016-11-08
  Filled 2016-11-08: qty 20

## 2016-11-08 MED ORDER — DEXTROSE 50 % IV SOLN
25.0000 mL | Freq: Once | INTRAVENOUS | Status: AC
  Administered 2016-11-08: 25 mL via INTRAVENOUS

## 2016-11-08 MED ORDER — FENTANYL CITRATE (PF) 250 MCG/5ML IJ SOLN
INTRAMUSCULAR | Status: AC
  Filled 2016-11-08: qty 5

## 2016-11-08 MED ORDER — HEPARIN SODIUM (PORCINE) 5000 UNIT/ML IJ SOLN: 5000.0000 [IU] | Freq: Three times a day (TID) | INTRAMUSCULAR | Status: DC

## 2016-11-08 MED ORDER — MIDAZOLAM HCL 2 MG/2ML IJ SOLN
INTRAMUSCULAR | Status: AC
  Filled 2016-11-08: qty 2

## 2016-11-08 MED ORDER — FUROSEMIDE 10 MG/ML IJ SOLN
40.0000 mg | Freq: Once | INTRAMUSCULAR | Status: AC
  Administered 2016-11-08: 40 mg via INTRAVENOUS
  Filled 2016-11-08: qty 4

## 2016-11-08 MED ORDER — HEPARIN SODIUM (PORCINE) 5000 UNIT/ML IJ SOLN
5000.0000 [IU] | Freq: Three times a day (TID) | INTRAMUSCULAR | Status: DC
  Administered 2016-11-09 – 2016-11-10 (×3): 5000 [IU] via SUBCUTANEOUS
  Filled 2016-11-08 (×3): qty 1

## 2016-11-08 MED ORDER — DEXTROSE 50 % IV SOLN
INTRAVENOUS | Status: AC
  Filled 2016-11-08: qty 50

## 2016-11-08 NOTE — Progress Notes (Signed)
Hypoglycemic Event  CBG: 55  Treatment: clear juice  Symptoms: none  Follow-up CBG: Time:1335 CBG Result:123  Possible Reasons for Event: NPO  Comments/MD notified: notified MD Arrien; placed on clear liquid diet; will continue to monitor    [name]

## 2016-11-08 NOTE — Consult Note (Addendum)
Cardiology Consultation:   Patient ID: Tara Serrano; [id number]; [birth year]   Admit date: 11/06/2016 Date of Consult: 11/08/2016  Primary Care Provider: Patient, No Pcp Per Primary Cardiologist: none    Patient Profile:   Tara Serrano is a 71 y.o. female with a hx of DM and HTN who is being seen today for the evaluation of afib at the request of Dr .Cathlean Sauer.  History of Present Illness:   Tara Serrano reports being reasonably active.  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  She has occasional SOB with moderate activity.  She is now admitted with abdominal pain secondary to acute cholangitis. She has been found to have afib upon hospital admission as well as significant volume overload.  The patient does not have a history of afib.  She denies prior palpitations, dizziness, orthopnea, PND, CP, presyncope, or syncope. Echo has revealed EF 45-50%.  Cardiology is consulted for further evaluation and preoperative assessment.  Past Medical History:  Diagnosis Date  . Diabetes mellitus without complication ([place])   . GERD (gastroesophageal reflux disease)   . Hypertension     History reviewed. No pertinent surgical history.   Home Medications:  Prior to Admission medications   Medication Sig Start Date End Date Taking? Authorizing Provider  carvedilol (COREG) 25 MG tablet Take 25 mg by mouth 2 (two) times daily with a meal.   Yes [provider]  furosemide (LASIX) 20 MG tablet Take 20 mg by mouth 2 (two) times daily.   Yes [provider]  glimepiride (AMARYL) 4 MG tablet Take 8 mg by mouth daily before breakfast.   Yes [provider]  losartan (COZAAR) 100 MG tablet Take 100 mg by mouth daily.   Yes [provider]  NIFEdipine (ADALAT CC) 90 MG 24 hr tablet Take 90 mg by mouth daily.   Yes [provider]  RABEprazole (ACIPHEX) 20 MG tablet Take 20 mg by mouth 2 (two) times daily.    Yes [provider]     Inpatient Medications: Scheduled Meds: . carvedilol  25 mg Oral BID WC  . [START ON 11/09/2016] heparin subcutaneous  5,000 Units Subcutaneous Q8H  . insulin aspart  0-9 Units Subcutaneous TID WC  . pantoprazole (PROTONIX) IV  40 mg Intravenous Q24H  . potassium chloride  40 mEq Oral Q4H   Continuous Infusions: . piperacillin-tazobactam (ZOSYN)  IV 3.375 g (11/08/16 1321)   PRN Meds: fentaNYL (SUBLIMAZE) injection, hydrALAZINE  Allergies:    Allergies  Allergen Reactions  . Statins Other (See Comments)    Muscle problems    Social History:   Social History   Social History  . Marital status: Married    Spouse name: N/A  . Number of children: N/A  . Years of education: N/A   Occupational History  . Not on file.   Social History Main Topics  . Smoking status: Never Smoker  . Smokeless tobacco: Never Used  . Alcohol use No  . Drug use: No  . Sexual activity: Not on file   Other Topics Concern  . Not on file   Social History Narrative   Lives with spouse in [place].    Family History:   + afib  ROS:  Please see the history of present illness.  ROS  All other ROS reviewed and negative.     Physical Exam/Data:   Vitals:   11/07/16 2032 11/08/16 0445 11/08/16 0500 11/08/16 1303  BP: 125/64 123/64  127/64  Pulse:  84  89  Resp:      Temp: 98.8 F (37.1 C) 98.4 F (36.9 C)  97.8 F (36.6 C)  TempSrc: Oral Oral  Oral  SpO2: 92% 95%  95%  Weight:   192 lb 9 oz (87.3 kg)   Height:        Intake/Output Summary (Last 24 hours) at 11/08/16 1620 Last data filed at 11/08/16 1556  Gross per 24 hour  Intake              350 ml  Output             1265 ml  Net             -915 ml   Filed Weights   11/06/16 0207 11/07/16 0327 11/08/16 0500  Weight: 200 lb (90.7 kg) 189 lb 9.5 oz (86 kg) 192 lb 9 oz (87.3 kg)   Body mass index is 37.61 kg/m.  General:  Well nourished, well developed, in no acute distress HEENT: normal Lymph: no adenopathy Neck: no  JVD Endocrine:  No thryomegaly Vascular: No carotid bruits; FA pulses 2+ bilaterally without bruits  Cardiac:  iRRR Lungs:  clear to auscultation bilaterally, no wheezing, rhonchi or rales  Abd: soft, nontender, no hepatomegaly  Ext: no edema Musculoskeletal:  No deformities, BUE and BLE strength normal and equal Skin: warm and dry  Neuro:  CNs 2-12 intact, no focal abnormalities noted Psych:  Normal affect   EKG:  The EKG was personally reviewed and demonstrates:  RBBB with rate controlled afib Telemetry:  Telemetry was personally reviewed and demonstrates:  Rate controlled afib  Relevant CV Studies: Echo 11/07/16 reveals EF 45-50%, diffuse HK, moderate biatrial enlargement  Laboratory Data:  Chemistry  Recent Labs Lab 11/06/16 0223 11/07/16 0237 11/08/16 1046  NA 136 140 144  K 2.6* 3.1* 3.1*  CL 95* 98* 102  CO2 27 29 30   GLUCOSE 251* 172* 71  BUN 25* 27* 30*  CREATININE 1.33* 1.49* 1.30*  CALCIUM 8.8* 8.1* 8.2*  GFRNONAA 39* 34* 40*  GFRAA 45* 40* 47*  ANIONGAP 14 13 12      Recent Labs Lab 11/06/16 0223 11/07/16 0237 11/08/16 1046  PROT 7.3 6.5 6.2*  ALBUMIN 3.2* 2.5* 2.2*  AST 246* 186* 63*  ALT 163* 189* 113*  ALKPHOS 399* 415* 395*  BILITOT 2.5* 5.6* 3.4*   Hematology  Recent Labs Lab 11/06/16 0223 11/07/16 0237 11/08/16 1046  WBC 10.0 15.6* 11.9*  RBC 4.31 3.82* 3.93  HGB 12.2 10.7* 11.1*  HCT 37.8 33.2* 35.1*  MCV 87.7 86.9 89.3  MCH 28.3 28.0 28.2  MCHC 32.3 32.2 31.6  RDW 13.6 14.3 14.3  PLT 256 208 216   Cardiac Enzymes  Recent Labs Lab 11/06/16 1035 11/06/16 1309 11/06/16 1953  TROPONINI 0.03* 0.05* 0.03*     Recent Labs Lab 11/06/16 0236  TROPIPOC 0.01    BNP  Recent Labs Lab 11/06/16 0649  BNP 369.6*    DDimer No results for input(s): DDIMER in the last 168 hours.   Assessment and Plan:   1. afib The patient presents with new onset afib.  Her chads2vasc score is at least 5.  She will require initiation of  anticoagulation once able from a surgical standpoint.  She is currently rate controlled.  As the duration of her afib is unknown, I would advise rate control over rhythm control at this time.  Once she has recovered post operatively, we can reconsider rhythm control at that  time.  2. Acute systolic dysfunction Unclear etiology Likely due to afib, though CAD needs to be excluded.  Given prior SOB, I would advise lexiscan myoview prior to surgery.  If low risk, she could proceed with her surgery. Continue gentle diuresis at this time. She is currently on coreg.  Resume ARB if BP and renal function remain stable with diuresis.  3. 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 Preoperative assessment. Continue gentle diuresis Would proceed with lexiscan once clinically more stable (hopefully Tuesday) Would defer surgery if possible until after she is more stable and results of myoview are known.  Cardiology to follow  Signed, Thompson Grayer, MD  11/08/2016 4:20 PM

## 2016-11-08 NOTE — Progress Notes (Signed)
Hypoglycemic Event  CBG: 56  Treatment: D50 IV 25 mL  Symptoms: None  Follow-up CBG: Time:0534 CBG Result:121  Possible Reasons for Event: Inadequate meal intake  Comments/MD notified:Xenia Blount,NP    Camillia Herter, Leroy Libman C

## 2016-11-08 NOTE — Progress Notes (Signed)
PROGRESS NOTE    Tara Serrano  EUM:[id number] DOB: [birth year] DOA: 11/06/2016 PCP: Patient, No Pcp Per    Brief Narrative:  71 year old female who presented with abdominal pain. Patient is known to have type 2 diabetes mellitus, hypertension and GERD. Patient reported abdominal pain for about 7 days prior to hospitalization, associated with nausea and poor appetite. On the initial physical examination blood pressure 140/76, heart rate 99, temperature 100.7, respiratory rate 37, oxygen saturation 91% on BiPAP. She was noted to be in moderate respiratory distress, lethargic, moist mucous membranes, heart S1-S2 present, irregular and tachycardic, lungs with bibasilar rales and positive use of accessory muscles, abdomen was tender to deep palpation, active bowel sounds, lower extremities with no edema. Sodium 136, potassium 2.6, chloride 95, bicarbonate 27, glucose 251, BUN 25, creatinine 1.33, lipase 28, alkaline phosphatase 399, AST 246, AST 163, total bilirubin is 2.5. Lactic acid 2.1, white count 10.0, hemoglobin 12.2, hematocrit 37.8, platelets 256. Urinalysis with 6-30 white cells, specific gravity 1.032, pH 5.0. Abdominal CT with distended gallbladder with mild biliary dilatation findings consistent with cholecystitis and possible cholangitis, hepatomegaly, incidental pulmonary nodule and pancreatic cyst. Chest film with mild right rotation, with inspissated, mildly over-penetrated, positive interstitial infiltrates at bases bilaterally with cephalization of vasculature. EKG with atrial fibrillation 72 bpm with a right bundle branch block. Abdominal ultrasound with cholelithiasis.  Patient admitted to the hospital with the working diagnosis of sepsis due to cholangitis, present on admission, complicated by hypoxic respiratory failure.   Assessment & Plan:   Active Problems:   Acute cholecystitis   Atrial fibrillation (HCC)   Cholangitis   1. Sepsis due to cholangitis, present on admission,  complicated with acute systolic heart failure and cardiogenic pulmonary edema. Resolving sepsis, will continue antibiotic therapy with Zosyn IV #2. Cultures no growth, will need cholecystectomy for source control.   Noted echocardiogram with diffuse hypokinesis and mildly reduced systolic function, with ejection fraction 45 to 50% (technically difficult study). Dilated RV with decreased systolic function, dilated IVC. EKG with no ischemic features, suspected cardiac dysfunction due to severe sepsis. Will need repeat echocardiogram in 4 weeks. Will keep negative fluid balance, furosemide 40 mg IV x1. Oxygen saturation at 95% on 4 LPM nasal cannula flow. Clinically still hypervolemic, will need further diuresis before surgical procedure. Doubt that patient may need a ischemic workup before surgery considering severe sepsis at presentation and no regional wall motion abnormalities on echocardiography, but will consult cardiology for recommendations.   2. Hypoxic respiratory failure. Due to cardiogenic pulmonary edema, will continue to keep negative fluid balance. Follow up chest film personally reviewed with improvement of infiltrates. Will continue oxymetry and supplemental oxygen per nasal cannula, wean as tolerated.    3. New onset atrial fibrillation. Rate controlled, will continue telemetry monitoring, will resume coreg. Holding anticoagulation for now due to imminent surgical procedure. CHADsVASc score is up to 5, qualifies for full anticoagulation for primary stroke prophylaxis.   3. Type 2 diabetes mellitus. Will continue glucose cover and monitoring, capillary glucose 90, 81, 56, 121, 86. Will change to sensitive scale to prevent hypoglycemia.   4. Hypertension. Will continue to hold on losartan and nifedipine, will resume on coreg.    DVT prophylaxis: enoxaparin   Code Status: Partial (no cpr) Family Communication: no family at bedside  Disposition Plan: home    Consultants:    Gastroenterology  Surgery  Procedures:     Antimicrobials:   Zosyn.    Subjective: Patient feeling better, no nausea or  vomiting, positive appetite, improved abdominal pain. Denies any dyspnea, or chest pain, no orthopnea or pnd at home. No history of angina.   Objective: Vitals:   11/07/16 1831 11/07/16 2032 11/08/16 0445 11/08/16 0500  BP: (!) 129/55 125/64 123/64   Pulse: 84  84   Resp:      Temp: 97.8 F (36.6 C) 98.8 F (37.1 C) 98.4 F (36.9 C)   TempSrc: Oral Oral Oral   SpO2: 91% 92% 95%   Weight:    87.3 kg (192 lb 9 oz)  Height:        Intake/Output Summary (Last 24 hours) at 11/08/16 1125 Last data filed at 11/08/16 0512  Gross per 24 hour  Intake              350 ml  Output              440 ml  Net              -90 ml   Filed Weights   11/06/16 0207 11/07/16 0327 11/08/16 0500  Weight: 90.7 kg (200 lb) 86 kg (189 lb 9.5 oz) 87.3 kg (192 lb 9 oz)    Examination:  General: deconditioned  Neurology: Awake and alert, non focal  E ENT: no pallor, no icterus, oral mucosa moist Cardiovascular: No JVD, S1-S2 present, irregulary irregular, no gallops, rubs, or murmurs. No jugular venous distention, no trace lower extremity edema. Pulmonary: decreased breath sounds bilaterally, adequate air movement, no wheezing, or rhonchi, positive bibasilar rales. Gastrointestinal. Abdomen flat, no organomegaly, non tender, no rebound or guarding Skin. No rashes Musculoskeletal: no joint deformities     Data Reviewed: I have personally reviewed following labs and imaging studies  CBC:  Recent Labs Lab 11/06/16 0223 11/07/16 0237  WBC 10.0 15.6*  HGB 12.2 10.7*  HCT 37.8 33.2*  MCV 87.7 86.9  PLT 256 998   Basic Metabolic Panel:  Recent Labs Lab 11/06/16 0223 11/06/16 0331 11/07/16 0237  NA 136  --  140  K 2.6*  --  3.1*  CL 95*  --  98*  CO2 27  --  29  GLUCOSE 251*  --  172*  BUN 25*  --  27*  CREATININE 1.33*  --  1.49*  CALCIUM 8.8*  --   8.1*  MG  --  1.6* 2.0  PHOS  --   --  5.7*   GFR: Estimated Creatinine Clearance: 34 mL/min (A) (by C-G formula based on SCr of 1.49 mg/dL (H)). Liver Function Tests:  Recent Labs Lab 11/06/16 0223 11/07/16 0237  AST 246* 186*  ALT 163* 189*  ALKPHOS 399* 415*  BILITOT 2.5* 5.6*  PROT 7.3 6.5  ALBUMIN 3.2* 2.5*    Recent Labs Lab 11/06/16 0223  LIPASE 28   No results for input(s): AMMONIA in the last 168 hours. Coagulation Profile:  Recent Labs Lab 11/07/16 0237  INR 1.23   Cardiac Enzymes:  Recent Labs Lab 11/06/16 1035 11/06/16 1309 11/06/16 1953  TROPONINI 0.03* 0.05* 0.03*   BNP (last 3 results) No results for input(s): PROBNP in the last 8760 hours. HbA1C: No results for input(s): HGBA1C in the last 72 hours. CBG:  Recent Labs Lab 11/07/16 2018 11/07/16 2328 11/08/16 0442 11/08/16 0534 11/08/16 0806  GLUCAP 90 81 56* 121* 86   Lipid Profile: No results for input(s): CHOL, HDL, LDLCALC, TRIG, CHOLHDL, LDLDIRECT in the last 72 hours. Thyroid Function Tests: No results for input(s): TSH, T4TOTAL, FREET4, T3FREE, THYROIDAB  in the last 72 hours. Anemia Panel: No results for input(s): VITAMINB12, FOLATE, FERRITIN, TIBC, IRON, RETICCTPCT in the last 72 hours.    Radiology Studies: I have reviewed all of the imaging during this hospital visit personally     Scheduled Meds: . [START ON 11/09/2016] heparin subcutaneous  5,000 Units Subcutaneous Q8H  . insulin aspart  0-15 Units Subcutaneous Q4H  . pantoprazole (PROTONIX) IV  40 mg Intravenous Q24H   Continuous Infusions: . sodium chloride 250 mL (11/07/16 1633)  . sodium chloride    . piperacillin-tazobactam (ZOSYN)  IV Stopped (11/08/16 0909)     LOS: 2 days        Davide Risdon Gerome Apley, MD [facility] Pager [phone]

## 2016-11-08 NOTE — Progress Notes (Signed)
Progress Note   Subjective  Pt moved to medical floor yesterday Much less RUQ pain today Hungry, wants to eat MRCP performed   Objective  Vital signs in last 24 hours: Temp:  [97.8 F (36.6 C)-98.8 F (37.1 C)] 98.4 F (36.9 C) (09/02 0445) Pulse Rate:  [75-84] 84 (09/02 0445) Resp:  [27-33] 27 (09/01 1400) BP: (88-129)/(49-64) 123/64 (09/02 0445) SpO2:  [89 %-95 %] 95 % (09/02 0445) Weight:  [192 lb 9 oz (87.3 kg)] 192 lb 9 oz (87.3 kg) (09/02 0500) Last BM Date: 11/06/16 Gen: awake, alert, NAD HEENT: anicteric, op clear CV: RRR, no mrg Pulm: CTA b/l Abd: soft, mild right upper quad tenderness to deep palp, but significantly less today, ND, +BS throughout Ext: no c/c, trace pretib edema Neuro: nonfocal   Intake/Output from previous day: 09/01 0701 - 09/02 0700 In: 390 [I.V.:290; IV Piggyback:100] Out: 490 [Urine:490] Intake/Output this shift: No intake/output data recorded.  Lab Results:  Recent Labs  11/06/16 0223 11/07/16 0237  WBC 10.0 15.6*  HGB 12.2 10.7*  HCT 37.8 33.2*  PLT 256 208   BMET  Recent Labs  11/06/16 0223 11/07/16 0237  NA 136 140  K 2.6* 3.1*  CL 95* 98*  CO2 27 29  GLUCOSE 251* 172*  BUN 25* 27*  CREATININE 1.33* 1.49*  CALCIUM 8.8* 8.1*   LFT  Recent Labs  11/07/16 0237  PROT 6.5  ALBUMIN 2.5*  AST 186*  ALT 189*  ALKPHOS 415*  BILITOT 5.6*  BILIDIR 3.9*  IBILI 1.7*   PT/INR  Recent Labs  11/07/16 0237  LABPROT 15.4*  INR 1.23   Hepatitis Panel No results for input(s): HEPBSAG, HCVAB, HEPAIGM, HEPBIGM in the last 72 hours.  Studies/Results: Nm Hepatobiliary Liver Func  Addendum Date: 11/07/2016   ADDENDUM REPORT: 11/07/2016 10:47 ADDENDUM: Additional planar images were obtained 1 day post radiopharmaceutical administration. There is no evidence of radiotracer accumulation in the extrahepatic biliary tree or small bowel. Findings favor high-grade CBD obstruction, as was suggested on prior CT which  demonstrated mild biliary dilatation. Correlate with laboratory studies. Consider ERCP for further evaluation and potential decompression. Electronically Signed   By: Lucrezia Europe M.D.   On: 11/07/2016 10:47   Result Date: 11/07/2016 CLINICAL DATA:  Cholecystitis cholangitis. Non-surgical candidate. Pre stent placement evaluation. EXAM: NUCLEAR MEDICINE HEPATOBILIARY IMAGING TECHNIQUE: Sequential images of the abdomen were obtained out to 60 minutes following intravenous administration of radiopharmaceutical. RADIOPHARMACEUTICALS:  5.1 mCi Tc-96m  Choletec IV COMPARISON:  Right upper quadrant abdomen ultrasound and abdomen pelvis CT obtained earlier today. FINDINGS: There is prompt uptake of tracer by the liver with no excreted tracer seen in the biliary tree up to 2 hours after injection. IMPRESSION: No excretion of tracer into the biliary tree up to 2 hours after injection. This can be seen with severe hepatocellular disease or complete common bile duct obstruction. Delayed images will be obtained in morning on 11/07/2016 to help differentiate between these possibilities. Electronically Signed: By: Claudie Revering M.D. On: 11/06/2016 18:52   Mr Abdomen Mrcp Wo Contrast  Result Date: 11/07/2016 CLINICAL DATA:  Inpatient. Abnormal liver function tests. Cholelithiasis. Dilated common bile duct. No evidence of radiotracer excretion into the biliary tree or bowel at hepatobiliary scintigraphy study. EXAM: MRI ABDOMEN WITHOUT CONTRAST  (INCLUDING MRCP) TECHNIQUE: Multiplanar multisequence MR imaging of the abdomen was performed. Heavily T2-weighted images of the biliary and pancreatic ducts were obtained, and three-dimensional MRCP images were rendered by post processing. COMPARISON:  11/06/2016 hepatobiliary scintigraphy study, CT abdomen/ pelvis and right upper quadrant abdominal sonogram. FINDINGS: Lower chest: Re- demonstration of 10 mm solid right lower lobe pulmonary nodule (series 6/ image 1). Mild dependent  bibasilar atelectasis. Cardiomegaly. Hepatobiliary: Mild hepatomegaly. No definite liver surface irregularity. No hepatic steatosis. No liver mass. Distended gallbladder (4.4 cm diameter). Multiple subcentimeter gallstones are seen layering in the gallbladder and within the cystic duct. Mild diffuse gallbladder wall thickening. No significant pericholecystic fluid. Mild diffuse periportal edema, not appreciably changed since the CT study from 1 day prior. No intrahepatic biliary ductal dilatation . Common bile duct diameter 5 mm, decreased from 10 mm diameter on 11/06/2016 CT study. No convincing filling defects in the common bile duct to suggest choledocholithiasis. No biliary stricture. Probable tiny periampullary duodenal diverticulum. Pancreas: At least 4 scattered unilocular cystic lesions in the distal pancreatic body and tail, largest 1.1 x 1.1 cm in the pancreatic tail (series 6/ image 24), none of which demonstrate wall thickening or internal complexity. No pancreatic duct dilation. No pancreas divisum. No peripancreatic fluid collections. Spleen: Normal size. No mass. Adrenals/Urinary Tract: Left adrenal 2.0 cm nodule demonstrates significant signal loss on chemical shift imaging, compatible with a benign left adrenal adenoma. No right adrenal nodules. No hydronephrosis. 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 There are numerous subcentimeter T2 hyperintense renal cortical lesions scattered throughout both kidneys, several of which demonstrate T1 hyperintensity, incompletely characterized on this scan. Solitary 0.7 cm T2 hypointense T1 hyperintense renal cortical lesion in the posterior lower left kidney (series 6/ image 42). Stomach/Bowel: Grossly normal stomach. Visualized small and large bowel is normal caliber, with no bowel wall thickening. Vascular/Lymphatic: Nonaneurysmal abdominal aorta. Porta hepatis adenopathy measuring up to 1.7 cm (series 8/  image 25). Mildly enlarged 1.0 cm gastrohepatic ligament node (series 8/ image 21). Other: Trace perihepatic ascites.  No focal fluid collections. Musculoskeletal: Expansile 3.7 x 3.1 cm osseous lesion in the anterior left fifth rib (series 7/image 7). IMPRESSION: 1. Cholelithiasis. Distended gallbladder. Mild diffuse gallbladder wall thickening. Findings suggest acute calculous cholecystitis in the correct clinical setting. 2. No intrahepatic biliary ductal dilatation. Common bile duct diameter 5 mm, decreased from 10 mm diameter on the CT study from 1 day prior. No evidence of choledocholithiasis. 3. Stable mild diffuse periportal edema, nonspecific, which could be due to IV hydration and/or ascending cholangitis. 4. Expansile 3.7 cm anterior left fifth rib bone lesion, indeterminate. Differential includes metastasis or plasmacytoma. Consider outpatient evaluation with PET-CT and/or bone biopsy, as clinically warranted. 5. Re- demonstration of 10 mm right lower lobe pulmonary nodule. Consider one of the following in 3 months for both low-risk and high-risk individuals: (a) repeat chest CT, (b) follow-up PET-CT, or (c) tissue sampling. This recommendation follows the consensus statement: Guidelines for Management of Incidental Pulmonary Nodules Detected on CT Images: From the Fleischner Society 2017; Radiology 2017; 284:228-243. 6. Nonspecific mild upper retroperitoneal lymphadenopathy. Recommend attention on follow-up CT abdomen/pelvis with oral and IV contrast in 3 months. This recommendation follows ACR consensus guidelines: White Paper of the ACR Incidental Findings Committee II on Splenic and Nodal Findings. J Am Coll Radiol 2013;10:789-794. 7. Left adrenal adenoma. 8. Unilocular cystic pancreatic tail and distal pancreatic body lesions, largest 1.1 cm, without overt high risk MRI features on this noncontrast study. Follow-up MRI abdomen without and with IV contrast recommended in 2 years. This recommendation  follows ACR consensus guidelines: Management of Incidental Pancreatic Cysts: A White Paper  of the ACR Incidental Findings Committee. J Am Coll Radiol 4627;03:500-938. 9. Subcentimeter T1 hyperintense renal cortical lesions in both kidneys, incompletely characterized on this noncontrast study. Follow-up MRI abdomen without and with IV contrast recommended in 6-12 months. This recommendation follows ACR consensus guidelines: Management of the Incidental Renal Mass on CT: A White Paper of the ACR Incidental Findings Committee. J Am Coll Radiol [phone]. Electronically Signed   By: Ilona Sorrel M.D.   On: 11/07/2016 15:48   Mr 3d Recon At Scanner  Result Date: 11/07/2016 CLINICAL DATA:  Inpatient. Abnormal liver function tests. Cholelithiasis. Dilated common bile duct. No evidence of radiotracer excretion into the biliary tree or bowel at hepatobiliary scintigraphy study. EXAM: MRI ABDOMEN WITHOUT CONTRAST  (INCLUDING MRCP) TECHNIQUE: Multiplanar multisequence MR imaging of the abdomen was performed. Heavily T2-weighted images of the biliary and pancreatic ducts were obtained, and three-dimensional MRCP images were rendered by post processing. COMPARISON:  11/06/2016 hepatobiliary scintigraphy study, CT abdomen/ pelvis and right upper quadrant abdominal sonogram. FINDINGS: Lower chest: Re- demonstration of 10 mm solid right lower lobe pulmonary nodule (series 6/ image 1). Mild dependent bibasilar atelectasis. Cardiomegaly. Hepatobiliary: Mild hepatomegaly. No definite liver surface irregularity. No hepatic steatosis. No liver mass. Distended gallbladder (4.4 cm diameter). Multiple subcentimeter gallstones are seen layering in the gallbladder and within the cystic duct. Mild diffuse gallbladder wall thickening. No significant pericholecystic fluid. Mild diffuse periportal edema, not appreciably changed since the CT study from 1 day prior. No intrahepatic biliary ductal dilatation . Common bile duct diameter 5  mm, decreased from 10 mm diameter on 11/06/2016 CT study. No convincing filling defects in the common bile duct to suggest choledocholithiasis. No biliary stricture. Probable tiny periampullary duodenal diverticulum. Pancreas: At least 4 scattered unilocular cystic lesions in the distal pancreatic body and tail, largest 1.1 x 1.1 cm in the pancreatic tail (series 6/ image 24), none of which demonstrate wall thickening or internal complexity. No pancreatic duct dilation. No pancreas divisum. No peripancreatic fluid collections. Spleen: Normal size. No mass. Adrenals/Urinary Tract: Left adrenal 2.0 cm nodule demonstrates significant signal loss on chemical shift imaging, compatible with a benign left adrenal adenoma. No right adrenal nodules. No hydronephrosis. 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 There are numerous subcentimeter T2 hyperintense renal cortical lesions scattered throughout both kidneys, several of which demonstrate T1 hyperintensity, incompletely characterized on this scan. Solitary 0.7 cm T2 hypointense T1 hyperintense renal cortical lesion in the posterior lower left kidney (series 6/ image 42). Stomach/Bowel: Grossly normal stomach. Visualized small and large bowel is normal caliber, with no bowel wall thickening. Vascular/Lymphatic: Nonaneurysmal abdominal aorta. Porta hepatis adenopathy measuring up to 1.7 cm (series 8/ image 25). Mildly enlarged 1.0 cm gastrohepatic ligament node (series 8/ image 21). Other: Trace perihepatic ascites.  No focal fluid collections. Musculoskeletal: Expansile 3.7 x 3.1 cm osseous lesion in the anterior left fifth rib (series 7/image 7). IMPRESSION: 1. Cholelithiasis. Distended gallbladder. Mild diffuse gallbladder wall thickening. Findings suggest acute calculous cholecystitis in the correct clinical setting. 2. No intrahepatic biliary ductal dilatation. Common bile duct diameter 5 mm, decreased from 10 mm  diameter on the CT study from 1 day prior. No evidence of choledocholithiasis. 3. Stable mild diffuse periportal edema, nonspecific, which could be due to IV hydration and/or ascending cholangitis. 4. Expansile 3.7 cm anterior left fifth rib bone lesion, indeterminate. Differential includes metastasis or plasmacytoma. Consider outpatient evaluation with PET-CT and/or bone biopsy, as clinically warranted.  5. Re- demonstration of 10 mm right lower lobe pulmonary nodule. Consider one of the following in 3 months for both low-risk and high-risk individuals: (a) repeat chest CT, (b) follow-up PET-CT, or (c) tissue sampling. This recommendation follows the consensus statement: Guidelines for Management of Incidental Pulmonary Nodules Detected on CT Images: From the Fleischner Society 2017; Radiology 2017; 284:228-243. 6. Nonspecific mild upper retroperitoneal lymphadenopathy. Recommend attention on follow-up CT abdomen/pelvis with oral and IV contrast in 3 months. This recommendation follows ACR consensus guidelines: White Paper of the ACR Incidental Findings Committee II on Splenic and Nodal Findings. J Am Coll Radiol 2013;10:789-794. 7. Left adrenal adenoma. 8. Unilocular cystic pancreatic tail and distal pancreatic body lesions, largest 1.1 cm, without overt high risk MRI features on this noncontrast study. Follow-up MRI abdomen without and with IV contrast recommended in 2 years. This recommendation follows ACR consensus guidelines: Management of Incidental Pancreatic Cysts: A White Paper of the ACR Incidental Findings Committee. J Am Coll Radiol 9528;41:324-401. 9. Subcentimeter T1 hyperintense renal cortical lesions in both kidneys, incompletely characterized on this noncontrast study. Follow-up MRI abdomen without and with IV contrast recommended in 6-12 months. This recommendation follows ACR consensus guidelines: Management of the Incidental Renal Mass on CT: A White Paper of the ACR Incidental Findings  Committee. J Am Coll Radiol [phone]. Electronically Signed   By: Ilona Sorrel M.D.   On: 11/07/2016 15:48   Dg Chest Port 1 View  Result Date: 11/07/2016 CLINICAL DATA:  Acute respiratory distress. EXAM: PORTABLE CHEST 1 VIEW COMPARISON:  One-view chest x-ray 11/06/2016. FINDINGS: The heart size is exaggerated by low lung volumes. Aeration of both lungs is improving. Persistent lower lobe airspace disease is noted, left greater than right. Mild pulmonary vascular congestion is noted as well. IMPRESSION: 1. Improving aeration. 2. Mild pulmonary vascular congestion remains. 3. Left greater than right residual basilar airspace disease. This is concerning for left lower lobe pneumonia. Electronically Signed   By: San Morelle M.D.   On: 11/07/2016 08:41      Assessment & Plan  71 yo female with HTN, DM2, GERD, here with RUQ pain, sepsis picture and cholecystitis versus biliary obstruction  1. Cholecystitis -- Clinically she is much improved.  Discussion yesterday regarding CBD obstruction.  MRCP yesterday reviewed.  No evidence of CBD stone, and CBD size is now 5 mm.  MRI suggestive of cholecystitis as previously suggested by [place].  Labs not checked today -- I have ordered CBC and CMP On abx Next step is lap chole versus perc tube No role for endoscopic intervention at this time.  2. Panc tail lesion -- cystic by MRI.  This can/should be followed up after discharge  3. Lung lesion/rib lesion -- seen again by MRI.  This will need attention and repeat, dedicated imaging, once acute issues improve/resolve, see #1  GI will sign off, but remain available    LOS: 2 days   Serayah Yazdani M  11/08/2016, 10:22 AM

## 2016-11-08 NOTE — Progress Notes (Signed)
IR following for assessment of acute cholecystitis with possible intervention.   Patient has been evaluated by surgery today.  They are considering cholecystectomy.   Notified Dr. Annamaria Boots of current plan. No need for procedure in IR at this time.   Brynda Greathouse, MS RD PA-C 11:57 AM

## 2016-11-08 NOTE — Progress Notes (Signed)
[facility] Progress Note  Day of Surgery  Subjective: CC: wants to eat Patient feeling much better. Denies abdominal pain, nausea, vomiting. Wanting to know if she still needs a drain vs. Surgically removing gallbladder.   Objective: Vital signs in last 24 hours: Temp:  [97.8 F (36.6 C)-98.8 F (37.1 C)] 98.4 F (36.9 C) (09/02 0445) Pulse Rate:  [74-84] 84 (09/02 0445) Resp:  [27-33] 27 (09/01 1400) BP: (88-129)/(45-64) 123/64 (09/02 0445) SpO2:  [89 %-95 %] 95 % (09/02 0445) Weight:  [87.3 kg (192 lb 9 oz)] 87.3 kg (192 lb 9 oz) (09/02 0500) Last BM Date: 11/06/16  Intake/Output from previous day: 09/01 0701 - 09/02 0700 In: 390 [I.V.:290; IV Piggyback:100] Out: 490 [Urine:490] Intake/Output this shift: No intake/output data recorded.  PE: Gen:  Alert, NAD, pleasant Card:  Regular rate and rhythm, pedal pulses 2+ BL Pulm:  Normal effort, clear to auscultation bilaterally Abd: Soft, non-tender, non-distended, bowel sounds present, no HSM Skin: warm and dry, no rashes  Psych: A&Ox3   Lab Results:   Recent Labs  11/06/16 0223 11/07/16 0237  WBC 10.0 15.6*  HGB 12.2 10.7*  HCT 37.8 33.2*  PLT 256 208   BMET  Recent Labs  11/06/16 0223 11/07/16 0237  NA 136 140  K 2.6* 3.1*  CL 95* 98*  CO2 27 29  GLUCOSE 251* 172*  BUN 25* 27*  CREATININE 1.33* 1.49*  CALCIUM 8.8* 8.1*   PT/INR  Recent Labs  11/07/16 0237  LABPROT 15.4*  INR 1.23   CMP     Component Value Date/Time   NA 140 11/07/2016 0237   K 3.1 (L) 11/07/2016 0237   CL 98 (L) 11/07/2016 0237   CO2 29 11/07/2016 0237   GLUCOSE 172 (H) 11/07/2016 0237   BUN 27 (H) 11/07/2016 0237   CREATININE 1.49 (H) 11/07/2016 0237   CALCIUM 8.1 (L) 11/07/2016 0237   PROT 6.5 11/07/2016 0237   ALBUMIN 2.5 (L) 11/07/2016 0237   AST 186 (H) 11/07/2016 0237   ALT 189 (H) 11/07/2016 0237   ALKPHOS 415 (H) 11/07/2016 0237   BILITOT 5.6 (H) 11/07/2016 0237   GFRNONAA 34 (L) 11/07/2016  0237   GFRAA 40 (L) 11/07/2016 0237   Lipase     Component Value Date/Time   LIPASE 28 11/06/2016 0223    Studies/Results: Nm Hepatobiliary Liver Func  Addendum Date: 11/07/2016   ADDENDUM REPORT: 11/07/2016 10:47 ADDENDUM: Additional planar images were obtained 1 day post radiopharmaceutical administration. There is no evidence of radiotracer accumulation in the extrahepatic biliary tree or small bowel. Findings favor high-grade CBD obstruction, as was suggested on prior CT which demonstrated mild biliary dilatation. Correlate with laboratory studies. Consider ERCP for further evaluation and potential decompression. Electronically Signed   By: Lucrezia Europe M.D.   On: 11/07/2016 10:47   Result Date: 11/07/2016 CLINICAL DATA:  Cholecystitis cholangitis. Non-surgical candidate. Pre stent placement evaluation. EXAM: NUCLEAR MEDICINE HEPATOBILIARY IMAGING TECHNIQUE: Sequential images of the abdomen were obtained out to 60 minutes following intravenous administration of radiopharmaceutical. RADIOPHARMACEUTICALS:  5.1 mCi Tc-51m Choletec IV COMPARISON:  Right upper quadrant abdomen ultrasound and abdomen pelvis CT obtained earlier today. FINDINGS: There is prompt uptake of tracer by the liver with no excreted tracer seen in the biliary tree up to 2 hours after injection. IMPRESSION: No excretion of tracer into the biliary tree up to 2 hours after injection. This can be seen with severe hepatocellular disease or complete common bile duct obstruction. Delayed  images will be obtained in morning on 11/07/2016 to help differentiate between these possibilities. Electronically Signed: By: Claudie Revering M.D. On: 11/06/2016 18:52   Mr Abdomen Mrcp Wo Contrast  Result Date: 11/07/2016 CLINICAL DATA:  Inpatient. Abnormal liver function tests. Cholelithiasis. Dilated common bile duct. No evidence of radiotracer excretion into the biliary tree or bowel at hepatobiliary scintigraphy study. EXAM: MRI ABDOMEN WITHOUT  CONTRAST  (INCLUDING MRCP) TECHNIQUE: Multiplanar multisequence MR imaging of the abdomen was performed. Heavily T2-weighted images of the biliary and pancreatic ducts were obtained, and three-dimensional MRCP images were rendered by post processing. COMPARISON:  11/06/2016 hepatobiliary scintigraphy study, CT abdomen/ pelvis and right upper quadrant abdominal sonogram. FINDINGS: Lower chest: Re- demonstration of 10 mm solid right lower lobe pulmonary nodule (series 6/ image 1). Mild dependent bibasilar atelectasis. Cardiomegaly. Hepatobiliary: Mild hepatomegaly. No definite liver surface irregularity. No hepatic steatosis. No liver mass. Distended gallbladder (4.4 cm diameter). Multiple subcentimeter gallstones are seen layering in the gallbladder and within the cystic duct. Mild diffuse gallbladder wall thickening. No significant pericholecystic fluid. Mild diffuse periportal edema, not appreciably changed since the CT study from 1 day prior. No intrahepatic biliary ductal dilatation . Common bile duct diameter 5 mm, decreased from 10 mm diameter on 11/06/2016 CT study. No convincing filling defects in the common bile duct to suggest choledocholithiasis. No biliary stricture. Probable tiny periampullary duodenal diverticulum. Pancreas: At least 4 scattered unilocular cystic lesions in the distal pancreatic body and tail, largest 1.1 x 1.1 cm in the pancreatic tail (series 6/ image 24), none of which demonstrate wall thickening or internal complexity. No pancreatic duct dilation. No pancreas divisum. No peripancreatic fluid collections. Spleen: Normal size. No mass. Adrenals/Urinary Tract: Left adrenal 2.0 cm nodule demonstrates significant signal loss on chemical shift imaging, compatible with a benign left adrenal adenoma. No right adrenal nodules. No hydronephrosis. 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 There are numerous subcentimeter T2 hyperintense renal  cortical lesions scattered throughout both kidneys, several of which demonstrate T1 hyperintensity, incompletely characterized on this scan. Solitary 0.7 cm T2 hypointense T1 hyperintense renal cortical lesion in the posterior lower left kidney (series 6/ image 42). Stomach/Bowel: Grossly normal stomach. Visualized small and large bowel is normal caliber, with no bowel wall thickening. Vascular/Lymphatic: Nonaneurysmal abdominal aorta. Porta hepatis adenopathy measuring up to 1.7 cm (series 8/ image 25). Mildly enlarged 1.0 cm gastrohepatic ligament node (series 8/ image 21). Other: Trace perihepatic ascites.  No focal fluid collections. Musculoskeletal: Expansile 3.7 x 3.1 cm osseous lesion in the anterior left fifth rib (series 7/image 7). IMPRESSION: 1. Cholelithiasis. Distended gallbladder. Mild diffuse gallbladder wall thickening. Findings suggest acute calculous cholecystitis in the correct clinical setting. 2. No intrahepatic biliary ductal dilatation. Common bile duct diameter 5 mm, decreased from 10 mm diameter on the CT study from 1 day prior. No evidence of choledocholithiasis. 3. Stable mild diffuse periportal edema, nonspecific, which could be due to IV hydration and/or ascending cholangitis. 4. Expansile 3.7 cm anterior left fifth rib bone lesion, indeterminate. Differential includes metastasis or plasmacytoma. Consider outpatient evaluation with PET-CT and/or bone biopsy, as clinically warranted. 5. Re- demonstration of 10 mm right lower lobe pulmonary nodule. Consider one of the following in 3 months for both low-risk and high-risk individuals: (a) repeat chest CT, (b) follow-up PET-CT, or (c) tissue sampling. This recommendation follows the consensus statement: Guidelines for Management of Incidental Pulmonary Nodules Detected on CT Images: From the [place]  2017; Radiology 2017; 622:297-989. 6. Nonspecific mild upper retroperitoneal lymphadenopathy. Recommend attention on follow-up CT  abdomen/pelvis with oral and IV contrast in 3 months. This recommendation follows ACR consensus guidelines: White Paper of the ACR Incidental Findings Committee II on Splenic and Nodal Findings. J Am Coll Radiol 2013;10:789-794. 7. Left adrenal adenoma. 8. Unilocular cystic pancreatic tail and distal pancreatic body lesions, largest 1.1 cm, without overt high risk MRI features on this noncontrast study. Follow-up MRI abdomen without and with IV contrast recommended in 2 years. This recommendation follows ACR consensus guidelines: Management of Incidental Pancreatic Cysts: A White Paper of the ACR Incidental Findings Committee. J Am Coll Radiol 2119;41:740-814. 9. Subcentimeter T1 hyperintense renal cortical lesions in both kidneys, incompletely characterized on this noncontrast study. Follow-up MRI abdomen without and with IV contrast recommended in 6-12 months. This recommendation follows ACR consensus guidelines: Management of the Incidental Renal Mass on CT: A White Paper of the ACR Incidental Findings Committee. J Am Coll Radiol [phone]. Electronically Signed   By: Ilona Sorrel M.D.   On: 11/07/2016 15:48   Mr 3d Recon At Scanner  Result Date: 11/07/2016 CLINICAL DATA:  Inpatient. Abnormal liver function tests. Cholelithiasis. Dilated common bile duct. No evidence of radiotracer excretion into the biliary tree or bowel at hepatobiliary scintigraphy study. EXAM: MRI ABDOMEN WITHOUT CONTRAST  (INCLUDING MRCP) TECHNIQUE: Multiplanar multisequence MR imaging of the abdomen was performed. Heavily T2-weighted images of the biliary and pancreatic ducts were obtained, and three-dimensional MRCP images were rendered by post processing. COMPARISON:  11/06/2016 hepatobiliary scintigraphy study, CT abdomen/ pelvis and right upper quadrant abdominal sonogram. FINDINGS: Lower chest: Re- demonstration of 10 mm solid right lower lobe pulmonary nodule (series 6/ image 1). Mild dependent bibasilar atelectasis.  Cardiomegaly. Hepatobiliary: Mild hepatomegaly. No definite liver surface irregularity. No hepatic steatosis. No liver mass. Distended gallbladder (4.4 cm diameter). Multiple subcentimeter gallstones are seen layering in the gallbladder and within the cystic duct. Mild diffuse gallbladder wall thickening. No significant pericholecystic fluid. Mild diffuse periportal edema, not appreciably changed since the CT study from 1 day prior. No intrahepatic biliary ductal dilatation . Common bile duct diameter 5 mm, decreased from 10 mm diameter on 11/06/2016 CT study. No convincing filling defects in the common bile duct to suggest choledocholithiasis. No biliary stricture. Probable tiny periampullary duodenal diverticulum. Pancreas: At least 4 scattered unilocular cystic lesions in the distal pancreatic body and tail, largest 1.1 x 1.1 cm in the pancreatic tail (series 6/ image 24), none of which demonstrate wall thickening or internal complexity. No pancreatic duct dilation. No pancreas divisum. No peripancreatic fluid collections. Spleen: Normal size. No mass. Adrenals/Urinary Tract: Left adrenal 2.0 cm nodule demonstrates significant signal loss on chemical shift imaging, compatible with a benign left adrenal adenoma. No right adrenal nodules. No hydronephrosis. 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 There are numerous subcentimeter T2 hyperintense renal cortical lesions scattered throughout both kidneys, several of which demonstrate T1 hyperintensity, incompletely characterized on this scan. Solitary 0.7 cm T2 hypointense T1 hyperintense renal cortical lesion in the posterior lower left kidney (series 6/ image 42). Stomach/Bowel: Grossly normal stomach. Visualized small and large bowel is normal caliber, with no bowel wall thickening. Vascular/Lymphatic: Nonaneurysmal abdominal aorta. Porta hepatis adenopathy measuring up to 1.7 cm (series 8/ image 25). Mildly  enlarged 1.0 cm gastrohepatic ligament node (series 8/ image 21). Other: Trace perihepatic ascites.  No focal fluid collections. Musculoskeletal: Expansile 3.7 x 3.1 cm osseous  lesion in the anterior left fifth rib (series 7/image 7). IMPRESSION: 1. Cholelithiasis. Distended gallbladder. Mild diffuse gallbladder wall thickening. Findings suggest acute calculous cholecystitis in the correct clinical setting. 2. No intrahepatic biliary ductal dilatation. Common bile duct diameter 5 mm, decreased from 10 mm diameter on the CT study from 1 day prior. No evidence of choledocholithiasis. 3. Stable mild diffuse periportal edema, nonspecific, which could be due to IV hydration and/or ascending cholangitis. 4. Expansile 3.7 cm anterior left fifth rib bone lesion, indeterminate. Differential includes metastasis or plasmacytoma. Consider outpatient evaluation with PET-CT and/or bone biopsy, as clinically warranted. 5. Re- demonstration of 10 mm right lower lobe pulmonary nodule. Consider one of the following in 3 months for both low-risk and high-risk individuals: (a) repeat chest CT, (b) follow-up PET-CT, or (c) tissue sampling. This recommendation follows the consensus statement: Guidelines for Management of Incidental Pulmonary Nodules Detected on CT Images: From the [employer] 2017; Radiology 2017; 284:228-243. 6. Nonspecific mild upper retroperitoneal lymphadenopathy. Recommend attention on follow-up CT abdomen/pelvis with oral and IV contrast in 3 months. This recommendation follows ACR consensus guidelines: White Paper of the ACR Incidental Findings Committee II on Splenic and Nodal Findings. J Am Coll Radiol 2013;10:789-794. 7. Left adrenal adenoma. 8. Unilocular cystic pancreatic tail and distal pancreatic body lesions, largest 1.1 cm, without overt high risk MRI features on this noncontrast study. Follow-up MRI abdomen without and with IV contrast recommended in 2 years. This recommendation follows ACR  consensus guidelines: Management of Incidental Pancreatic Cysts: A White Paper of the ACR Incidental Findings Committee. J Am Coll Radiol 9629;52:841-324. 9. Subcentimeter T1 hyperintense renal cortical lesions in both kidneys, incompletely characterized on this noncontrast study. Follow-up MRI abdomen without and with IV contrast recommended in 6-12 months. This recommendation follows ACR consensus guidelines: Management of the Incidental Renal Mass on CT: A White Paper of the ACR Incidental Findings Committee. J Am Coll Radiol [phone]. Electronically Signed   By: Ilona Sorrel M.D.   On: 11/07/2016 15:48   Dg Chest Port 1 View  Result Date: 11/07/2016 CLINICAL DATA:  Acute respiratory distress. EXAM: PORTABLE CHEST 1 VIEW COMPARISON:  One-view chest x-ray 11/06/2016. FINDINGS: The heart size is exaggerated by low lung volumes. Aeration of both lungs is improving. Persistent lower lobe airspace disease is noted, left greater than right. Mild pulmonary vascular congestion is noted as well. IMPRESSION: 1. Improving aeration. 2. Mild pulmonary vascular congestion remains. 3. Left greater than right residual basilar airspace disease. This is concerning for left lower lobe pneumonia. Electronically Signed   By: San Morelle M.D.   On: 11/07/2016 08:41    Anti-infectives: Anti-infectives    Start     Dose/Rate Route Frequency Ordered Stop   11/06/16 0630  piperacillin-tazobactam (ZOSYN) IVPB 3.375 g     3.375 g 12.5 mL/hr over 240 Minutes Intravenous Every 8 hours 11/06/16 0620         Assessment/Plan Cholecystitis with SIRS/sepsis - sepsis physiology resolved - Tbili 1.7 yesterday, AST/ALT in 180s, Alk phos 415 - MRCP done yesterday - negative for choledocholithiasis, findings suggestive of acute calculous cholecystitis - WBC 15.6  Yesterday, afebrile - continue IV Zosyn - IR drain not placed yesterday - could potentially have lap chole - will discuss with MD, keep NPO and hold  anticoagulation for now   AFib with RVR with acute resp failure - off BIPAP - new onset AFib, likely related to infection  AKI - Cr 1.49 yesterday RLL lung nodule on CT -  incidental finding, will need OP f/u  FEN - NPO, IVF VTE - SCDs, hold heparin ID - IV Zosyn (8/31>>), blood cxs with no growth  Plan: Will discuss with MD role for percutaneous drain vs lap chole.  LOS: 2 days    Brigid Re , [facility] 11/08/2016, 9:53 AM Pager: [phone] Consults: [phone] Mon-Fri 7:00 am-4:30 pm Sat-Sun 7:00 am-11:30 am

## 2016-11-09 ENCOUNTER — Inpatient Hospital Stay (HOSPITAL_COMMUNITY): Payer: Medicare Other

## 2016-11-09 DIAGNOSIS — D649 Anemia, unspecified: Secondary | ICD-10-CM

## 2016-11-09 DIAGNOSIS — I5043 Acute on chronic combined systolic (congestive) and diastolic (congestive) heart failure: Secondary | ICD-10-CM

## 2016-11-09 DIAGNOSIS — R079 Chest pain, unspecified: Secondary | ICD-10-CM

## 2016-11-09 DIAGNOSIS — E876 Hypokalemia: Secondary | ICD-10-CM

## 2016-11-09 LAB — BASIC METABOLIC PANEL
Anion gap: 3 — ABNORMAL LOW (ref 5–15)
BUN: 23 mg/dL — AB (ref 6–20)
CHLORIDE: 102 mmol/L (ref 101–111)
CO2: 37 mmol/L — ABNORMAL HIGH (ref 22–32)
CREATININE: 1.13 mg/dL — AB (ref 0.44–1.00)
Calcium: 8.4 mg/dL — ABNORMAL LOW (ref 8.9–10.3)
GFR, EST AFRICAN AMERICAN: 55 mL/min — AB (ref >60)
GFR, EST NON AFRICAN AMERICAN: 48 mL/min — AB (ref >60)
Glucose, Bld: 66 mg/dL (ref 65–99)
POTASSIUM: 3.4 mmol/L — AB (ref 3.5–5.1)
SODIUM: 142 mmol/L (ref 135–145)

## 2016-11-09 LAB — CBC WITH DIFFERENTIAL/PLATELET
Basophils Absolute: 0 10*3/uL (ref 0.0–0.1)
Basophils Relative: 0 %
EOS ABS: 0.1 10*3/uL (ref 0.0–0.7)
EOS PCT: 1 %
HCT: 33.9 % — ABNORMAL LOW (ref 36.0–46.0)
HEMOGLOBIN: 10.6 g/dL — AB (ref 12.0–15.0)
Lymphocytes Relative: 12 %
Lymphs Abs: 1.2 10*3/uL (ref 0.7–4.0)
MCH: 27.8 pg (ref 26.0–34.0)
MCHC: 31.3 g/dL (ref 30.0–36.0)
MCV: 89 fL (ref 78.0–100.0)
Monocytes Absolute: 0.7 10*3/uL (ref 0.1–1.0)
Monocytes Relative: 7 %
NEUTROS PCT: 80 %
Neutro Abs: 7.8 10*3/uL — ABNORMAL HIGH (ref 1.7–7.7)
Platelets: 231 10*3/uL (ref 150–400)
RBC: 3.81 MIL/uL — AB (ref 3.87–5.11)
RDW: 14.6 % (ref 11.5–15.5)
WBC: 9.8 10*3/uL (ref 4.0–10.5)

## 2016-11-09 LAB — NM MYOCAR MULTI W/SPECT W/WALL MOTION / EF
CHL CUP RESTING HR STRESS: 80 {beats}/min
Exercise duration (min): 4 min

## 2016-11-09 LAB — GLUCOSE, CAPILLARY
GLUCOSE-CAPILLARY: 112 mg/dL — AB (ref 65–99)
Glucose-Capillary: 94 mg/dL (ref 65–99)
Glucose-Capillary: 95 mg/dL (ref 65–99)

## 2016-11-09 LAB — FERRITIN: Ferritin: 161 ng/mL (ref 11–307)

## 2016-11-09 MED ORDER — POTASSIUM CHLORIDE 20 MEQ/15ML (10%) PO SOLN
40.0000 meq | ORAL | Status: AC
  Administered 2016-11-09 (×2): 40 meq via ORAL
  Filled 2016-11-09 (×3): qty 30

## 2016-11-09 MED ORDER — REGADENOSON 0.4 MG/5ML IV SOLN
INTRAVENOUS | Status: AC
  Administered 2016-11-09: 0.4 mg via INTRAVENOUS
  Filled 2016-11-09: qty 5

## 2016-11-09 MED ORDER — TECHNETIUM TC 99M TETROFOSMIN IV KIT
30.0000 | PACK | Freq: Once | INTRAVENOUS | Status: AC | PRN
  Administered 2016-11-09: 30 via INTRAVENOUS

## 2016-11-09 MED ORDER — TECHNETIUM TC 99M TETROFOSMIN IV KIT
10.0000 | PACK | Freq: Once | INTRAVENOUS | Status: AC | PRN
  Administered 2016-11-09: 10 via INTRAVENOUS

## 2016-11-09 MED ORDER — POTASSIUM CHLORIDE 20 MEQ PO PACK
40.0000 meq | PACK | Freq: Once | ORAL | Status: AC
  Administered 2016-11-09: 40 meq via ORAL
  Filled 2016-11-09: qty 2

## 2016-11-09 MED ORDER — REGADENOSON 0.4 MG/5ML IV SOLN
0.4000 mg | Freq: Once | INTRAVENOUS | Status: AC
  Administered 2016-11-09: 0.4 mg via INTRAVENOUS

## 2016-11-09 MED ORDER — FUROSEMIDE 20 MG PO TABS
20.0000 mg | ORAL_TABLET | Freq: Every day | ORAL | Status: DC
  Administered 2016-11-09 – 2016-11-12 (×3): 20 mg via ORAL
  Filled 2016-11-09 (×3): qty 1

## 2016-11-09 NOTE — Progress Notes (Signed)
PROGRESS NOTE    Tara Serrano  SWN:[id number] DOB: September 24, 1945 DOA: 11/06/2016 PCP: Patient, No Pcp Per    Brief Narrative:  71 year old female who presented with abdominal pain. Patient is known to have type 2 diabetes mellitus, hypertension and GERD. Patient reported abdominal pain for about 7 days prior to hospitalization, associated with nausea and poor appetite. On the initial physical examination blood pressure 140/76, heart rate 99, temperature 100.7, respiratory rate 37, oxygen saturation 91% on BiPAP. She was noted to be in moderate respiratory distress, lethargic, moist mucous membranes, heart S1-S2 present, irregular and tachycardic, lungs with bibasilar rales and positive use of accessory muscles, abdomen was tender to deep palpation, active bowel sounds, lower extremities with no edema. Sodium 136, potassium 2.6, chloride 95, bicarbonate 27, glucose 251, BUN 25, creatinine 1.33, lipase 28, alkaline phosphatase 399, AST 246, AST 163, total bilirubin is 2.5. Lactic acid 2.1, white count 10.0, hemoglobin 12.2, hematocrit 37.8, platelets 256. Urinalysis with 6-30 white cells, specific gravity 1.032, pH 5.0. Abdominal CT with distended gallbladder with mild biliary dilatation findings consistent with cholecystitis and possible cholangitis, hepatomegaly, incidental pulmonary nodule and pancreatic cyst. Chest film with mild right rotation, with inspissated, mildly over-penetrated, positive interstitial infiltrates at bases bilaterally with cephalization of vasculature. EKG with atrial fibrillation 72 bpm with a right bundle branch block. Abdominal ultrasound with cholelithiasis.  Patient admitted to the hospital with the working diagnosis of sepsis due to cholangitis, present on admission, complicated by hypoxic respiratory failure due to cardiogenic pulmonary edema and new onset atrial fibrillation.  Patient regained hemodynamic stability, pending stress test, before surgical intervention,  cholecystectomy.     Assessment & Plan:   Active Problems:   Acute cholecystitis   Atrial fibrillation (HCC)   Cholangitis   1. Sepsis due to cholangitis, present on admission, complicated with acute systolic heart failure and cardiogenic pulmonary edema. Continue antibiotic therapy with Zosyn IV #3. Cultures no growth, will need cholecystectomy for source control. Will continue to target negative fluid balance, clinically more euvolemic, will resume home dose of PO furosemide and will continue carvedilol. Follow on functional testing, nuclear stress test before proceeding with surgical intervention.   2. Hypoxic respiratory failure. Follow up chest film personally reviewed with improvement of infiltrates, small bilateral pleural effusions, decreased oxygen requirements to 2 LPM, will continue oxymetry monitoring and supplemental 02 per Joseph to target 02 saturation more than 92%.   3. New onset atrial fibrillation. Continue rate controlled, HR in the 80's. Telemetry monitoring with persistent atrial fibrillation,  CHADsVASc score is up to 5, will start anticoagulation after surgical intervention.   3. Type 2 diabetes mellitus.  Capillary glucose 123, 263, 115, 112, 94. Continue insulin sliding scale for glucose cover and monitoring.  4. Hypertension. Holding losartan and nifedipine, to prevent hypotension, continue diuresis and b blockade with carvedilol.   DVT prophylaxis: enoxaparin   Code Status: Partial (no cpr) Family Communication: no family at bedside  Disposition Plan: home    Consultants:   Gastroenterology  Surgery  Cardiology    Procedures:     Antimicrobials:   Zosyn.      Subjective: Patient with generalize malaise, no nausea or vomiting, moderate dyspepsia, able to tolerate clears, no improving or worsening factors, no associated dyspnea or chest pain. No orthopnea or pnd.   Objective: Vitals:   11/08/16 1837 11/08/16 1900 11/08/16 2203  11/09/16 0515  BP: 133/80 122/64 (!) 119/59 136/70  Pulse: 84 81 80 78  Resp:    17  Temp:  98.3 F (36.8 C) 98.5 F (36.9 C) 98.1 F (36.7 C)  TempSrc:  Oral Oral Oral  SpO2:  94% 91% 93%  Weight:    88.8 kg (195 lb 11.2 oz)  Height:        Intake/Output Summary (Last 24 hours) at 11/09/16 0905 Last data filed at 11/09/16 0516  Gross per 24 hour  Intake              440 ml  Output             1725 ml  Net            -1285 ml   Filed Weights   11/07/16 0327 11/08/16 0500 11/09/16 0515  Weight: 86 kg (189 lb 9.5 oz) 87.3 kg (192 lb 9 oz) 88.8 kg (195 lb 11.2 oz)    Examination:  General: deconditioned Neurology: Awake and alert, non focal  E ENT: no pallor, no icterus, oral mucosa moist Cardiovascular: No JVD, S1-S2 present, rhythmic, no gallops, rubs, or murmurs. No jugular venous distention, trace extremity edema. Pulmonary: vesicular breath sounds bilaterally, adequate air movement, no wheezing, rhonchi, scattered rales. Gastrointestinal. Abdomen flat, no organomegaly, non tender, no rebound or guarding Skin. No rashes Musculoskeletal: no joint deformities     Data Reviewed: I have personally reviewed following labs and imaging studies  CBC:  Recent Labs Lab 11/06/16 0223 11/07/16 0237 11/08/16 1046 11/09/16 0359  WBC 10.0 15.6* 11.9* 9.8  NEUTROABS  --   --   --  7.8*  HGB 12.2 10.7* 11.1* 10.6*  HCT 37.8 33.2* 35.1* 33.9*  MCV 87.7 86.9 89.3 89.0  PLT 256 208 216 540   Basic Metabolic Panel:  Recent Labs Lab 11/06/16 0223 11/06/16 0331 11/07/16 0237 11/08/16 1046 11/09/16 0359  NA 136  --  140 144 142  K 2.6*  --  3.1* 3.1* 3.4*  CL 95*  --  98* 102 102  CO2 27  --  29 30 37*  GLUCOSE 251*  --  172* 71 66  BUN 25*  --  27* 30* 23*  CREATININE 1.33*  --  1.49* 1.30* 1.13*  CALCIUM 8.8*  --  8.1* 8.2* 8.4*  MG  --  1.6* 2.0  --   --   PHOS  --   --  5.7*  --   --    GFR: Estimated Creatinine Clearance: 45.3 mL/min (A) (by C-G formula  based on SCr of 1.13 mg/dL (H)). Liver Function Tests:  Recent Labs Lab 11/06/16 0223 11/07/16 0237 11/08/16 1046  AST 246* 186* 63*  ALT 163* 189* 113*  ALKPHOS 399* 415* 395*  BILITOT 2.5* 5.6* 3.4*  PROT 7.3 6.5 6.2*  ALBUMIN 3.2* 2.5* 2.2*    Recent Labs Lab 11/06/16 0223  LIPASE 28   No results for input(s): AMMONIA in the last 168 hours. Coagulation Profile:  Recent Labs Lab 11/07/16 0237  INR 1.23   Cardiac Enzymes:  Recent Labs Lab 11/06/16 1035 11/06/16 1309 11/06/16 1953  TROPONINI 0.03* 0.05* 0.03*   BNP (last 3 results) No results for input(s): PROBNP in the last 8760 hours. HbA1C: No results for input(s): HGBA1C in the last 72 hours. CBG:  Recent Labs Lab 11/08/16 1246 11/08/16 1335 11/08/16 1616 11/08/16 2201 11/09/16 0729  GLUCAP 55* 123* 263* 115* 112*   Lipid Profile: No results for input(s): CHOL, HDL, LDLCALC, TRIG, CHOLHDL, LDLDIRECT in the last 72 hours. Thyroid Function Tests: No results for input(s): TSH, T4TOTAL,  FREET4, T3FREE, THYROIDAB in the last 72 hours. Anemia Panel: No results for input(s): VITAMINB12, FOLATE, FERRITIN, TIBC, IRON, RETICCTPCT in the last 72 hours.    Radiology Studies: I have reviewed all of the imaging during this hospital visit personally     Scheduled Meds: . carvedilol  25 mg Oral BID WC  . heparin subcutaneous  5,000 Units Subcutaneous Q8H  . insulin aspart  0-9 Units Subcutaneous TID WC  . pantoprazole (PROTONIX) IV  40 mg Intravenous Q24H  . potassium chloride  40 mEq Oral Q4H   Continuous Infusions: . piperacillin-tazobactam (ZOSYN)  IV 3.375 g (11/09/16 0511)     LOS: 3 days      Mattias Walmsley Gerome Apley, MD [facility] Pager [phone]

## 2016-11-09 NOTE — Progress Notes (Signed)
1 Day Post-Op   Subjective/Chief Complaint: Patient is off the floor for cardiac stress test. No significant changes reported by nursing   Objective: Vital signs in last 24 hours: Temp:  [97.8 F (36.6 C)-98.5 F (36.9 C)] 98.1 F (36.7 C) (09/03 0515) Pulse Rate:  [78-89] 78 (09/03 0515) Resp:  [17] 17 (09/03 0515) BP: (93-136)/(59-80) 112/64 (09/03 1013) SpO2:  [91 %-95 %] 93 % (09/03 0515) Weight:  [88.8 kg (195 lb 11.2 oz)] 88.8 kg (195 lb 11.2 oz) (09/03 0515) Last BM Date: 11/08/16  Intake/Output from previous day: 09/02 0701 - 09/03 0700 In: 440 [P.O.:440] Out: 1725 [Urine:1725] Intake/Output this shift: No intake/output data recorded.  Lab Results:   Recent Labs  11/08/16 1046 11/09/16 0359  WBC 11.9* 9.8  HGB 11.1* 10.6*  HCT 35.1* 33.9*  PLT 216 231   BMET  Recent Labs  11/08/16 1046 11/09/16 0359  NA 144 142  K 3.1* 3.4*  CL 102 102  CO2 30 37*  GLUCOSE 71 66  BUN 30* 23*  CREATININE 1.30* 1.13*  CALCIUM 8.2* 8.4*   Hepatic Function Latest Ref Rng & Units 11/08/2016 11/07/2016 11/06/2016  Total Protein 6.5 - 8.1 g/dL 6.2(L) 6.5 7.3  Albumin 3.5 - 5.0 g/dL 2.2(L) 2.5(L) 3.2(L)  AST 15 - 41 U/L 63(H) 186(H) 246(H)  ALT 14 - 54 U/L 113(H) 189(H) 163(H)  Alk Phosphatase 38 - 126 U/L 395(H) 415(H) 399(H)  Total Bilirubin 0.3 - 1.2 mg/dL 3.4(H) 5.6(H) 2.5(H)  Bilirubin, Direct 0.1 - 0.5 mg/dL - 3.9(H) -    PT/INR  Recent Labs  11/07/16 0237  LABPROT 15.4*  INR 1.23   ABG  Recent Labs  11/06/16 2206  PHART 7.465*  HCO3 31.8*    Studies/Results: Dg Chest 2 View  Result Date: 11/09/2016 CLINICAL DATA:  Pleuritic pain EXAM: CHEST  2 VIEW COMPARISON:  Chest CT 11/06/2016 FINDINGS: Stable enlarged cardiac silhouette. No effusion, infiltrate pneumothorax. Small effusions and low lung volumes. Remote LEFT rib fracture. IMPRESSION: Basilar atelectasis and small effusions. Electronically Signed   By: Suzy Bouchard M.D.   On: 11/09/2016 08:18    Mr Abdomen Mrcp Wo Contrast  Result Date: 11/07/2016 CLINICAL DATA:  Inpatient. Abnormal liver function tests. Cholelithiasis. Dilated common bile duct. No evidence of radiotracer excretion into the biliary tree or bowel at hepatobiliary scintigraphy study. EXAM: MRI ABDOMEN WITHOUT CONTRAST  (INCLUDING MRCP) TECHNIQUE: Multiplanar multisequence MR imaging of the abdomen was performed. Heavily T2-weighted images of the biliary and pancreatic ducts were obtained, and three-dimensional MRCP images were rendered by post processing. COMPARISON:  11/06/2016 hepatobiliary scintigraphy study, CT abdomen/ pelvis and right upper quadrant abdominal sonogram. FINDINGS: Lower chest: Re- demonstration of 10 mm solid right lower lobe pulmonary nodule (series 6/ image 1). Mild dependent bibasilar atelectasis. Cardiomegaly. Hepatobiliary: Mild hepatomegaly. No definite liver surface irregularity. No hepatic steatosis. No liver mass. Distended gallbladder (4.4 cm diameter). Multiple subcentimeter gallstones are seen layering in the gallbladder and within the cystic duct. Mild diffuse gallbladder wall thickening. No significant pericholecystic fluid. Mild diffuse periportal edema, not appreciably changed since the CT study from 1 day prior. No intrahepatic biliary ductal dilatation . Common bile duct diameter 5 mm, decreased from 10 mm diameter on 11/06/2016 CT study. No convincing filling defects in the common bile duct to suggest choledocholithiasis. No biliary stricture. Probable tiny periampullary duodenal diverticulum. Pancreas: At least 4 scattered unilocular cystic lesions in the distal pancreatic body and tail, largest 1.1 x 1.1 cm in the pancreatic  tail (series 6/ image 24), none of which demonstrate wall thickening or internal complexity. No pancreatic duct dilation. No pancreas divisum. No peripancreatic fluid collections. Spleen: Normal size. No mass. Adrenals/Urinary Tract: Left adrenal 2.0 cm nodule demonstrates  significant signal loss on chemical shift imaging, compatible with a benign left adrenal adenoma. No right adrenal nodules. No hydronephrosis. 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 There are numerous subcentimeter T2 hyperintense renal cortical lesions scattered throughout both kidneys, several of which demonstrate T1 hyperintensity, incompletely characterized on this scan. Solitary 0.7 cm T2 hypointense T1 hyperintense renal cortical lesion in the posterior lower left kidney (series 6/ image 42). Stomach/Bowel: Grossly normal stomach. Visualized small and large bowel is normal caliber, with no bowel wall thickening. Vascular/Lymphatic: Nonaneurysmal abdominal aorta. Porta hepatis adenopathy measuring up to 1.7 cm (series 8/ image 25). Mildly enlarged 1.0 cm gastrohepatic ligament node (series 8/ image 21). Other: Trace perihepatic ascites.  No focal fluid collections. Musculoskeletal: Expansile 3.7 x 3.1 cm osseous lesion in the anterior left fifth rib (series 7/image 7). IMPRESSION: 1. Cholelithiasis. Distended gallbladder. Mild diffuse gallbladder wall thickening. Findings suggest acute calculous cholecystitis in the correct clinical setting. 2. No intrahepatic biliary ductal dilatation. Common bile duct diameter 5 mm, decreased from 10 mm diameter on the CT study from 1 day prior. No evidence of choledocholithiasis. 3. Stable mild diffuse periportal edema, nonspecific, which could be due to IV hydration and/or ascending cholangitis. 4. Expansile 3.7 cm anterior left fifth rib bone lesion, indeterminate. Differential includes metastasis or plasmacytoma. Consider outpatient evaluation with PET-CT and/or bone biopsy, as clinically warranted. 5. Re- demonstration of 10 mm right lower lobe pulmonary nodule. Consider one of the following in 3 months for both low-risk and high-risk individuals: (a) repeat chest CT, (b) follow-up PET-CT, or (c) tissue sampling. This  recommendation follows the consensus statement: Guidelines for Management of Incidental Pulmonary Nodules Detected on CT Images: From the Fleischner Society 2017; Radiology 2017; 284:228-243. 6. Nonspecific mild upper retroperitoneal lymphadenopathy. Recommend attention on follow-up CT abdomen/pelvis with oral and IV contrast in 3 months. This recommendation follows ACR consensus guidelines: White Paper of the ACR Incidental Findings Committee II on Splenic and Nodal Findings. J Am Coll Radiol 2013;10:789-794. 7. Left adrenal adenoma. 8. Unilocular cystic pancreatic tail and distal pancreatic body lesions, largest 1.1 cm, without overt high risk MRI features on this noncontrast study. Follow-up MRI abdomen without and with IV contrast recommended in 2 years. This recommendation follows ACR consensus guidelines: Management of Incidental Pancreatic Cysts: A White Paper of the ACR Incidental Findings Committee. J Am Coll Radiol 0998;33:825-053. 9. Subcentimeter T1 hyperintense renal cortical lesions in both kidneys, incompletely characterized on this noncontrast study. Follow-up MRI abdomen without and with IV contrast recommended in 6-12 months. This recommendation follows ACR consensus guidelines: Management of the Incidental Renal Mass on CT: A White Paper of the ACR Incidental Findings Committee. J Am Coll Radiol [phone]. Electronically Signed   By: Ilona Sorrel M.D.   On: 11/07/2016 15:48   Mr 3d Recon At Scanner  Result Date: 11/07/2016 CLINICAL DATA:  Inpatient. Abnormal liver function tests. Cholelithiasis. Dilated common bile duct. No evidence of radiotracer excretion into the biliary tree or bowel at hepatobiliary scintigraphy study. EXAM: MRI ABDOMEN WITHOUT CONTRAST  (INCLUDING MRCP) TECHNIQUE: Multiplanar multisequence MR imaging of the abdomen was performed. Heavily T2-weighted images of the biliary and pancreatic ducts were obtained, and three-dimensional MRCP images were rendered by post  processing. COMPARISON:  11/06/2016 hepatobiliary scintigraphy study, CT abdomen/ pelvis and right upper quadrant abdominal sonogram. FINDINGS: Lower chest: Re- demonstration of 10 mm solid right lower lobe pulmonary nodule (series 6/ image 1). Mild dependent bibasilar atelectasis. Cardiomegaly. Hepatobiliary: Mild hepatomegaly. No definite liver surface irregularity. No hepatic steatosis. No liver mass. Distended gallbladder (4.4 cm diameter). Multiple subcentimeter gallstones are seen layering in the gallbladder and within the cystic duct. Mild diffuse gallbladder wall thickening. No significant pericholecystic fluid. Mild diffuse periportal edema, not appreciably changed since the CT study from 1 day prior. No intrahepatic biliary ductal dilatation . Common bile duct diameter 5 mm, decreased from 10 mm diameter on 11/06/2016 CT study. No convincing filling defects in the common bile duct to suggest choledocholithiasis. No biliary stricture. Probable tiny periampullary duodenal diverticulum. Pancreas: At least 4 scattered unilocular cystic lesions in the distal pancreatic body and tail, largest 1.1 x 1.1 cm in the pancreatic tail (series 6/ image 24), none of which demonstrate wall thickening or internal complexity. No pancreatic duct dilation. No pancreas divisum. No peripancreatic fluid collections. Spleen: Normal size. No mass. Adrenals/Urinary Tract: Left adrenal 2.0 cm nodule demonstrates significant signal loss on chemical shift imaging, compatible with a benign left adrenal adenoma. No right adrenal nodules. No hydronephrosis. 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 There are numerous subcentimeter T2 hyperintense renal cortical lesions scattered throughout both kidneys, several of which demonstrate T1 hyperintensity, incompletely characterized on this scan. Solitary 0.7 cm T2 hypointense T1 hyperintense renal cortical lesion in the posterior lower left  kidney (series 6/ image 42). Stomach/Bowel: Grossly normal stomach. Visualized small and large bowel is normal caliber, with no bowel wall thickening. Vascular/Lymphatic: Nonaneurysmal abdominal aorta. Porta hepatis adenopathy measuring up to 1.7 cm (series 8/ image 25). Mildly enlarged 1.0 cm gastrohepatic ligament node (series 8/ image 21). Other: Trace perihepatic ascites.  No focal fluid collections. Musculoskeletal: Expansile 3.7 x 3.1 cm osseous lesion in the anterior left fifth rib (series 7/image 7). IMPRESSION: 1. Cholelithiasis. Distended gallbladder. Mild diffuse gallbladder wall thickening. Findings suggest acute calculous cholecystitis in the correct clinical setting. 2. No intrahepatic biliary ductal dilatation. Common bile duct diameter 5 mm, decreased from 10 mm diameter on the CT study from 1 day prior. No evidence of choledocholithiasis. 3. Stable mild diffuse periportal edema, nonspecific, which could be due to IV hydration and/or ascending cholangitis. 4. Expansile 3.7 cm anterior left fifth rib bone lesion, indeterminate. Differential includes metastasis or plasmacytoma. Consider outpatient evaluation with PET-CT and/or bone biopsy, as clinically warranted. 5. Re- demonstration of 10 mm right lower lobe pulmonary nodule. Consider one of the following in 3 months for both low-risk and high-risk individuals: (a) repeat chest CT, (b) follow-up PET-CT, or (c) tissue sampling. This recommendation follows the consensus statement: Guidelines for Management of Incidental Pulmonary Nodules Detected on CT Images: From the Fleischner Society 2017; Radiology 2017; 284:228-243. 6. Nonspecific mild upper retroperitoneal lymphadenopathy. Recommend attention on follow-up CT abdomen/pelvis with oral and IV contrast in 3 months. This recommendation follows ACR consensus guidelines: White Paper of the ACR Incidental Findings Committee II on Splenic and Nodal Findings. J Am Coll Radiol 2013;10:789-794. 7. Left  adrenal adenoma. 8. Unilocular cystic pancreatic tail and distal pancreatic body lesions, largest 1.1 cm, without overt high risk MRI features on this noncontrast study. Follow-up MRI abdomen without and with IV contrast recommended in 2 years. This recommendation follows ACR consensus guidelines: Management of Incidental Pancreatic Cysts: A  White Paper of the ACR Incidental Findings Committee. J Am Coll Radiol 3543;01:484-039. 9. Subcentimeter T1 hyperintense renal cortical lesions in both kidneys, incompletely characterized on this noncontrast study. Follow-up MRI abdomen without and with IV contrast recommended in 6-12 months. This recommendation follows ACR consensus guidelines: Management of the Incidental Renal Mass on CT: A White Paper of the ACR Incidental Findings Committee. J Am Coll Radiol [phone]. Electronically Signed   By: Ilona Sorrel M.D.   On: 11/07/2016 15:48    Anti-infectives: Anti-infectives    Start     Dose/Rate Route Frequency Ordered Stop   11/06/16 0630  piperacillin-tazobactam (ZOSYN) IVPB 3.375 g     3.375 g 12.5 mL/hr over 240 Minutes Intravenous Every 8 hours 11/06/16 0620        Assessment/Plan:  Cholecystitis - clinically improving; awaiting cardiac clearance before proceeding with surgery.  If unable to tolerate surgery, percutaneous cholecystostomy. Hyperbilirubinemia - MRCP negative; will recheck in AM.  Plan cholangiogram if we are able to perform cholecystectomy.  LOS: 3 days    Mert Dietrick K. 11/09/2016

## 2016-11-09 NOTE — Progress Notes (Addendum)
Progress Note  Patient Name: Tara Serrano Date of Encounter: 11/09/2016  Primary Cardiologist: Greggory Brandy  Primary Electrophysiologist: JA   Patient Profile     71 y.o. female  hx of DM and HTN admitted with cholangitis who is being seen today for the evaluation of afib at the request of Dr .Cathlean Sauer. Found to have  1) modest LV dysfunction on echo EF 40-45% and myoview ordered in anticipation of surgery 2) profound hypokalemia on outpt lasix without K repletion   Subjective   Without complaint Stomach better Unaware of arrhythmia No SOB    Inpatient Medications    Scheduled Meds: . carvedilol  25 mg Oral BID WC  . heparin subcutaneous  5,000 Units Subcutaneous Q8H  . insulin aspart  0-9 Units Subcutaneous TID WC  . pantoprazole (PROTONIX) IV  40 mg Intravenous Q24H   Continuous Infusions: . piperacillin-tazobactam (ZOSYN)  IV 3.375 g (11/09/16 0511)   PRN Meds: fentaNYL (SUBLIMAZE) injection, hydrALAZINE   Vital Signs    Vitals:   11/08/16 1837 11/08/16 1900 11/08/16 2203 11/09/16 0515  BP: 133/80 122/64 (!) 119/59 136/70  Pulse: 84 81 80 78  Resp:    17  Temp:  98.3 F (36.8 C) 98.5 F (36.9 C) 98.1 F (36.7 C)  TempSrc:  Oral Oral Oral  SpO2:  94% 91% 93%  Weight:    195 lb 11.2 oz (88.8 kg)  Height:        Intake/Output Summary (Last 24 hours) at 11/09/16 0618 Last data filed at 11/09/16 0516  Gross per 24 hour  Intake              440 ml  Output             1725 ml  Net            -1285 ml   Filed Weights   11/07/16 0327 11/08/16 0500 11/09/16 0515  Weight: 189 lb 9.5 oz (86 kg) 192 lb 9 oz (87.3 kg) 195 lb 11.2 oz (88.8 kg)    Telemetry    Atrial fib with CVR - Personally Reviewed  ECG    none  Physical Exam   GEN: No acute distress.   Neck: JVD 7 Cardiac: irregularly irregular without murmurs, rubs, or gallops.  Respiratory: Clear to auscultation bilaterally. B Crackles GI: Soft, nontender, non-distended  MS: no edema; No  deformity. Neuro:  Nonfocal  Psych: Normal affect  Skin Warm and dry   Labs    Chemistry Recent Labs Lab 11/06/16 0223 11/07/16 0237 11/08/16 1046 11/09/16 0359  NA 136 140 144 142  K 2.6* 3.1* 3.1* 3.4*  CL 95* 98* 102 102  CO2 27 29 30  37*  GLUCOSE 251* 172* 71 66  BUN 25* 27* 30* 23*  CREATININE 1.33* 1.49* 1.30* 1.13*  CALCIUM 8.8* 8.1* 8.2* 8.4*  PROT 7.3 6.5 6.2*  --   ALBUMIN 3.2* 2.5* 2.2*  --   AST 246* 186* 63*  --   ALT 163* 189* 113*  --   ALKPHOS 399* 415* 395*  --   BILITOT 2.5* 5.6* 3.4*  --   GFRNONAA 39* 34* 40* 48*  GFRAA 45* 40* 47* 55*  ANIONGAP 14 13 12  3*     Hematology Recent Labs Lab 11/07/16 0237 11/08/16 1046 11/09/16 0359  WBC 15.6* 11.9* 9.8  RBC 3.82* 3.93 3.81*  HGB 10.7* 11.1* 10.6*  HCT 33.2* 35.1* 33.9*  MCV 86.9 89.3 89.0  MCH 28.0 28.2 27.8  MCHC  32.2 31.6 31.3  RDW 14.3 14.3 14.6  PLT 208 216 231    Cardiac Enzymes Recent Labs Lab 11/06/16 1035 11/06/16 1309 11/06/16 1953  TROPONINI 0.03* 0.05* 0.03*    Recent Labs Lab 11/06/16 0236  TROPIPOC 0.01     BNP Recent Labs Lab 11/06/16 0649  BNP 369.6*     DDimer No results for input(s): DDIMER in the last 168 hours.   Radiology    Mr Abdomen Mrcp Wo Contrast  Result Date: 11/07/2016 CLINICAL DATA:  Inpatient. Abnormal liver function tests. Cholelithiasis. Dilated common bile duct. No evidence of radiotracer excretion into the biliary tree or bowel at hepatobiliary scintigraphy study. EXAM: MRI ABDOMEN WITHOUT CONTRAST  (INCLUDING MRCP) TECHNIQUE: Multiplanar multisequence MR imaging of the abdomen was performed. Heavily T2-weighted images of the biliary and pancreatic ducts were obtained, and three-dimensional MRCP images were rendered by post processing. COMPARISON:  11/06/2016 hepatobiliary scintigraphy study, CT abdomen/ pelvis and right upper quadrant abdominal sonogram. FINDINGS: Lower chest: Re- demonstration of 10 mm solid right lower lobe pulmonary  nodule (series 6/ image 1). Mild dependent bibasilar atelectasis. Cardiomegaly. Hepatobiliary: Mild hepatomegaly. No definite liver surface irregularity. No hepatic steatosis. No liver mass. Distended gallbladder (4.4 cm diameter). Multiple subcentimeter gallstones are seen layering in the gallbladder and within the cystic duct. Mild diffuse gallbladder wall thickening. No significant pericholecystic fluid. Mild diffuse periportal edema, not appreciably changed since the CT study from 1 day prior. No intrahepatic biliary ductal dilatation . Common bile duct diameter 5 mm, decreased from 10 mm diameter on 11/06/2016 CT study. No convincing filling defects in the common bile duct to suggest choledocholithiasis. No biliary stricture. Probable tiny periampullary duodenal diverticulum. Pancreas: At least 4 scattered unilocular cystic lesions in the distal pancreatic body and tail, largest 1.1 x 1.1 cm in the pancreatic tail (series 6/ image 24), none of which demonstrate wall thickening or internal complexity. No pancreatic duct dilation. No pancreas divisum. No peripancreatic fluid collections. Spleen: Normal size. No mass. Adrenals/Urinary Tract: Left adrenal 2.0 cm nodule demonstrates significant signal loss on chemical shift imaging, compatible with a benign left adrenal adenoma. No right adrenal nodules. No hydronephrosis. 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 There are numerous subcentimeter T2 hyperintense renal cortical lesions scattered throughout both kidneys, several of which demonstrate T1 hyperintensity, incompletely characterized on this scan. Solitary 0.7 cm T2 hypointense T1 hyperintense renal cortical lesion in the posterior lower left kidney (series 6/ image 42). Stomach/Bowel: Grossly normal stomach. Visualized small and large bowel is normal caliber, with no bowel wall thickening. Vascular/Lymphatic: Nonaneurysmal abdominal aorta. Porta hepatis  adenopathy measuring up to 1.7 cm (series 8/ image 25). Mildly enlarged 1.0 cm gastrohepatic ligament node (series 8/ image 21). Other: Trace perihepatic ascites.  No focal fluid collections. Musculoskeletal: Expansile 3.7 x 3.1 cm osseous lesion in the anterior left fifth rib (series 7/image 7). IMPRESSION: 1. Cholelithiasis. Distended gallbladder. Mild diffuse gallbladder wall thickening. Findings suggest acute calculous cholecystitis in the correct clinical setting. 2. No intrahepatic biliary ductal dilatation. Common bile duct diameter 5 mm, decreased from 10 mm diameter on the CT study from 1 day prior. No evidence of choledocholithiasis. 3. Stable mild diffuse periportal edema, nonspecific, which could be due to IV hydration and/or ascending cholangitis. 4. Expansile 3.7 cm anterior left fifth rib bone lesion, indeterminate. Differential includes metastasis or plasmacytoma. Consider outpatient evaluation with PET-CT and/or bone biopsy, as clinically warranted. 5. Re- demonstration of 10 mm  right lower lobe pulmonary nodule. Consider one of the following in 3 months for both low-risk and high-risk individuals: (a) repeat chest CT, (b) follow-up PET-CT, or (c) tissue sampling. This recommendation follows the consensus statement: Guidelines for Management of Incidental Pulmonary Nodules Detected on CT Images: From the Fleischner Society 2017; Radiology 2017; 284:228-243. 6. Nonspecific mild upper retroperitoneal lymphadenopathy. Recommend attention on follow-up CT abdomen/pelvis with oral and IV contrast in 3 months. This recommendation follows ACR consensus guidelines: White Paper of the ACR Incidental Findings Committee II on Splenic and Nodal Findings. J Am Coll Radiol 2013;10:789-794. 7. Left adrenal adenoma. 8. Unilocular cystic pancreatic tail and distal pancreatic body lesions, largest 1.1 cm, without overt high risk MRI features on this noncontrast study. Follow-up MRI abdomen without and with IV contrast  recommended in 2 years. This recommendation follows ACR consensus guidelines: Management of Incidental Pancreatic Cysts: A White Paper of the ACR Incidental Findings Committee. J Am Coll Radiol 0867;61:950-932. 9. Subcentimeter T1 hyperintense renal cortical lesions in both kidneys, incompletely characterized on this noncontrast study. Follow-up MRI abdomen without and with IV contrast recommended in 6-12 months. This recommendation follows ACR consensus guidelines: Management of the Incidental Renal Mass on CT: A White Paper of the ACR Incidental Findings Committee. J Am Coll Radiol [phone]. Electronically Signed   By: Ilona Sorrel M.D.   On: 11/07/2016 15:48   Mr 3d Recon At Scanner  Result Date: 11/07/2016 CLINICAL DATA:  Inpatient. Abnormal liver function tests. Cholelithiasis. Dilated common bile duct. No evidence of radiotracer excretion into the biliary tree or bowel at hepatobiliary scintigraphy study. EXAM: MRI ABDOMEN WITHOUT CONTRAST  (INCLUDING MRCP) TECHNIQUE: Multiplanar multisequence MR imaging of the abdomen was performed. Heavily T2-weighted images of the biliary and pancreatic ducts were obtained, and three-dimensional MRCP images were rendered by post processing. COMPARISON:  11/06/2016 hepatobiliary scintigraphy study, CT abdomen/ pelvis and right upper quadrant abdominal sonogram. FINDINGS: Lower chest: Re- demonstration of 10 mm solid right lower lobe pulmonary nodule (series 6/ image 1). Mild dependent bibasilar atelectasis. Cardiomegaly. Hepatobiliary: Mild hepatomegaly. No definite liver surface irregularity. No hepatic steatosis. No liver mass. Distended gallbladder (4.4 cm diameter). Multiple subcentimeter gallstones are seen layering in the gallbladder and within the cystic duct. Mild diffuse gallbladder wall thickening. No significant pericholecystic fluid. Mild diffuse periportal edema, not appreciably changed since the CT study from 1 day prior. No intrahepatic biliary  ductal dilatation . Common bile duct diameter 5 mm, decreased from 10 mm diameter on 11/06/2016 CT study. No convincing filling defects in the common bile duct to suggest choledocholithiasis. No biliary stricture. Probable tiny periampullary duodenal diverticulum. Pancreas: At least 4 scattered unilocular cystic lesions in the distal pancreatic body and tail, largest 1.1 x 1.1 cm in the pancreatic tail (series 6/ image 24), none of which demonstrate wall thickening or internal complexity. No pancreatic duct dilation. No pancreas divisum. No peripancreatic fluid collections. Spleen: Normal size. No mass. Adrenals/Urinary Tract: Left adrenal 2.0 cm nodule demonstrates significant signal loss on chemical shift imaging, compatible with a benign left adrenal adenoma. No right adrenal nodules. No hydronephrosis. 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 There are numerous subcentimeter T2 hyperintense renal cortical lesions scattered throughout both kidneys, several of which demonstrate T1 hyperintensity, incompletely characterized on this scan. Solitary 0.7 cm T2 hypointense T1 hyperintense renal cortical lesion in the posterior lower left kidney (series 6/ image 42). Stomach/Bowel: Grossly normal stomach. Visualized small and large bowel is  normal caliber, with no bowel wall thickening. Vascular/Lymphatic: Nonaneurysmal abdominal aorta. Porta hepatis adenopathy measuring up to 1.7 cm (series 8/ image 25). Mildly enlarged 1.0 cm gastrohepatic ligament node (series 8/ image 21). Other: Trace perihepatic ascites.  No focal fluid collections. Musculoskeletal: Expansile 3.7 x 3.1 cm osseous lesion in the anterior left fifth rib (series 7/image 7). IMPRESSION: 1. Cholelithiasis. Distended gallbladder. Mild diffuse gallbladder wall thickening. Findings suggest acute calculous cholecystitis in the correct clinical setting. 2. No intrahepatic biliary ductal dilatation. Common  bile duct diameter 5 mm, decreased from 10 mm diameter on the CT study from 1 day prior. No evidence of choledocholithiasis. 3. Stable mild diffuse periportal edema, nonspecific, which could be due to IV hydration and/or ascending cholangitis. 4. Expansile 3.7 cm anterior left fifth rib bone lesion, indeterminate. Differential includes metastasis or plasmacytoma. Consider outpatient evaluation with PET-CT and/or bone biopsy, as clinically warranted. 5. Re- demonstration of 10 mm right lower lobe pulmonary nodule. Consider one of the following in 3 months for both low-risk and high-risk individuals: (a) repeat chest CT, (b) follow-up PET-CT, or (c) tissue sampling. This recommendation follows the consensus statement: Guidelines for Management of Incidental Pulmonary Nodules Detected on CT Images: From the Fleischner Society 2017; Radiology 2017; 284:228-243. 6. Nonspecific mild upper retroperitoneal lymphadenopathy. Recommend attention on follow-up CT abdomen/pelvis with oral and IV contrast in 3 months. This recommendation follows ACR consensus guidelines: White Paper of the ACR Incidental Findings Committee II on Splenic and Nodal Findings. J Am Coll Radiol 2013;10:789-794. 7. Left adrenal adenoma. 8. Unilocular cystic pancreatic tail and distal pancreatic body lesions, largest 1.1 cm, without overt high risk MRI features on this noncontrast study. Follow-up MRI abdomen without and with IV contrast recommended in 2 years. This recommendation follows ACR consensus guidelines: Management of Incidental Pancreatic Cysts: A White Paper of the ACR Incidental Findings Committee. J Am Coll Radiol 5400;86:761-950. 9. Subcentimeter T1 hyperintense renal cortical lesions in both kidneys, incompletely characterized on this noncontrast study. Follow-up MRI abdomen without and with IV contrast recommended in 6-12 months. This recommendation follows ACR consensus guidelines: Management of the Incidental Renal Mass on CT: A White  Paper of the ACR Incidental Findings Committee. J Am Coll Radiol [phone]. Electronically Signed   By: Ilona Sorrel M.D.   On: 11/07/2016 15:48   Dg Chest Port 1 View  Result Date: 11/07/2016 CLINICAL DATA:  Acute respiratory distress. EXAM: PORTABLE CHEST 1 VIEW COMPARISON:  One-view chest x-ray 11/06/2016. FINDINGS: The heart size is exaggerated by low lung volumes. Aeration of both lungs is improving. Persistent lower lobe airspace disease is noted, left greater than right. Mild pulmonary vascular congestion is noted as well. IMPRESSION: 1. Improving aeration. 2. Mild pulmonary vascular congestion remains. 3. Left greater than right residual basilar airspace disease. This is concerning for left lower lobe pneumonia. Electronically Signed   By: San Morelle M.D.   On: 11/07/2016 08:41    Cardiac Studies   Echo - Left ventricle: The cavity size was normal. Wall thickness was   normal. Systolic function was mildly reduced. The estimated   ejection fraction was in the range of 45% to 50%. Diffuse   hypokinesis. The study is not technically sufficient to allow   evaluation of LV diastolic function. - Aortic valve: Trileaflet. Sclerosis without stenosis. There was   no regurgitation. - Mitral valve: Mildly thickened leaflets . There was trivial   regurgitation. - Left atrium: Moderately dilated. - Right ventricle: The cavity size was mildly dilated. Mildly  reduced systolic function. - Right atrium: Moderately dilated.     Assessment & Plan    Atrial fib new onset persistent  LY dysfunction mild-mod  Acute/chronic CHF systolic diastolic  Anemia  Hypokalemia    Sleep disordered breathing  Right Heart dilitation and weakness  Abnormal lung exam  Replete K\  Myoview ordered for am   Will need outpt sleep study And possibly RHC   Euvolemic  Anemic without clear cause Will check ferritin   2 V cxr   Signed, Virl Axe, MD  11/09/2016, 6:18 AM

## 2016-11-09 NOTE — Progress Notes (Signed)
Patient transported to [facility] 

## 2016-11-09 NOTE — Progress Notes (Signed)
Myoview completed-final report pending  Kerin Ransom PA-C 11/09/2016 10:44 AM

## 2016-11-10 ENCOUNTER — Inpatient Hospital Stay (HOSPITAL_COMMUNITY): Payer: Medicare Other

## 2016-11-10 ENCOUNTER — Encounter (HOSPITAL_COMMUNITY)
Admission: EM | Disposition: A | Discharge status: Home / Self Care | Payer: Self-pay | Source: Home / Self Care | Attending: Internal Medicine

## 2016-11-10 ENCOUNTER — Encounter (HOSPITAL_COMMUNITY): Payer: Self-pay | Admitting: Certified Registered Nurse Anesthetist

## 2016-11-10 ENCOUNTER — Inpatient Hospital Stay (HOSPITAL_COMMUNITY): Payer: Medicare Other | Admitting: Certified Registered Nurse Anesthetist

## 2016-11-10 HISTORY — PX: CHOLECYSTECTOMY: SHX55

## 2016-11-10 LAB — HEPATIC FUNCTION PANEL
ALBUMIN: 2.2 g/dL — AB (ref 3.5–5.0)
ALK PHOS: 367 U/L — AB (ref 38–126)
ALT: 60 U/L — AB (ref 14–54)
AST: 39 U/L (ref 15–41)
BILIRUBIN TOTAL: 3.6 mg/dL — AB (ref 0.3–1.2)
Bilirubin, Direct: 0.9 mg/dL — ABNORMAL HIGH (ref 0.1–0.5)
Indirect Bilirubin: 2.7 mg/dL — ABNORMAL HIGH (ref 0.3–0.9)
TOTAL PROTEIN: 6 g/dL — AB (ref 6.5–8.1)

## 2016-11-10 LAB — CBC WITH DIFFERENTIAL/PLATELET
BASOS PCT: 0 %
Basophils Absolute: 0 10*3/uL (ref 0.0–0.1)
EOS ABS: 0.2 10*3/uL (ref 0.0–0.7)
Eosinophils Relative: 3 %
HEMATOCRIT: 36.6 % (ref 36.0–46.0)
HEMOGLOBIN: 11.5 g/dL — AB (ref 12.0–15.0)
Lymphocytes Relative: 15 %
Lymphs Abs: 1.4 10*3/uL (ref 0.7–4.0)
MCH: 28.1 pg (ref 26.0–34.0)
MCHC: 31.4 g/dL (ref 30.0–36.0)
MCV: 89.5 fL (ref 78.0–100.0)
MONOS PCT: 9 %
Monocytes Absolute: 0.8 10*3/uL (ref 0.1–1.0)
NEUTROS ABS: 6.8 10*3/uL (ref 1.7–7.7)
NEUTROS PCT: 73 %
Platelets: 264 10*3/uL (ref 150–400)
RBC: 4.09 MIL/uL (ref 3.87–5.11)
RDW: 14.5 % (ref 11.5–15.5)
WBC: 9.3 10*3/uL (ref 4.0–10.5)

## 2016-11-10 LAB — BASIC METABOLIC PANEL
ANION GAP: 8 (ref 5–15)
BUN: 15 mg/dL (ref 6–20)
CALCIUM: 8.6 mg/dL — AB (ref 8.9–10.3)
CHLORIDE: 100 mmol/L — AB (ref 101–111)
CO2: 31 mmol/L (ref 22–32)
Creatinine, Ser: 1.06 mg/dL — ABNORMAL HIGH (ref 0.44–1.00)
GFR calc non Af Amer: 52 mL/min — ABNORMAL LOW (ref >60)
GFR, EST AFRICAN AMERICAN: 60 mL/min — AB (ref >60)
Glucose, Bld: 80 mg/dL (ref 65–99)
Potassium: 5.2 mmol/L — ABNORMAL HIGH (ref 3.5–5.1)
SODIUM: 139 mmol/L (ref 135–145)

## 2016-11-10 LAB — GLUCOSE, CAPILLARY
GLUCOSE-CAPILLARY: 163 mg/dL — AB (ref 65–99)
Glucose-Capillary: 79 mg/dL (ref 65–99)
Glucose-Capillary: 88 mg/dL (ref 65–99)
Glucose-Capillary: 102 mg/dL — ABNORMAL HIGH (ref 65–99)
Glucose-Capillary: 151 mg/dL — ABNORMAL HIGH (ref 65–99)
Glucose-Capillary: 261 mg/dL — ABNORMAL HIGH (ref 65–99)

## 2016-11-10 SURGERY — LAPAROSCOPIC CHOLECYSTECTOMY WITH INTRAOPERATIVE CHOLANGIOGRAM
Anesthesia: General | Site: Abdomen

## 2016-11-10 MED ORDER — SUGAMMADEX SODIUM 200 MG/2ML IV SOLN
INTRAVENOUS | Status: DC | PRN
  Administered 2016-11-10: 180 mg via INTRAVENOUS

## 2016-11-10 MED ORDER — LIDOCAINE HCL (CARDIAC) 20 MG/ML IV SOLN
INTRAVENOUS | Status: DC | PRN
  Administered 2016-11-10: 100 mg via INTRAVENOUS

## 2016-11-10 MED ORDER — HYDROMORPHONE HCL 1 MG/ML IJ SOLN
0.2500 mg | INTRAMUSCULAR | Status: DC | PRN
  Administered 2016-11-10 (×2): 0.25 mg via INTRAVENOUS
  Administered 2016-11-10: 0.5 mg via INTRAVENOUS

## 2016-11-10 MED ORDER — SUGAMMADEX SODIUM 200 MG/2ML IV SOLN
INTRAVENOUS | Status: AC
  Filled 2016-11-10: qty 2

## 2016-11-10 MED ORDER — LIDOCAINE HCL 1 % IJ SOLN
INTRAMUSCULAR | Status: DC | PRN
  Administered 2016-11-10: 9 mL

## 2016-11-10 MED ORDER — FENTANYL CITRATE (PF) 250 MCG/5ML IJ SOLN
INTRAMUSCULAR | Status: AC
  Filled 2016-11-10: qty 5

## 2016-11-10 MED ORDER — ACETAMINOPHEN 325 MG PO TABS: 650.0000 mg | ORAL_TABLET | Freq: Four times a day (QID) | ORAL | Status: DC | PRN

## 2016-11-10 MED ORDER — LACTATED RINGERS IV SOLN
INTRAVENOUS | Status: DC | PRN
  Administered 2016-11-10: 11:00:00 via INTRAVENOUS

## 2016-11-10 MED ORDER — DEXAMETHASONE SODIUM PHOSPHATE 10 MG/ML IJ SOLN
INTRAMUSCULAR | Status: AC
  Filled 2016-11-10: qty 1

## 2016-11-10 MED ORDER — HYDRALAZINE HCL 20 MG/ML IJ SOLN: 10.0000 mg | INTRAMUSCULAR | Status: DC | PRN

## 2016-11-10 MED ORDER — TRAMADOL HCL 50 MG PO TABS
50.0000 mg | ORAL_TABLET | Freq: Four times a day (QID) | ORAL | Status: DC | PRN
  Administered 2016-11-11: 50 mg via ORAL
  Filled 2016-11-10: qty 1

## 2016-11-10 MED ORDER — PROPOFOL 10 MG/ML IV BOLUS
INTRAVENOUS | Status: AC
  Filled 2016-11-10: qty 20

## 2016-11-10 MED ORDER — 0.9 % SODIUM CHLORIDE (POUR BTL) OPTIME
TOPICAL | Status: DC | PRN
  Administered 2016-11-10: 1000 mL

## 2016-11-10 MED ORDER — ROCURONIUM BROMIDE 100 MG/10ML IV SOLN
INTRAVENOUS | Status: DC | PRN
  Administered 2016-11-10: 50 mg via INTRAVENOUS

## 2016-11-10 MED ORDER — OXYCODONE HCL 5 MG PO TABS: 5.0000 mg | ORAL_TABLET | ORAL | Status: DC | PRN

## 2016-11-10 MED ORDER — HEPARIN SODIUM (PORCINE) 5000 UNIT/ML IJ SOLN
5000.0000 [IU] | Freq: Three times a day (TID) | INTRAMUSCULAR | Status: DC
  Administered 2016-11-11 – 2016-11-12 (×4): 5000 [IU] via SUBCUTANEOUS
  Filled 2016-11-10 (×4): qty 1

## 2016-11-10 MED ORDER — HYDROMORPHONE HCL 1 MG/ML IJ SOLN
INTRAMUSCULAR | Status: AC
  Filled 2016-11-10: qty 1

## 2016-11-10 MED ORDER — ONDANSETRON HCL 4 MG/2ML IJ SOLN
INTRAMUSCULAR | Status: DC | PRN
  Administered 2016-11-10: 4 mg via INTRAVENOUS

## 2016-11-10 MED ORDER — SODIUM CHLORIDE 0.9 % IR SOLN
Status: DC | PRN
Start: 2016-11-10 — End: 2016-11-10
  Administered 2016-11-10: 1000 mL

## 2016-11-10 MED ORDER — ONDANSETRON HCL 4 MG/2ML IJ SOLN
INTRAMUSCULAR | Status: AC
  Filled 2016-11-10: qty 2

## 2016-11-10 MED ORDER — FENTANYL CITRATE (PF) 100 MCG/2ML IJ SOLN
INTRAMUSCULAR | Status: DC | PRN
  Administered 2016-11-10: 100 ug via INTRAVENOUS
  Administered 2016-11-10 (×3): 50 ug via INTRAVENOUS

## 2016-11-10 MED ORDER — PROPOFOL 10 MG/ML IV BOLUS
INTRAVENOUS | Status: DC | PRN
  Administered 2016-11-10: 140 mg via INTRAVENOUS

## 2016-11-10 MED ORDER — DEXAMETHASONE SODIUM PHOSPHATE 10 MG/ML IJ SOLN
INTRAMUSCULAR | Status: DC | PRN
  Administered 2016-11-10: 5 mg via INTRAVENOUS

## 2016-11-10 MED ORDER — SODIUM CHLORIDE 0.9 % IV SOLN
INTRAVENOUS | Status: DC | PRN
  Administered 2016-11-10: 17 mL

## 2016-11-10 SURGICAL SUPPLY — 41 items
APPLIER CLIP ROT 10 11.4 M/L (STAPLE) ×3
BLADE CLIPPER SURG (BLADE) IMPLANT
CANISTER SUCT 3000ML PPV (MISCELLANEOUS) ×3 IMPLANT
CHLORAPREP W/TINT 26ML (MISCELLANEOUS) ×3 IMPLANT
CLIP APPLIE ROT 10 11.4 M/L (STAPLE) ×1 IMPLANT
COVER MAYO STAND STRL (DRAPES) ×3 IMPLANT
COVER SURGICAL LIGHT HANDLE (MISCELLANEOUS) ×3 IMPLANT
DERMABOND ADVANCED (GAUZE/BANDAGES/DRESSINGS) ×2
DERMABOND ADVANCED .7 DNX12 (GAUZE/BANDAGES/DRESSINGS) ×1 IMPLANT
DRAPE C-ARM 42X72 X-RAY (DRAPES) ×3 IMPLANT
DRAPE WARM FLUID 44X44 (DRAPE) ×3 IMPLANT
ELECT REM PT RETURN 9FT ADLT (ELECTROSURGICAL) ×3
ELECTRODE REM PT RTRN 9FT ADLT (ELECTROSURGICAL) ×1 IMPLANT
FILTER SMOKE EVAC LAPAROSHD (FILTER) IMPLANT
GLOVE BIO SURGEON STRL SZ 6 (GLOVE) ×3 IMPLANT
GLOVE BIOGEL PI IND STRL 6.5 (GLOVE) ×1 IMPLANT
GLOVE BIOGEL PI INDICATOR 6.5 (GLOVE) ×2
GOWN STRL REUS W/ TWL LRG LVL3 (GOWN DISPOSABLE) ×2 IMPLANT
GOWN STRL REUS W/TWL 2XL LVL3 (GOWN DISPOSABLE) ×3 IMPLANT
GOWN STRL REUS W/TWL LRG LVL3 (GOWN DISPOSABLE) ×4
KIT BASIN OR (CUSTOM PROCEDURE TRAY) ×3 IMPLANT
KIT ROOM TURNOVER OR (KITS) ×3 IMPLANT
L-HOOK LAP DISP 36CM (ELECTROSURGICAL) ×3
LHOOK LAP DISP 36CM (ELECTROSURGICAL) ×1 IMPLANT
NS IRRIG 1000ML POUR BTL (IV SOLUTION) ×3 IMPLANT
PAD ARMBOARD 7.5X6 YLW CONV (MISCELLANEOUS) ×3 IMPLANT
PENCIL BUTTON HOLSTER BLD 10FT (ELECTRODE) ×3 IMPLANT
POUCH SPECIMEN RETRIEVAL 10MM (ENDOMECHANICALS) ×3 IMPLANT
SCISSORS LAP 5X35 DISP (ENDOMECHANICALS) ×3 IMPLANT
SET CHOLANGIOGRAPH 5 50 .035 (SET/KITS/TRAYS/PACK) ×3 IMPLANT
SET IRRIG TUBING LAPAROSCOPIC (IRRIGATION / IRRIGATOR) ×3 IMPLANT
SLEEVE ENDOPATH XCEL 5M (ENDOMECHANICALS) ×3 IMPLANT
SPECIMEN JAR SMALL (MISCELLANEOUS) ×3 IMPLANT
SUT MNCRL AB 4-0 PS2 18 (SUTURE) ×3 IMPLANT
TOWEL OR 17X24 6PK STRL BLUE (TOWEL DISPOSABLE) ×3 IMPLANT
TOWEL OR 17X26 10 PK STRL BLUE (TOWEL DISPOSABLE) ×3 IMPLANT
TRAY LAPAROSCOPIC MC (CUSTOM PROCEDURE TRAY) ×3 IMPLANT
TROCAR XCEL BLUNT TIP 100MML (ENDOMECHANICALS) ×3 IMPLANT
TROCAR XCEL NON-BLD 11X100MML (ENDOMECHANICALS) ×3 IMPLANT
TROCAR XCEL NON-BLD 5MMX100MML (ENDOMECHANICALS) ×3 IMPLANT
TUBING INSUFFLATION (TUBING) ×3 IMPLANT

## 2016-11-10 NOTE — Progress Notes (Signed)
Progress Note  Patient Name: Tara Serrano Date of Encounter: 11/10/2016  Primary Cardiologist: Dr. Rayann Heman  Subjective   Lethergic and sleepy after surgery.   Inpatient Medications    Scheduled Meds: . carvedilol  25 mg Oral BID WC  . furosemide  20 mg Oral Daily  . [START ON 11/11/2016] heparin subcutaneous  5,000 Units Subcutaneous Q8H  . HYDROmorphone      . insulin aspart  0-9 Units Subcutaneous TID WC  . pantoprazole (PROTONIX) IV  40 mg Intravenous Q24H   Continuous Infusions: . piperacillin-tazobactam (ZOSYN)  IV Stopped (11/10/16 0905)   PRN Meds: fentaNYL (SUBLIMAZE) injection, hydrALAZINE   Vital Signs    Vitals:   11/10/16 1300 11/10/16 1310 11/10/16 1315 11/10/16 1339  BP:  (!) 151/73  (!) 147/68  Pulse: 81 73 76 74  Resp: 19 18 (!) 21 16  Temp:    97.8 F (36.6 C)  TempSrc:    Oral  SpO2: 94% 95% 95% 94%  Weight:      Height:        Intake/Output Summary (Last 24 hours) at 11/10/16 1403 Last data filed at 11/10/16 1233  Gross per 24 hour  Intake             1240 ml  Output             3650 ml  Net            -2410 ml   Filed Weights   11/08/16 0500 11/09/16 0515 11/10/16 0500  Weight: 192 lb 9 oz (87.3 kg) 195 lb 11.2 oz (88.8 kg) 194 lb (88 kg)    Telemetry    afib at controlled ventricular rate - Personally Reviewed  ECG    None today   Physical Exam   GEN: lathergic due to anesthesia and pain medications  Neck: No JVD Cardiac: RRR, no murmurs, rubs, or gallops.  Respiratory: Clear to auscultation bilaterally. GI: Soft, with 2 surgical scar MS: No edema; No deformity. Neuro:  Nonfocal  Psych: sleepy  Labs    Chemistry Recent Labs Lab 11/07/16 0237 11/08/16 1046 11/09/16 0359 11/10/16 0307  NA 140 144 142 139  K 3.1* 3.1* 3.4* 5.2*  CL 98* 102 102 100*  CO2 29 30 37* 31  GLUCOSE 172* 71 66 80  BUN 27* 30* 23* 15  CREATININE 1.49* 1.30* 1.13* 1.06*  CALCIUM 8.1* 8.2* 8.4* 8.6*  PROT 6.5 6.2*  --  6.0*  ALBUMIN  2.5* 2.2*  --  2.2*  AST 186* 63*  --  39  ALT 189* 113*  --  60*  ALKPHOS 415* 395*  --  367*  BILITOT 5.6* 3.4*  --  3.6*  GFRNONAA 34* 40* 48* 52*  GFRAA 40* 47* 55* 60*  ANIONGAP 13 12 3* 8     Hematology Recent Labs Lab 11/08/16 1046 11/09/16 0359 11/10/16 0307  WBC 11.9* 9.8 9.3  RBC 3.93 3.81* 4.09  HGB 11.1* 10.6* 11.5*  HCT 35.1* 33.9* 36.6  MCV 89.3 89.0 89.5  MCH 28.2 27.8 28.1  MCHC 31.6 31.3 31.4  RDW 14.3 14.6 14.5  PLT 216 231 264    Cardiac Enzymes Recent Labs Lab 11/06/16 1035 11/06/16 1309 11/06/16 1953  TROPONINI 0.03* 0.05* 0.03*    Recent Labs Lab 11/06/16 0236  TROPIPOC 0.01     BNP Recent Labs Lab 11/06/16 0649  BNP 369.6*     DDimer No results for input(s): DDIMER in the last 168 hours.  Radiology    Dg Chest 2 View  Result Date: 11/09/2016 CLINICAL DATA:  Pleuritic pain EXAM: CHEST  2 VIEW COMPARISON:  Chest CT 11/06/2016 FINDINGS: Stable enlarged cardiac silhouette. No effusion, infiltrate pneumothorax. Small effusions and low lung volumes. Remote LEFT rib fracture. IMPRESSION: Basilar atelectasis and small effusions. Electronically Signed   By: Suzy Bouchard M.D.   On: 11/09/2016 08:18   Dg Cholangiogram Operative  Result Date: 11/10/2016 CLINICAL DATA:  Intraoperative cholangiogram during laparoscopic cholecystectomy. EXAM: INTRAOPERATIVE CHOLANGIOGRAM FLUOROSCOPY TIME:  1 minute, 38 seconds COMPARISON:  MRCP - 11/07/2016 FINDINGS: Intraoperative cholangiographic images of the right upper abdominal quadrant during laparoscopic cholecystectomy are provided for review. Surgical clips overlie the expected location of the gallbladder fossa. Initial attempted cholangiogram demonstrates extravasation of contrast about the gallbladder fossa. Subsequent contrast injection demonstrates opacification of the cystic duct with minimal amount of additional extravasation. There is passage of contrast through the central aspect of the cystic  duct with filling of a non dilated common bile duct. There is passage of contrast though the CBD and into the descending portion of the duodenum. There is minimal reflux of injected contrast into the common hepatic duct and central aspect of the non dilated intrahepatic biliary system. Apparent filling defect within the mid aspect the common bile duct is favored to represent the right L2 pedicle and not a discrete intraluminal filling defect. No definitive persistent filling defects within the opacified portion of the biliary tree to suggest choledocholithiasis. IMPRESSION: Degraded examination without definitive evidence of choledocholithiasis. Electronically Signed   By: Sandi Mariscal M.D.   On: 11/10/2016 12:49   Nm Myocar Multi W/spect W/wall Motion / Ef  Result Date: 11/09/2016  There was no ST segment deviation noted during stress.  No T wave inversion was noted during stress.  Defect 1: There is a small defect of mild severity present in the apical septal location.  Findings consistent with ischemia.  This is a low risk study.  The left ventricular ejection fraction is mildly decreased (45-54%).  Nuclear stress EF: 50%.  Low risk stress nuclear study with a small area of apicoseptal ischemia and borderline left ventricular systolic function.    Cardiac Studies  Echo 11/07/16 Study Conclusions  - Procedure narrative: Transthoracic echocardiography. Technically   difficult study. Intravenous contrast (Definity) was   administered. - Left ventricle: The cavity size was normal. Wall thickness was   normal. Systolic function was mildly reduced. The estimated   ejection fraction was in the range of 45% to 50%. Diffuse   hypokinesis. The study is not technically sufficient to allow   evaluation of LV diastolic function. - Aortic valve: Trileaflet. Sclerosis without stenosis. There was   no regurgitation. - Mitral valve: Mildly thickened leaflets . There was trivial   regurgitation. - Left  atrium: Moderately dilated. - Right ventricle: The cavity size was mildly dilated. Mildly   reduced systolic function. - Right atrium: Moderately dilated. - Tricuspid valve: There was trivial regurgitation. - Pulmonary arteries: PA peak pressure: 36 mm Hg (S). - Inferior vena cava: The vessel was dilated. The respirophasic   diameter changes were blunted (< 50%), consistent with elevated   central venous pressure. - Pericardium, extracardiac: A trivial pericardial effusion was   identified posterior to the heart.  Impressions:  - Technically difficult study. Definity contrast given. LVEF   45-50%, normal wall thickness, global hypokinesis, trivial MR,   moderate biatrial enlargement, dilated RV with mild RV systolic   dysfunction, trivial TR, RVSP  36 mmHg, dilated IVC, trivial   posterior pericardial effusion.  Stress test 11/09/16   There was no ST segment deviation noted during stress.  No T wave inversion was noted during stress.  Defect 1: There is a small defect of mild severity present in the apical septal location.  Findings consistent with ischemia.  This is a low risk study.  The left ventricular ejection fraction is mildly decreased (45-54%).  Nuclear stress EF: 50%.   Low risk stress nuclear study with a small area of apicoseptal ischemia and borderline left ventricular systolic function.   Patient Profile     71 y.o. female hx of DM and HTNadmitted with cholangitis . Cardiology is asked to see for afib. Found to have LVEF of 40-45% on echo. Also had profound hypokalemia.  Assessment & Plan    1. Persistent atrial fibrillation - Rate stable. CHADSVASC score of 5. She will need long term anticoagulation when stable from surgical stand point.   2. Acute systolic CHF - unclear etiology. Echo with LVEF of 45-50%, normal wall thickness, global hypokinesis, trivial MR,   moderate biatrial enlargement, dilated RV with mild RV systolic dysfunction, trivial TR,  RVSP 36 mmHg, dilated IVC, trivial posterior pericardial effusion. - Stress test low risk with with a small area of apicoseptal ischemia. Will review plan with MD.   3. Hypertensive heart disease with CHF - BP relatively stable.    4. Cholecystitis - S/p laparoscopic cholecystectomy today. CA 19-9 elevated.   5. DM - per primary team Signed, Madrone, [place]  11/10/2016, 2:03 PM    History and all data above reviewed.  Patient examined.  The patient denies any chest pain or SOB.  She had a low risk stress test mildly reduced EF on echo.  She has had no obvious complications with surgery.   I agree with the findings as above.  The patient exam reveals ONG:[id number]  ,  Lungs: Clear  ,  Abd: decreased bowel sounds, Ext   Trace edema  .  All available labs, radiology testing, previous records reviewed. Agree with documented assessment and plan. CHF:  Seems to be euvolemic.  Careful follow up of I/Os.   Net 4.8 negative.  Continue current meds.  She will need rate control and DOAC for her atrial fib.  We will wait for the surgeons to tell us when we can safely start this.     Jeneen Rinks Lamere Lightner  3:04 PM  11/10/2016

## 2016-11-10 NOTE — Anesthesia Procedure Notes (Signed)
Procedure Name: Intubation Date/Time: 11/10/2016 11:11 AM Performed by: Salli Quarry Balian Schaller Pre-anesthesia Checklist: Patient identified, Emergency Drugs available, Suction available and Patient being monitored Patient Re-evaluated:Patient Re-evaluated prior to induction Oxygen Delivery Method: Circle System Utilized Preoxygenation: Pre-oxygenation with 100% oxygen Induction Type: IV induction Ventilation: Mask ventilation without difficulty and Oral airway inserted - appropriate to patient size Laryngoscope Size: Mac and 3 Grade View: Grade II Tube type: Oral Tube size: 7.0 mm Number of attempts: 1 Airway Equipment and Method: Stylet and Oral airway Placement Confirmation: ETT inserted through vocal cords under direct vision,  positive ETCO2 and breath sounds checked- equal and bilateral Secured at: 21 cm Tube secured with: Tape Dental Injury: Teeth and Oropharynx as per pre-operative assessment

## 2016-11-10 NOTE — Op Note (Signed)
Laparoscopic Cholecystectomy with IOC Procedure Note  Indications: This patient presents with acute calculous cholecystitis and will undergo laparoscopic cholecystectomy.  Pre-operative Diagnosis: acute calculous cholecystitis  Post-operative Diagnosis: Same  Surgeon: Stark Klein   Assistants: n/a  Anesthesia: General endotracheal anesthesia and local  ASA Class: 3  Procedure Details  The patient was seen again in the Holding Room. The risks, benefits, complications, treatment options, and expected outcomes were discussed with the patient. The possibilities of  bleeding, recurrent infection, damage to nearby structures, the need for additional procedures, failure to diagnose a condition, the possible need to convert to an open procedure, and creating a complication requiring transfusion or operation were discussed with the patient. The likelihood of improving the patient's symptoms with return to their baseline status is good.    The patient and/or family concurred with the proposed plan, giving informed consent. The site of surgery properly noted. The patient was taken to Operating Room, and the procedure verified as Laparoscopic Cholecystectomy with Intraoperative Cholangiogram. A Time Out was held and the above information confirmed.  Prior to the induction of general anesthesia, antibiotic prophylaxis was administered. General endotracheal anesthesia was then administered and tolerated well. After the induction, the abdomen was prepped with Chloraprep and draped in the sterile fashion. The patient was positioned in the supine position.  Local anesthetic agent was injected into the skin near the umbilicus and an incision made. We dissected down to the abdominal fascia with blunt dissection.  The fascia was incised vertically and we entered the peritoneal cavity bluntly.  A pursestring suture of 0-Vicryl was placed around the fascial opening.  The Hasson cannula was inserted and secured  with the stay suture.  Pneumoperitoneum was then created with CO2 and tolerated well without any adverse changes in the patient's vital signs. An 11-mm port was placed in the subxiphoid position.  Two 5-mm ports were placed in the right upper quadrant. All skin incisions were infiltrated with a local anesthetic agent before making the incision and placing the trocars.   We positioned the patient in reverse Trendelenburg, tilted slightly to the patient's left.  The gallbladder was identified, the fundus grasped and retracted cephalad. Adhesions were lysed bluntly and with the electrocautery where indicated, taking care not to injure any adjacent organs or viscus. The infundibulum was grasped and retracted laterally, exposing the peritoneum overlying the triangle of Calot. This was then divided and exposed in a blunt fashion. A critical view of the cystic duct and cystic artery was obtained.  The cystic duct was clearly identified and bluntly dissected circumferentially. The cystic duct was ligated with a clip distally.   An incision was made in the cystic duct and the [facility] cholangiogram catheter introduced. The catheter was secured using a clip. A cholangiogram was then performed, demonstrating good filling of a tortuous cystic duct, common bile duct, and duodenum without filling defect.  The cystic duct was then ligated with clips and divided. The cystic artery was identified, dissected free, ligated with clips and divided as well.   The gallbladder was dissected from the liver bed in retrograde fashion with the electrocautery. The gallbladder was removed and placed in an Endocatch bag.  The gallbladder and Endocatch bag were then removed through the umbilical port site.  The liver bed was irrigated and inspected. Hemostasis was achieved with the electrocautery. Copious irrigation was utilized and was repeatedly aspirated until clear.    We again inspected the right upper quadrant for hemostasis.   Pneumoperitoneum was released as  we removed the trocars.   The pursestring suture was used to close the umbilical fascia.  4-0 Monocryl was used to close the skin.   The skin was cleaned and dry, and Dermabond was applied. The patient was then extubated and brought to the recovery room in stable condition. Instrument, sponge, and needle counts were correct at closure and at the conclusion of the case.   Findings: Acutely inflamed gallbladder, moderately distended.    Estimated Blood Loss: min         Drains: none          Specimens: Gallbladder to pathology       Complications: None; patient tolerated the procedure well.         Disposition: PACU - hemodynamically stable.         Condition: stable

## 2016-11-10 NOTE — Transfer of Care (Signed)
Immediate Anesthesia Transfer of Care Note  Patient: Tara Serrano  Procedure(s) Performed: Procedure(s): LAPAROSCOPIC CHOLECYSTECTOMY WITH INTRAOPERATIVE CHOLANGIOGRAM (N/A)  Patient Location: PACU  Anesthesia Type:General  Level of Consciousness: awake, alert , oriented and patient cooperative  Airway & Oxygen Therapy: Patient Spontanous Breathing and Patient connected to nasal cannula oxygen  Post-op Assessment: Report given to RN and Post -op Vital signs reviewed and stable  Post vital signs: Reviewed and stable  Last Vitals:  Vitals:   11/09/16 2222 11/10/16 0500  BP: 129/73 (!) 144/78  Pulse: 79 81  Resp: 18 18  Temp: (!) 36.4 C 36.6 C  SpO2: 97% 95%    Last Pain:  Vitals:   11/10/16 0500  TempSrc: Oral  PainSc:          Complications: No apparent anesthesia complications

## 2016-11-10 NOTE — Progress Notes (Signed)
Report received from [place], RN on 6N.

## 2016-11-10 NOTE — Anesthesia Preprocedure Evaluation (Addendum)
Anesthesia Evaluation  Patient identified by MRN, date of birth, ID band Patient awake    Reviewed: Allergy & Precautions, H&P , NPO status , Patient's Chart, lab work & pertinent test results, reviewed documented beta blocker date and time   Airway Mallampati: III  TM Distance: >3 FB Neck ROM: Full    Dental no notable dental hx. (+) Teeth Intact, Dental Advisory Given   Pulmonary neg pulmonary ROS,    Pulmonary exam normal breath sounds clear to auscultation       Cardiovascular hypertension, Pt. on medications and Pt. on home beta blockers  Rhythm:Regular Rate:Normal     Neuro/Psych negative neurological ROS  negative psych ROS   GI/Hepatic Neg liver ROS, GERD  Medicated and Controlled,  Endo/Other  diabetes, Type 2, Oral Hypoglycemic AgentsMorbid obesity  Renal/GU negative Renal ROS  negative genitourinary   Musculoskeletal   Abdominal   Peds  Hematology negative hematology ROS (+)   Anesthesia Other Findings   Reproductive/Obstetrics negative OB ROS                            Anesthesia Physical Anesthesia Plan  ASA: III  Anesthesia Plan: General   Post-op Pain Management:    Induction: Intravenous  PONV Risk Score and Plan: 4 or greater and Ondansetron, Dexamethasone, Midazolam and Treatment may vary due to age or medical condition  Airway Management Planned: Oral ETT  Additional Equipment:   Intra-op Plan:   Post-operative Plan: Extubation in OR  Informed Consent: I have reviewed the patients History and Physical, chart, labs and discussed the procedure including the risks, benefits and alternatives for the proposed anesthesia with the patient or authorized representative who has indicated his/her understanding and acceptance.   Dental advisory given  Plan Discussed with: CRNA  Anesthesia Plan Comments:         Anesthesia Quick Evaluation

## 2016-11-10 NOTE — Care Management Important Message (Signed)
Important Message  Patient Details  Name: Tara Serrano MRN: [id number] Date of Birth: Nov 15, 1945   Medicare Important Message Given:  Yes    [name] 11/10/2016, 1:33 PM

## 2016-11-10 NOTE — Progress Notes (Signed)
PROGRESS NOTE    Sherlyne Crownover  XIP:[id number] DOB: 1945-09-27 DOA: 11/06/2016 PCP: Patient, No Pcp Per    Brief Narrative:  71 year old female who presented with abdominal pain. Patient is known to have type 2 diabetes mellitus, hypertension and GERD. Patient reported abdominal pain for about 7 days prior to hospitalization, associated with nausea and poor appetite. On the initial physical examination blood pressure 140/76, heart rate 99, temperature 100.7, respiratory rate 37, oxygen saturation 91% on BiPAP. She was noted to be in moderate respiratory distress, lethargic, moist mucous membranes, heart S1-S2 present, irregular and tachycardic, lungs with bibasilar rales and positive use of accessory muscles, abdomen was tender to deep palpation, active bowel sounds, lower extremities with no edema. Sodium 136, potassium 2.6, chloride 95, bicarbonate 27, glucose 251, BUN 25, creatinine 1.33, lipase 28, alkaline phosphatase 399, AST 246, AST 163, total bilirubin is 2.5. Lactic acid 2.1, white count 10.0, hemoglobin 12.2, hematocrit 37.8, platelets 256. Urinalysis with 6-30 white cells, specific gravity 1.032, pH 5.0. Abdominal CT with distended gallbladder with mild biliary dilatation findings consistent with cholecystitis and possible cholangitis, hepatomegaly, incidental pulmonary nodule and pancreatic cyst. Chest film with mild right rotation, with inspissated, mildly over-penetrated, positive interstitial infiltrates at bases bilaterally with cephalization of vasculature. EKG with atrial fibrillation 72 bpm with a right bundle branch block. Abdominal ultrasound with cholelithiasis.  Patient admitted to the hospital with the working diagnosis of sepsis due to cholangitis, present on admission, complicated by hypoxic respiratory failure due to cardiogenic pulmonary edema and new onset atrial fibrillation.  Patient regained hemodynamic stability, stress test with low risk, patient underwent surgical  intervention, cholecystectomy 09/04.     Assessment & Plan:   Active Problems:   Acute cholecystitis   Atrial fibrillation (HCC)   Cholangitis   1. Sepsis due to cholangitis, present on admission, complicated with acute systolic heart failure and cardiogenic pulmonary edema. Antibiotic therapy with Zosyn IV #4. Cultures continue to be no growth, had cholecystectomy today. Will continue with PO furosemide and carvedilol. Urine output 2,900 cc over last 24 hours.   2. Hypoxic respiratory failure due to cardiogenic pulmonary edema. Oxygen requirements at 2 LPM, with oxygen saturation at 95%, continue oxymetry monitoring and supplemental 02 per Campbell to target 02 saturation more than 92%. Continue diuresis to keep negative fluid balance, incentive spirometer.   3. New onset atrial fibrillation. Rate well controlled, with HR in the 70's. Continue with telemetry monitoring. Patient is high risk for thromboembolic events with CHADsVASc score is up to 5, will need full anticoagulation when surgically safe.  3. Type 2 diabetes mellitus.  Capillary glucose 95, 79, 88, 102, 151. Will continue insulin sliding scale for glucose cover and monitoring. Patient post surgical intervention, will advance diet per surgery protocol.   4. Hypertension. Continue to hold for now on losartan and nifedipine. Blood pressure 673 to 419 systolic today.   DVT prophylaxis: enoxaparin  Code Status:Partial (no cpr) Family Communication:no family at bedside  Disposition Plan:home    Consultants:  Gastroenterology  Surgery  Cardiology    Procedures:    Antimicrobials:   Zosyn.    Subjective: Patient sp surgical procedure, somnolent due to anesthesia, and analgesia, no dyspnea or chest pain, no nausea, vomiting or abdominal pain.   Objective: Vitals:   11/10/16 1255 11/10/16 1300 11/10/16 1310 11/10/16 1315  BP: (!) 143/75  (!) 151/73   Pulse: 80 81 73 76  Resp: (!) 25 19 18  (!) 21    Temp:  TempSrc:      SpO2: 94% 94% 95% 95%  Weight:      Height:        Intake/Output Summary (Last 24 hours) at 11/10/16 1329 Last data filed at 11/10/16 1233  Gross per 24 hour  Intake             1240 ml  Output             3650 ml  Net            -2410 ml   Filed Weights   11/08/16 0500 11/09/16 0515 11/10/16 0500  Weight: 87.3 kg (192 lb 9 oz) 88.8 kg (195 lb 11.2 oz) 88 kg (194 lb)    Examination:  General: deconditioned Neurology: somnolent, non focal  E ENT: mild pallor, no icterus, oral mucosa dry Cardiovascular: No JVD. S1-S2 present, rhythmic, no gallops, rubs, or murmurs. No jugular venous distention, trace lower extremity edema. Pulmonary: vesicular breath sounds bilaterally, adequate air movement, no wheezing, rhonchi or rales. Gastrointestinal. Abdomen soft, no organomegaly, non tender, no rebound or guarding Skin. No rashes Musculoskeletal: no joint deformities     Data Reviewed: I have personally reviewed following labs and imaging studies  CBC:  Recent Labs Lab 11/06/16 0223 11/07/16 0237 11/08/16 1046 11/09/16 0359 11/10/16 0307  WBC 10.0 15.6* 11.9* 9.8 9.3  NEUTROABS  --   --   --  7.8* 6.8  HGB 12.2 10.7* 11.1* 10.6* 11.5*  HCT 37.8 33.2* 35.1* 33.9* 36.6  MCV 87.7 86.9 89.3 89.0 89.5  PLT 256 208 216 231 160   Basic Metabolic Panel:  Recent Labs Lab 11/06/16 0223 11/06/16 0331 11/07/16 0237 11/08/16 1046 11/09/16 0359 11/10/16 0307  NA 136  --  140 144 142 139  K 2.6*  --  3.1* 3.1* 3.4* 5.2*  CL 95*  --  98* 102 102 100*  CO2 27  --  29 30 37* 31  GLUCOSE 251*  --  172* 71 66 80  BUN 25*  --  27* 30* 23* 15  CREATININE 1.33*  --  1.49* 1.30* 1.13* 1.06*  CALCIUM 8.8*  --  8.1* 8.2* 8.4* 8.6*  MG  --  1.6* 2.0  --   --   --   PHOS  --   --  5.7*  --   --   --    GFR: Estimated Creatinine Clearance: 48 mL/min (A) (by C-G formula based on SCr of 1.06 mg/dL (H)). Liver Function Tests:  Recent Labs Lab  11/06/16 0223 11/07/16 0237 11/08/16 1046 11/10/16 0307  AST 246* 186* 63* 39  ALT 163* 189* 113* 60*  ALKPHOS 399* 415* 395* 367*  BILITOT 2.5* 5.6* 3.4* 3.6*  PROT 7.3 6.5 6.2* 6.0*  ALBUMIN 3.2* 2.5* 2.2* 2.2*    Recent Labs Lab 11/06/16 0223  LIPASE 28   No results for input(s): AMMONIA in the last 168 hours. Coagulation Profile:  Recent Labs Lab 11/07/16 0237  INR 1.23   Cardiac Enzymes:  Recent Labs Lab 11/06/16 1035 11/06/16 1309 11/06/16 1953  TROPONINI 0.03* 0.05* 0.03*   BNP (last 3 results) No results for input(s): PROBNP in the last 8760 hours. HbA1C: No results for input(s): HGBA1C in the last 72 hours. CBG:  Recent Labs Lab 11/09/16 1158 11/09/16 1654 11/09/16 2218 11/10/16 0815 11/10/16 1243  GLUCAP 94 95 79 88 102*   Lipid Profile: No results for input(s): CHOL, HDL, LDLCALC, TRIG, CHOLHDL, LDLDIRECT in the last 72 hours. Thyroid  Function Tests: No results for input(s): TSH, T4TOTAL, FREET4, T3FREE, THYROIDAB in the last 72 hours. Anemia Panel:  Recent Labs  11/09/16 0726  FERRITIN 161      Radiology Studies: I have reviewed all of the imaging during this hospital visit personally     Scheduled Meds: . [MAR Hold] carvedilol  25 mg Oral BID WC  . [MAR Hold] furosemide  20 mg Oral Daily  . [MAR Hold] heparin subcutaneous  5,000 Units Subcutaneous Q8H  . HYDROmorphone      . [MAR Hold] insulin aspart  0-9 Units Subcutaneous TID WC  . [MAR Hold] pantoprazole (PROTONIX) IV  40 mg Intravenous Q24H   Continuous Infusions: . [MAR Hold] piperacillin-tazobactam (ZOSYN)  IV Stopped (11/10/16 0905)     LOS: 4 days        Mauricio Gerome Apley, MD [facility] Pager [phone]

## 2016-11-10 NOTE — Progress Notes (Addendum)
1000 Report given to short stay, NPO maint. CHG bath done this morning. Pt to short stay via bed.  1340 received pt back from PACU, lethargic, responsive to verbal stimuli. V/S stable. 1700 Pt is more awake now. Had clear liquids, tolerated.

## 2016-11-10 NOTE — Progress Notes (Signed)
[facility] Progress Note  2 Days Post-Op  Subjective: CC: Acute cholecystitis Patient was not NPO status overnight, but states she has not had anything to eat/drink since 9 PM last night. Denies abdominal pain, nausea, vomiting.  UOP good, VSS.   Objective: Vital signs in last 24 hours: Temp:  [97.5 F (36.4 C)-98.2 F (36.8 C)] 97.9 F (36.6 C) (09/04 0500) Pulse Rate:  [79-86] 81 (09/04 0500) Resp:  [17-18] 18 (09/04 0500) BP: (93-144)/(63-78) 144/78 (09/04 0500) SpO2:  [95 %-97 %] 95 % (09/04 0500) Weight:  [88 kg (194 lb)] 88 kg (194 lb) (09/04 0500) Last BM Date: 11/09/16  Intake/Output from previous day: 09/03 0701 - 09/04 0700 In: 340 [P.O.:340] Out: 2900 [Urine:2900] Intake/Output this shift: No intake/output data recorded.  PE: Gen:  Alert, NAD, pleasant Card:  Regular rate and rhythm, pedal pulses 2+ BL Pulm:  Normal effort, clear to auscultation bilaterally Abd: Soft, non-tender, non-distended, bowel sounds present in all 4 quadrants, no HSM Skin: warm and dry, no rashes  Psych: A&Ox3   Lab Results:   Recent Labs  11/09/16 0359 11/10/16 0307  WBC 9.8 9.3  HGB 10.6* 11.5*  HCT 33.9* 36.6  PLT 231 264   BMET  Recent Labs  11/09/16 0359 11/10/16 0307  NA 142 139  K 3.4* 5.2*  CL 102 100*  CO2 37* 31  GLUCOSE 66 80  BUN 23* 15  CREATININE 1.13* 1.06*  CALCIUM 8.4* 8.6*   PT/INR No results for input(s): LABPROT, INR in the last 72 hours. CMP     Component Value Date/Time   NA 139 11/10/2016 0307   K 5.2 (H) 11/10/2016 0307   CL 100 (L) 11/10/2016 0307   CO2 31 11/10/2016 0307   GLUCOSE 80 11/10/2016 0307   BUN 15 11/10/2016 0307   CREATININE 1.06 (H) 11/10/2016 0307   CALCIUM 8.6 (L) 11/10/2016 0307   PROT 6.0 (L) 11/10/2016 0307   ALBUMIN 2.2 (L) 11/10/2016 0307   AST 39 11/10/2016 0307   ALT 60 (H) 11/10/2016 0307   ALKPHOS 367 (H) 11/10/2016 0307   BILITOT 3.6 (H) 11/10/2016 0307   GFRNONAA 52 (L) 11/10/2016 0307    GFRAA 60 (L) 11/10/2016 0307   Lipase     Component Value Date/Time   LIPASE 28 11/06/2016 0223       Studies/Results: Dg Chest 2 View  Result Date: 11/09/2016 CLINICAL DATA:  Pleuritic pain EXAM: CHEST  2 VIEW COMPARISON:  Chest CT 11/06/2016 FINDINGS: Stable enlarged cardiac silhouette. No effusion, infiltrate pneumothorax. Small effusions and low lung volumes. Remote LEFT rib fracture. IMPRESSION: Basilar atelectasis and small effusions. Electronically Signed   By: Suzy Bouchard M.D.   On: 11/09/2016 08:18   Nm Myocar Multi W/spect W/wall Motion / Ef  Result Date: 11/09/2016  There was no ST segment deviation noted during stress.  No T wave inversion was noted during stress.  Defect 1: There is a small defect of mild severity present in the apical septal location.  Findings consistent with ischemia.  This is a low risk study.  The left ventricular ejection fraction is mildly decreased (45-54%).  Nuclear stress EF: 50%.  Low risk stress nuclear study with a small area of apicoseptal ischemia and borderline left ventricular systolic function.    Anti-infectives: Anti-infectives    Start     Dose/Rate Route Frequency Ordered Stop   11/06/16 0630  piperacillin-tazobactam (ZOSYN) IVPB 3.375 g     3.375 g 12.5 mL/hr over  240 Minutes Intravenous Every 8 hours 11/06/16 0620         Assessment/Plan Cholecystitis with SIRS/sepsis - sepsis physiology resolved - Tbili 3.6, AST/ALT normal, Alk phos 367 - MRCP done 9 - negative for choledocholithiasis, findings suggestive of acute calculous cholecystitis - WBC 9.3, afebrile - continue IV Zosyn - IR drain not placed - to OR for lap chole later today   AFib with RVR with acute resp failure - off BIPAP - new onset AFib, likely related to infection  - stress test: Low risk stress nuclear study with a small area of apicoseptal ischemia and borderline left ventricular systolic function AKI - Cr 1.09 RLL lung nodule on CT -  incidental finding, will need OP f/u  FEN - NPO, IVF VTE - SCDs, hold heparin ID - IV Zosyn (8/31>>), blood cxs with no growth  Plan: OR later today for lap chole  LOS: 4 days    Brigid Re , [facility] 11/10/2016, 8:04 AM Pager: [phone] Consults: [phone] Mon-Fri 7:00 am-4:30 pm Sat-Sun 7:00 am-11:30 am

## 2016-11-11 ENCOUNTER — Encounter (HOSPITAL_COMMUNITY): Payer: Self-pay | Admitting: General Surgery

## 2016-11-11 LAB — HEPATIC FUNCTION PANEL
ALBUMIN: 2.3 g/dL — AB (ref 3.5–5.0)
ALK PHOS: 323 U/L — AB (ref 38–126)
ALT: 52 U/L (ref 14–54)
AST: 48 U/L — ABNORMAL HIGH (ref 15–41)
Bilirubin, Direct: 0.5 mg/dL (ref 0.1–0.5)
Indirect Bilirubin: 0.6 mg/dL (ref 0.3–0.9)
TOTAL PROTEIN: 6.6 g/dL (ref 6.5–8.1)
Total Bilirubin: 1.1 mg/dL (ref 0.3–1.2)

## 2016-11-11 LAB — CBC WITH DIFFERENTIAL/PLATELET
BASOS ABS: 0 10*3/uL (ref 0.0–0.1)
BASOS PCT: 0 %
EOS ABS: 0 10*3/uL (ref 0.0–0.7)
EOS PCT: 0 %
HEMATOCRIT: 38.6 % (ref 36.0–46.0)
Hemoglobin: 11.9 g/dL — ABNORMAL LOW (ref 12.0–15.0)
LYMPHS ABS: 1.1 10*3/uL (ref 0.7–4.0)
LYMPHS PCT: 10 %
MCH: 27.9 pg (ref 26.0–34.0)
MCHC: 30.8 g/dL (ref 30.0–36.0)
MCV: 90.6 fL (ref 78.0–100.0)
MONO ABS: 0.7 10*3/uL (ref 0.1–1.0)
MONOS PCT: 7 %
Neutro Abs: 9.2 10*3/uL — ABNORMAL HIGH (ref 1.7–7.7)
Neutrophils Relative %: 83 %
PLATELETS: 336 10*3/uL (ref 150–400)
RBC: 4.26 MIL/uL (ref 3.87–5.11)
RDW: 14.4 % (ref 11.5–15.5)
WBC: 11 10*3/uL — ABNORMAL HIGH (ref 4.0–10.5)

## 2016-11-11 LAB — BASIC METABOLIC PANEL
ANION GAP: 9 (ref 5–15)
BUN: 17 mg/dL (ref 6–20)
CALCIUM: 8.3 mg/dL — AB (ref 8.9–10.3)
CO2: 30 mmol/L (ref 22–32)
CREATININE: 1.09 mg/dL — AB (ref 0.44–1.00)
Chloride: 98 mmol/L — ABNORMAL LOW (ref 101–111)
GFR, EST AFRICAN AMERICAN: 58 mL/min — AB (ref >60)
GFR, EST NON AFRICAN AMERICAN: 50 mL/min — AB (ref >60)
Glucose, Bld: 154 mg/dL — ABNORMAL HIGH (ref 65–99)
Potassium: 4.2 mmol/L (ref 3.5–5.1)
SODIUM: 137 mmol/L (ref 135–145)

## 2016-11-11 LAB — CULTURE, BLOOD (ROUTINE X 2)
CULTURE: NO GROWTH
Culture: NO GROWTH
SPECIAL REQUESTS: ADEQUATE
Special Requests: ADEQUATE

## 2016-11-11 LAB — GLUCOSE, CAPILLARY
GLUCOSE-CAPILLARY: 167 mg/dL — AB (ref 65–99)
GLUCOSE-CAPILLARY: 188 mg/dL — AB (ref 65–99)
GLUCOSE-CAPILLARY: 143 mg/dL — AB (ref 65–99)
GLUCOSE-CAPILLARY: 146 mg/dL — AB (ref 65–99)

## 2016-11-11 MED ORDER — AMOXICILLIN-POT CLAVULANATE 875-125 MG PO TABS
1.0000 | ORAL_TABLET | Freq: Two times a day (BID) | ORAL | Status: DC
  Administered 2016-11-11 – 2016-11-12 (×2): 1 via ORAL
  Filled 2016-11-11 (×2): qty 1

## 2016-11-11 NOTE — Anesthesia Postprocedure Evaluation (Signed)
Anesthesia Post Note  Patient: Salia Cangemi  Procedure(s) Performed: Procedure(s) (LRB): LAPAROSCOPIC CHOLECYSTECTOMY WITH INTRAOPERATIVE CHOLANGIOGRAM (N/A)     Patient location during evaluation: PACU Anesthesia Type: General Level of consciousness: awake and alert Pain management: pain level controlled Vital Signs Assessment: post-procedure vital signs reviewed and stable Respiratory status: spontaneous breathing, nonlabored ventilation, respiratory function stable and patient connected to nasal cannula oxygen Cardiovascular status: blood pressure returned to baseline and stable Postop Assessment: no signs of nausea or vomiting Anesthetic complications: no    Last Vitals:  Vitals:   11/10/16 2143 11/11/16 0519  BP: 136/65 140/78  Pulse: 73 77  Resp: 19 19  Temp: 36.4 C 36.6 C  SpO2: 98% 98%    Last Pain:  Vitals:   11/11/16 0810  TempSrc:   PainSc: 0-No pain                 Lai Hendriks,W. EDMOND

## 2016-11-11 NOTE — Care Management (Addendum)
Benefit check :  Pt copay will be $403.33. Pt has a $405 deductible that has not been met. After that has been met , pt will go into the initial coverage stage and the copay will be $19.50                     Eliquis ( apixaban) 5 mg PO BID X 30 days        Xarelto priced the same.   30 day free card for Eliquis given and explained to patient.  All of above explained to patient. Patient voiced understanding. Patient stated she can pay the $403.33  Magdalen Spatz RN BSN [phone]

## 2016-11-11 NOTE — Progress Notes (Signed)
PT paged as requested by MD but no return call received

## 2016-11-11 NOTE — Progress Notes (Signed)
[facility] Progress Note  1 Day Post-Op  Subjective: CC: abdominal pain Patient reports abdomen feels sore, but pain well controlled. Tolerating regular diet. Denies nausea or vomiting. Passing flatus. Still has foley.  VSS.   Objective: Vital signs in last 24 hours: Temp:  [97 F (36.1 C)-97.8 F (36.6 C)] 97.8 F (36.6 C) (09/05 0519) Pulse Rate:  [73-81] 77 (09/05 0519) Resp:  [16-27] 19 (09/05 0519) BP: (131-151)/(57-82) 140/78 (09/05 0519) SpO2:  [89 %-98 %] 98 % (09/05 0519) Weight:  [87.8 kg (193 lb 9.6 oz)] 87.8 kg (193 lb 9.6 oz) (09/05 0500) Last BM Date: 11/10/16  Intake/Output from previous day: 09/04 0701 - 09/05 0700 In: 1829 [P.O.:240; I.V.:900; IV Piggyback:150] Out: 750 [Urine:710; Blood:40] Intake/Output this shift: No intake/output data recorded.  PE: Gen:  Alert, NAD, pleasant Card:  Regular rate and rhythm, pedal pulses 2+ BL Pulm:  Normal effort, clear to auscultation bilaterally Abd: Soft, non-tender, non-distended, bowel sounds present in all 4 quadrants, no HSM, incisions C/D/I Skin: warm and dry, no rashes  Psych: A&Ox3   Lab Results:   Recent Labs  11/10/16 0307 11/11/16 0423  WBC 9.3 11.0*  HGB 11.5* 11.9*  HCT 36.6 38.6  PLT 264 336   BMET  Recent Labs  11/10/16 0307 11/11/16 0423  NA 139 137  K 5.2* 4.2  CL 100* 98*  CO2 31 30  GLUCOSE 80 154*  BUN 15 17  CREATININE 1.06* 1.09*  CALCIUM 8.6* 8.3*   PT/INR No results for input(s): LABPROT, INR in the last 72 hours. CMP     Component Value Date/Time   NA 137 11/11/2016 0423   K 4.2 11/11/2016 0423   CL 98 (L) 11/11/2016 0423   CO2 30 11/11/2016 0423   GLUCOSE 154 (H) 11/11/2016 0423   BUN 17 11/11/2016 0423   CREATININE 1.09 (H) 11/11/2016 0423   CALCIUM 8.3 (L) 11/11/2016 0423   PROT 6.6 11/11/2016 0423   ALBUMIN 2.3 (L) 11/11/2016 0423   AST 48 (H) 11/11/2016 0423   ALT 52 11/11/2016 0423   ALKPHOS 323 (H) 11/11/2016 0423   BILITOT 1.1  11/11/2016 0423   GFRNONAA 50 (L) 11/11/2016 0423   GFRAA 58 (L) 11/11/2016 0423   Lipase     Component Value Date/Time   LIPASE 28 11/06/2016 0223       Studies/Results: Dg Cholangiogram Operative  Result Date: 11/10/2016 CLINICAL DATA:  Intraoperative cholangiogram during laparoscopic cholecystectomy. EXAM: INTRAOPERATIVE CHOLANGIOGRAM FLUOROSCOPY TIME:  1 minute, 38 seconds COMPARISON:  MRCP - 11/07/2016 FINDINGS: Intraoperative cholangiographic images of the right upper abdominal quadrant during laparoscopic cholecystectomy are provided for review. Surgical clips overlie the expected location of the gallbladder fossa. Initial attempted cholangiogram demonstrates extravasation of contrast about the gallbladder fossa. Subsequent contrast injection demonstrates opacification of the cystic duct with minimal amount of additional extravasation. There is passage of contrast through the central aspect of the cystic duct with filling of a non dilated common bile duct. There is passage of contrast though the CBD and into the descending portion of the duodenum. There is minimal reflux of injected contrast into the common hepatic duct and central aspect of the non dilated intrahepatic biliary system. Apparent filling defect within the mid aspect the common bile duct is favored to represent the right L2 pedicle and not a discrete intraluminal filling defect. No definitive persistent filling defects within the opacified portion of the biliary tree to suggest choledocholithiasis. IMPRESSION: Degraded examination without definitive evidence of choledocholithiasis.  Electronically Signed   By: Sandi Mariscal M.D.   On: 11/10/2016 12:49   Nm Myocar Multi W/spect W/wall Motion / Ef  Result Date: 11/09/2016  There was no ST segment deviation noted during stress.  No T wave inversion was noted during stress.  Defect 1: There is a small defect of mild severity present in the apical septal location.  Findings  consistent with ischemia.  This is a low risk study.  The left ventricular ejection fraction is mildly decreased (45-54%).  Nuclear stress EF: 50%.  Low risk stress nuclear study with a small area of apicoseptal ischemia and borderline left ventricular systolic function.    Anti-infectives: Anti-infectives    Start     Dose/Rate Route Frequency Ordered Stop   11/06/16 0630  piperacillin-tazobactam (ZOSYN) IVPB 3.375 g     3.375 g 12.5 mL/hr over 240 Minutes Intravenous Every 8 hours 11/06/16 0620         Assessment/Plan Cholecystitis with SIRS/sepsis - sepsis physiology resolved S/p lap cholecystectomy with cholangiogram 9/4 Dr. Barry Dienes - POD#1 - MRCP done 9 - negative for choledocholithiasis, findings suggestive of acute calculous cholecystitis - no filling defect on cholangiogram - WBC 11, afebrile - can discontinue IV Zosyn - LFTs trending down today - tolerating diet - discontinue foley  AFib with RVR with acute resp failure - off BIPAP - new onset AFib, likely related to infection  - stress test: Low risk stress nuclear study with a small area of apicoseptal ischemia and borderline left ventricular systolic function AKI - Cr 1.09 RLL lung nodule on CT - incidental finding, will need OP f/u  FEN - NPO, IVF VTE - SCDs, ok to resume anticoagulation ID - IV Zosyn (8/31>>), blood cxs with no growth  Plan: wean off O2 as able. Discontinue foley. Patient may be ready to be discharged home later today or tomorrow.   LOS: 5 days    Brigid Re , [facility] 11/11/2016, 9:02 AM Pager: [phone] Consults: [phone] Mon-Fri 7:00 am-4:30 pm Sat-Sun 7:00 am-11:30 am

## 2016-11-11 NOTE — Progress Notes (Signed)
PROGRESS NOTE    Tara Serrano  GBT:[id number] DOB: 1945/04/30 DOA: 11/06/2016 PCP: Patient, No Pcp Per (Confirm with patient/family/NH records and if not entered, this HAS to be entered at [facility] point of entry. "No PCP" if truly none.)   Brief Narrative: (Start on day 1 of progress note - keep it brief and live) 71 year old female who presented with abdominal pain. Patient is known to have type 2 diabetes mellitus, hypertension and GERD. Patient reported abdominal pain for about 7 days prior to hospitalization, associated with nausea and poor appetite. On the initial physical examination blood pressure 140/76, heart rate 99, temperature 100.7, respiratory rate 37, oxygen saturation 91% on BiPAP. She was noted to be in moderate respiratory distress, lethargic, moist mucous membranes, heart S1-S2 present, irregular and tachycardic, lungs with bibasilar rales and positive use of accessory muscles, abdomen was tender to deep palpation, active bowel sounds, lower extremities with no edema. Sodium 136, potassium 2.6, chloride 95, bicarbonate 27, glucose 251, BUN 25, creatinine 1.33, lipase 28, alkaline phosphatase 399, AST 246, AST 163, total bilirubin is 2.5. Lactic acid 2.1, white count 10.0, hemoglobin 12.2, hematocrit 37.8, platelets 256. Urinalysis with 6-30 white cells, specific gravity 1.032, pH 5.0. Abdominal CT with distended gallbladder with mild biliary dilatation findings consistent with cholecystitis and possible cholangitis, hepatomegaly, incidental pulmonary nodule and pancreatic cyst. Chest film with mild right rotation, with inspissated, mildly over-penetrated, positive interstitial infiltrates at bases bilaterally with cephalization of vasculature. EKG with atrial fibrillation 72 bpm with Dasie Chancellor right bundle branch block. Abdominal ultrasound with cholelithiasis.  Patient admitted to the hospital with the working diagnosis of sepsis due to cholangitis, present on admission, complicated by hypoxic  respiratory failure due to cardiogenic pulmonary edema and new onset atrial fibrillation.  Patient regained hemodynamic stability, stress test with low risk, patient underwent surgical intervention, cholecystectomy 09/04.   Assessment & Plan:   Active Problems:   Acute cholecystitis   Atrial fibrillation (HCC)   Cholangitis   1. Sepsis due to cholangitis/Cholecystitis, present on admission, complicated with acute systolic heart failure and cardiogenic pulmonary edema (see below). Antibiotic therapy with Zosyn IV (8/31 to present).  Given source control, will consider narrowing.  Cultures continue to be no growth, had cholecystectomy 9/4.  Improving elevated alk phos and liver enzymes and bilirubin.   2. HFrEF (EF 45-50%)  Hypoxic respiratory failure due to cardiogenic pulmonary edema. Oxygen requirements at 3 LPM, with oxygen saturation at 98%, discussed weaning as tolerated with nurse. continue oxymetry monitoring and supplemental 02 per [place] to target 02 saturation more than 92%. Continue diuresis to keep negative fluid balance, incentive spirometer.  Continue PO lasix and carvedilol.   3. New onset atrial fibrillation. Rate well controlled, with HR in the 70's. Continue with telemetry monitoring. Patient is high risk for thromboembolic events with CHADsVASc score is up to 5, will need full anticoagulation when surgically safe.  3. Type 2 diabetes mellitus. Capillary glucose 95, 79, 88, 102, 151. Will continue insulin sliding scale for glucose cover and monitoring. Patient post surgical intervention, will advance diet per surgery protocol.   4. Hypertension. Continue to hold for now on losartan and nifedipine. Blood pressure 710 to 626 systolic today.   5. AKI:  Baseline unknown, creatinine peaked to 1.49, currently improved.  CTM.    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: f/u  with reimaging at 2 years per radiology Retroperitoneal LAD: f/u CT abd/pelvis with oral IV contrast in 3 months Expansile 3.7 cm anterior L 5th rib lesion - indeterminant, consider met vs plasmacytoma (rec outpt eval with PET-CT and/or bone biopsy) Subcentimeter T1 hyperintense renal cortical lesions (repeat MRI abdomen with and without contrast in 6-12 months) L adrenal adenoma  DVT prophylaxis: heparin Code Status: partial (no CPR) Family Communication: husband at bedside Disposition Plan: home, PT/OT to see   Consultants:   Cardiology, surgery, gastroenterology  Procedures: (Don't include imaging studies which can be auto populated. Include things that cannot be auto populated i.e. Echo, Carotid and venous dopplers, Foley, Bipap, HD, tubes/drains, wound vac, central lines etc)  Laparoscopic cholecystectomy with IOC  Echo, EF 45-50%, hypokinesis, unable to eval diastolic function.  Trivial MR, mod biatrial enlargement, dilated RV with mild RV systolic dysfunction, trivial TR, RVSP 36 mmHG, dilated IVC, trivial posterior pericardial effusion  Antimicrobials: (specify start and planned stop date. Auto populated tables are space occupying and do not give end dates)  zosyn    Subjective: No CP.  SOB is improved.  Abd pain is improved.  No BM yet, but passing gas.   Objective: Vitals:   11/10/16 1844 11/10/16 2143 11/11/16 0500 11/11/16 0519  BP: 132/82 136/65  140/78  Pulse: 75 73  77  Resp: _0 Temp: 97.6 F (36.4 C) 97.6 F (36.4 C)  97.8 F (36.6 C)  TempSrc: Axillary Oral  Oral  SpO2: 97% 98%  98%  Weight:   87.8 kg (193 lb 9.6 oz)   Height:        Intake/Output Summary (Last 24 hours) at 11/11/16 1050 Last data filed at 11/11/16 0517  Gross per 24 hour  Intake             1290 ml  Output              750 ml  Net              540 ml   Filed Weights   11/09/16 0515 11/10/16 0500 11/11/16 0500  Weight: 88.8 kg (195 lb 11.2 oz) 88 kg (194  lb) 87.8 kg (193 lb 9.6 oz)    Examination:  General exam: Appears calm and comfortable  Respiratory system: bibasilar crackles.  Cardiovascular system: S1 & S2 heard, RRR. No JVD, murmurs, rubs, gallops or clicks.  Gastrointestinal system: Abdomen is nondistended, soft and mildly tender.  Laproscopic incisions healing well. No organomegaly or masses felt. Normal bowel sounds heard. Central nervous system: Alert and oriented. No focal neurological deficits. Extremities: bilateral 1+ LEE, palpable pedal pulses Skin: No rashes, lesions or ulcers Psychiatry: Judgement and insight appear normal. Mood & affect appropriate.     Data Reviewed: I have personally reviewed following labs and imaging studies  CBC:  Recent Labs Lab 11/07/16 0237 11/08/16 1046 11/09/16 0359 11/10/16 0307 11/11/16 0423  WBC 15.6* 11.9* 9.8 9.3 11.0*  NEUTROABS  --   --  7.8* 6.8 9.2*  HGB 10.7* 11.1* 10.6* 11.5* 11.9*  HCT 33.2* 35.1* 33.9* 36.6 38.6  MCV 86.9 89.3 89.0 89.5 90.6  PLT 208 216 231 264 097   Basic Metabolic Panel:  Recent Labs Lab 11/06/16 0331 11/07/16 0237 11/08/16 1046 11/09/16 0359 11/10/16 0307 11/11/16 0423  NA  --  140 144 142 139 137  K  --  3.1* 3.1* 3.4* 5.2* 4.2  CL  --  98* 102 102 100* 98*  CO2  --  29 30 37* 31 30  GLUCOSE  --  172* 71 66 80 154*  BUN  --  27* 30* 23* 15 17  CREATININE  --  1.49* 1.30* 1.13* 1.06* 1.09*  CALCIUM  --  8.1* 8.2* 8.4* 8.6* 8.3*  MG 1.6* 2.0  --   --   --   --   PHOS  --  5.7*  --   --   --   --    GFR: Estimated Creatinine Clearance: 46.6 mL/min (Iraida Cragin) (by C-G formula based on SCr of 1.09 mg/dL (H)). Liver Function Tests:  Recent Labs Lab 11/06/16 0223 11/07/16 0237 11/08/16 1046 11/10/16 0307 11/11/16 0423  AST 246* 186* 63* 39 48*  ALT 163* 189* 113* 60* 52  ALKPHOS 399* 415* 395* 367* 323*  BILITOT 2.5* 5.6* 3.4* 3.6* 1.1  PROT 7.3 6.5 6.2* 6.0* 6.6  ALBUMIN 3.2* 2.5* 2.2* 2.2* 2.3*    Recent Labs Lab  11/06/16 0223  LIPASE 28   No results for input(s): AMMONIA in the last 168 hours. Coagulation Profile:  Recent Labs Lab 11/07/16 0237  INR 1.23   Cardiac Enzymes:  Recent Labs Lab 11/06/16 1035 11/06/16 1309 11/06/16 1953  TROPONINI 0.03* 0.05* 0.03*   BNP (last 3 results) No results for input(s): PROBNP in the last 8760 hours. HbA1C: No results for input(s): HGBA1C in the last 72 hours. CBG:  Recent Labs Lab 11/10/16 1243 11/10/16 1631 11/10/16 1819 11/10/16 2159 11/11/16 0759  GLUCAP 102* 151* 163* 261* 167*   Lipid Profile: No results for input(s): CHOL, HDL, LDLCALC, TRIG, CHOLHDL, LDLDIRECT in the last 72 hours. Thyroid Function Tests: No results for input(s): TSH, T4TOTAL, FREET4, T3FREE, THYROIDAB in the last 72 hours. Anemia Panel:  Recent Labs  11/09/16 0726  FERRITIN 161   Sepsis Labs:  Recent Labs Lab 11/06/16 0423 11/06/16 0702 11/06/16 1035 11/06/16 1309 11/07/16 0237 11/08/16 0302  PROCALCITON  --   --  3.59  --  6.98 4.74  LATICACIDVEN 2.10* 4.09* 2.7* 2.5*  --   --     Recent Results (from the past 240 hour(s))  Blood culture (routine x 2)     Status: None   Collection Time: 11/06/16  4:00 AM  Result Value Ref Range Status   Specimen Description BLOOD RIGHT ANTECUBITAL  Final   Special Requests   Final    BOTTLES DRAWN AEROBIC AND ANAEROBIC Blood Culture adequate volume   Culture NO GROWTH 5 DAYS  Final   Report Status 11/11/2016 FINAL  Final  Blood culture (routine x 2)     Status: None   Collection Time: 11/06/16  4:10 AM  Result Value Ref Range Status   Specimen Description BLOOD LEFT ARM  Final   Special Requests IN PEDIATRIC BOTTLE Blood Culture adequate volume  Final   Culture NO GROWTH 5 DAYS  Final   Report Status 11/11/2016 FINAL  Final  MRSA PCR Screening     Status: None   Collection Time: 11/06/16  9:21 AM  Result Value Ref Range Status   MRSA by PCR NEGATIVE NEGATIVE Final    Comment:        The GeneXpert  MRSA Assay (FDA approved for NASAL specimens only), is one component of Mickenzie Stolar comprehensive MRSA colonization surveillance program. It is not intended to diagnose MRSA infection nor to guide or monitor treatment for MRSA infections.          Radiology Studies: Dg Cholangiogram Operative  Result  Date: 11/10/2016 CLINICAL DATA:  Intraoperative cholangiogram during laparoscopic cholecystectomy. EXAM: INTRAOPERATIVE CHOLANGIOGRAM FLUOROSCOPY TIME:  1 minute, 38 seconds COMPARISON:  MRCP - 11/07/2016 FINDINGS: Intraoperative cholangiographic images of the right upper abdominal quadrant during laparoscopic cholecystectomy are provided for review. Surgical clips overlie the expected location of the gallbladder fossa. Initial attempted cholangiogram demonstrates extravasation of contrast about the gallbladder fossa. Subsequent contrast injection demonstrates opacification of the cystic duct with minimal amount of additional extravasation. There is passage of contrast through the central aspect of the cystic duct with filling of Fidel Caggiano non dilated common bile duct. There is passage of contrast though the CBD and into the descending portion of the duodenum. There is minimal reflux of injected contrast into the common hepatic duct and central aspect of the non dilated intrahepatic biliary system. Apparent filling defect within the mid aspect the common bile duct is favored to represent the right L2 pedicle and not Taylynn Easton discrete intraluminal filling defect. No definitive persistent filling defects within the opacified portion of the biliary tree to suggest choledocholithiasis. IMPRESSION: Degraded examination without definitive evidence of choledocholithiasis. Electronically Signed   By: Sandi Mariscal M.D.   On: 11/10/2016 12:49   Nm Myocar Multi W/spect W/wall Motion / Ef  Result Date: 11/09/2016  There was no ST segment deviation noted during stress.  No T wave inversion was noted during stress.  Defect 1: There is  Anajulia Leyendecker small defect of mild severity present in the apical septal location.  Findings consistent with ischemia.  This is Nika Yazzie low risk study.  The left ventricular ejection fraction is mildly decreased (45-54%).  Nuclear stress EF: 50%.  Low risk stress nuclear study with Brithany Whitworth small area of apicoseptal ischemia and borderline left ventricular systolic function.        Scheduled Meds: . carvedilol  25 mg Oral BID WC  . furosemide  20 mg Oral Daily  . heparin subcutaneous  5,000 Units Subcutaneous Q8H  . insulin aspart  0-9 Units Subcutaneous TID WC  . pantoprazole (PROTONIX) IV  40 mg Intravenous Q24H   Continuous Infusions: . piperacillin-tazobactam (ZOSYN)  IV Stopped (11/11/16 0915)     LOS: 5 days    Time spent: [place], MD [facility] [phone]   If 7PM-7AM, please contact night-coverage [email or web address] Password [email or web address] 11/11/2016, 10:50 AM

## 2016-11-12 DIAGNOSIS — R7989 Other specified abnormal findings of blood chemistry: Secondary | ICD-10-CM

## 2016-11-12 DIAGNOSIS — R945 Abnormal results of liver function studies: Secondary | ICD-10-CM

## 2016-11-12 DIAGNOSIS — M899 Disorder of bone, unspecified: Secondary | ICD-10-CM

## 2016-11-12 DIAGNOSIS — R911 Solitary pulmonary nodule: Secondary | ICD-10-CM

## 2016-11-12 DIAGNOSIS — K819 Cholecystitis, unspecified: Secondary | ICD-10-CM

## 2016-11-12 LAB — COMPREHENSIVE METABOLIC PANEL
ALK PHOS: 266 U/L — AB (ref 38–126)
ALT: 42 U/L (ref 14–54)
ANION GAP: 8 (ref 5–15)
AST: 36 U/L (ref 15–41)
Albumin: 2.3 g/dL — ABNORMAL LOW (ref 3.5–5.0)
BILIRUBIN TOTAL: 1.2 mg/dL (ref 0.3–1.2)
BUN: 18 mg/dL (ref 6–20)
CALCIUM: 8.4 mg/dL — AB (ref 8.9–10.3)
CO2: 30 mmol/L (ref 22–32)
Chloride: 101 mmol/L (ref 101–111)
Creatinine, Ser: 0.98 mg/dL (ref 0.44–1.00)
GFR calc Af Amer: >60 mL/min (ref >60)
GFR, EST NON AFRICAN AMERICAN: 57 mL/min — AB (ref >60)
GLUCOSE: 108 mg/dL — AB (ref 65–99)
Potassium: 3.4 mmol/L — ABNORMAL LOW (ref 3.5–5.1)
Sodium: 139 mmol/L (ref 135–145)
TOTAL PROTEIN: 6.1 g/dL — AB (ref 6.5–8.1)

## 2016-11-12 LAB — GLUCOSE, CAPILLARY
GLUCOSE-CAPILLARY: 106 mg/dL — AB (ref 65–99)
GLUCOSE-CAPILLARY: 125 mg/dL — AB (ref 65–99)

## 2016-11-12 LAB — CBC
HEMATOCRIT: 38.2 % (ref 36.0–46.0)
HEMOGLOBIN: 11.6 g/dL — AB (ref 12.0–15.0)
MCH: 27.8 pg (ref 26.0–34.0)
MCHC: 30.4 g/dL (ref 30.0–36.0)
MCV: 91.6 fL (ref 78.0–100.0)
Platelets: 341 10*3/uL (ref 150–400)
RBC: 4.17 MIL/uL (ref 3.87–5.11)
RDW: 15 % (ref 11.5–15.5)
WBC: 11.6 10*3/uL — ABNORMAL HIGH (ref 4.0–10.5)

## 2016-11-12 MED ORDER — POTASSIUM CHLORIDE ER 10 MEQ PO TBCR: 20.0000 meq | EXTENDED_RELEASE_TABLET | Freq: Every day | ORAL | 0 refills | Status: DC

## 2016-11-12 MED ORDER — PANTOPRAZOLE SODIUM 40 MG PO TBEC: 40.0000 mg | DELAYED_RELEASE_TABLET | Freq: Every day | ORAL | Status: DC

## 2016-11-12 MED ORDER — APIXABAN 5 MG PO TABS
5.0000 mg | ORAL_TABLET | Freq: Two times a day (BID) | ORAL | Status: DC
  Administered 2016-11-12: 5 mg via ORAL
  Filled 2016-11-12: qty 1

## 2016-11-12 MED ORDER — LISINOPRIL 2.5 MG PO TABS: 2.5000 mg | ORAL_TABLET | Freq: Every day | ORAL | 0 refills | Status: DC

## 2016-11-12 MED ORDER — LISINOPRIL 5 MG PO TABS: 2.5000 mg | ORAL_TABLET | Freq: Every day | ORAL | Status: DC

## 2016-11-12 MED ORDER — FUROSEMIDE 20 MG PO TABS
20.0000 mg | ORAL_TABLET | Freq: Every day | ORAL | 0 refills | Status: DC
Start: 2016-11-12 — End: 2016-11-25

## 2016-11-12 MED ORDER — APIXABAN 5 MG PO TABS: 5.0000 mg | ORAL_TABLET | Freq: Two times a day (BID) | ORAL | 0 refills | Status: DC

## 2016-11-12 MED ORDER — POTASSIUM CHLORIDE CRYS ER 20 MEQ PO TBCR
40.0000 meq | EXTENDED_RELEASE_TABLET | Freq: Once | ORAL | Status: AC
  Administered 2016-11-12: 40 meq via ORAL
  Filled 2016-11-12: qty 2

## 2016-11-12 MED ORDER — AMOXICILLIN-POT CLAVULANATE 875-125 MG PO TABS: 1.0000 | ORAL_TABLET | Freq: Two times a day (BID) | ORAL | 0 refills | Status: AC

## 2016-11-12 NOTE — Progress Notes (Signed)
OT Cancellation Note  Patient Details Name: Tara Serrano MRN: [id number] DOB: December 27, 1945   Cancelled Treatment:    Reason Eval/Treat Not Completed: OT screened, no needs identified, will sign off. Pt is functioning independently.  Malka So 11/12/2016, 11:27 AM  [phone]

## 2016-11-12 NOTE — Progress Notes (Signed)
[facility] Progress Note  2 Days Post-Op  Subjective: CC: wants to go home Patient tolerating diet, passing flatus and feels like she needs to have BM. Abdomen sore but pain well controlled. Off O2, denies SOB, lightheadedness. Getting up to bathroom ok. UOP good. VSS.   Objective: Vital signs in last 24 hours: Temp:  [97.7 F (36.5 C)-98.6 F (37 C)] 98.6 F (37 C) (09/06 0749) Pulse Rate:  [73-87] 73 (09/06 0749) Resp:  [17-19] 18 (09/06 0749) BP: (131-158)/(69-80) 158/80 (09/06 0749) SpO2:  [91 %-97 %] 91 % (09/06 0749) Last BM Date: 11/11/16  Intake/Output from previous day: 09/05 0701 - 09/06 0700 In: 1060 [P.O.:1060] Out: 1400 [Urine:1400] Intake/Output this shift: No intake/output data recorded.  PE: Gen:  Alert, NAD, pleasant Card:  Regular rate and rhythm, pedal pulses 2+ BL Pulm:  Normal effort, clear to auscultation bilaterally Abd: Soft, non-tender, non-distended, bowel sounds present in all 4 quadrants, no HSM, incisions C/D/I Skin: warm and dry, no rashes  Psych: A&Ox3   Lab Results:   Recent Labs  11/11/16 0423 11/12/16 0522  WBC 11.0* 11.6*  HGB 11.9* 11.6*  HCT 38.6 38.2  PLT 336 341   BMET  Recent Labs  11/11/16 0423 11/12/16 0522  NA 137 139  K 4.2 3.4*  CL 98* 101  CO2 30 30  GLUCOSE 154* 108*  BUN 17 18  CREATININE 1.09* 0.98  CALCIUM 8.3* 8.4*   PT/INR No results for input(s): LABPROT, INR in the last 72 hours. CMP     Component Value Date/Time   NA 139 11/12/2016 0522   K 3.4 (L) 11/12/2016 0522   CL 101 11/12/2016 0522   CO2 30 11/12/2016 0522   GLUCOSE 108 (H) 11/12/2016 0522   BUN 18 11/12/2016 0522   CREATININE 0.98 11/12/2016 0522   CALCIUM 8.4 (L) 11/12/2016 0522   PROT 6.1 (L) 11/12/2016 0522   ALBUMIN 2.3 (L) 11/12/2016 0522   AST 36 11/12/2016 0522   ALT 42 11/12/2016 0522   ALKPHOS 266 (H) 11/12/2016 0522   BILITOT 1.2 11/12/2016 0522   GFRNONAA 57 (L) 11/12/2016 0522   GFRAA >60  11/12/2016 0522   Lipase     Component Value Date/Time   LIPASE 28 11/06/2016 0223       Studies/Results: Dg Cholangiogram Operative  Result Date: 11/10/2016 CLINICAL DATA:  Intraoperative cholangiogram during laparoscopic cholecystectomy. EXAM: INTRAOPERATIVE CHOLANGIOGRAM FLUOROSCOPY TIME:  1 minute, 38 seconds COMPARISON:  MRCP - 11/07/2016 FINDINGS: Intraoperative cholangiographic images of the right upper abdominal quadrant during laparoscopic cholecystectomy are provided for review. Surgical clips overlie the expected location of the gallbladder fossa. Initial attempted cholangiogram demonstrates extravasation of contrast about the gallbladder fossa. Subsequent contrast injection demonstrates opacification of the cystic duct with minimal amount of additional extravasation. There is passage of contrast through the central aspect of the cystic duct with filling of a non dilated common bile duct. There is passage of contrast though the CBD and into the descending portion of the duodenum. There is minimal reflux of injected contrast into the common hepatic duct and central aspect of the non dilated intrahepatic biliary system. Apparent filling defect within the mid aspect the common bile duct is favored to represent the right L2 pedicle and not a discrete intraluminal filling defect. No definitive persistent filling defects within the opacified portion of the biliary tree to suggest choledocholithiasis. IMPRESSION: Degraded examination without definitive evidence of choledocholithiasis. Electronically Signed   By: Eldridge Abrahams.D.  On: 11/10/2016 12:49    Anti-infectives: Anti-infectives    Start     Dose/Rate Route Frequency Ordered Stop   11/11/16 2200  amoxicillin-clavulanate (AUGMENTIN) 875-125 MG per tablet 1 tablet     1 tablet Oral Every 12 hours 11/11/16 1420 11/16/16 2159   11/06/16 0630  piperacillin-tazobactam (ZOSYN) IVPB 3.375 g  Status:  Discontinued     3.375 g 12.5 mL/hr  over 240 Minutes Intravenous Every 8 hours 11/06/16 0620 11/11/16 1420       Assessment/Plan Cholecystitis with SIRS/sepsis - sepsis physiology resolved S/p lap cholecystectomy with cholangiogram 9/4 Dr. Barry Dienes - POD#2 - MRCP done 9- negative for choledocholithiasis, findings suggestive of acute calculous cholecystitis - no filling defect on cholangiogram - WBC 11.6, afebrile - can discontinue IV Zosyn - LFTs trending down  - tolerating diet  AFib with RVR with acute resp failure - off BIPAP - new onset AFib, likely related to infection  - stress test: Low risk stress nuclear study with a small area of apicoseptal ischemia and borderline left ventricular systolic function AKI - Cr 1.09 RLL lung nodule on CT - incidental finding, will need OP f/u  FEN - NPO, IVF VTE - SCDs, ok to resume anticoagulation ID - IV Zosyn (8/31>9/5), PO augmentin (9/5>>) day 1/7, blood cxs with no growth  Plan: Stable for discharge home from a surgical standpoint. Send on 1 week augmentin. Patient will f/u in [place] clinic. Have written letter to return to work 9/17.   LOS: 6 days    Brigid Re , [facility] 11/12/2016, 9:43 AM Pager: [phone] Consults: [phone] Mon-Fri 7:00 am-4:30 pm Sat-Sun 7:00 am-11:30 am

## 2016-11-12 NOTE — Discharge Summary (Signed)
Physician Discharge Summary  Tara Serrano SEG:[id number] DOB: [birth year] DOA: 11/06/2016  PCP: Olin Hauser, MD  Admit date: 11/06/2016 Discharge date: 11/12/2016  Time spent: over 30 minutes  Recommendations for Outpatient Follow-up:  1. Follow up repeat CBC and CMP (f/u K  - started on K supplementation, note we also started low dose Acei today - as well as Cr and LFTs) 2. Follow up with cardiology for new onset afib as well as HF, on eliquis 3. Follow up with surgery 4. She had several incidental imaging findings, most notably the L rib lesion concerning for met vs plasmacytoma and the 10 mm R lower lung nodule.  She'll need follow up as recommended by rads (see below, recommended outpatient PET-CT or bx for rib lesion).  Of note, her report, she's up to date with colonoscopy, but not mammogram.  No hx of abnormal paps, but last in her 39's.    Discharge Diagnoses:  Active Problems:   Acute cholecystitis   Atrial fibrillation (HCC)   Cholangitis   Elevated LFTs   Pulmonary nodule   Rib lesion   Discharge Condition: stable  Diet recommendation: heart healthy  Filed Weights   11/09/16 0515 11/10/16 0500 11/11/16 0500  Weight: 88.8 kg (195 lb 11.2 oz) 88 kg (194 lb) 87.8 kg (193 lb 9.6 oz)    History of present illness:  71 year old female who presented with abdominal pain. Patient is known to have type 2 diabetes mellitus, hypertension and GERD. Patient reported abdominal pain for about 7 days prior to hospitalization, associated with nausea and poor appetite. On the initial physical examination blood pressure 140/76, heart rate 99, temperature 100.7, respiratory rate 37, oxygen saturation 91% on BiPAP. She was noted to be in moderate respiratory distress, lethargic, moist mucous membranes, heart S1-S2 present, irregular and tachycardic, lungs with bibasilar rales and positive use of accessory muscles, abdomen was tender to deep palpation, active bowel sounds, lower extremities  with no edema. Sodium 136, potassium 2.6, chloride 95, bicarbonate 27, glucose 251, BUN 25, creatinine 1.33, lipase 28, alkaline phosphatase 399, AST 246, AST 163, total bilirubin is 2.5. Lactic acid 2.1, white count 10.0, hemoglobin 12.2, hematocrit 37.8, platelets 256. Urinalysis with 6-30 white cells, specific gravity 1.032, pH 5.0. Abdominal CT with distended gallbladder with mild biliary dilatation findings consistent with cholecystitis and possible cholangitis, hepatomegaly, incidental pulmonary nodule and pancreatic cyst. Chest film with mild right rotation, with inspissated, mildly over-penetrated, positive interstitial infiltrates at bases bilaterally with cephalization of vasculature. EKG with atrial fibrillation 72 bpm with a right bundle branch block. Abdominal ultrasound with cholelithiasis.  Patient admitted to the hospital with the working diagnosis of sepsis due to cholangitis, present on admission, complicated by hypoxic respiratory failure due to cardiogenic pulmonary edema and new onset atrial fibrillation.  Patient regained hemodynamic stability, stress test with low risk, patient underwent surgical intervention, cholecystectomy 09/04.   She was discharged on augmentin with plans for follow up with surgery, cardiology, and her PCP.  Hospital Course:  1. Sepsis due to cholangitis/Cholecystitis, present on admission, complicated with acute systolic heart failure and cardiogenic pulmonary edema (see below).  S/p cholecystectomy on 9/4 Antibiotic therapy with Zosyn IV (8/31 to 9/5).   Will d/c with Augmentin for total of 5 days postop.   Cultures continue to be no growth Improving elevated alk phos and liver enzymes and bilirubin.   2. HFrEF (EF 45-50%)  Hypoxic respiratory failure due to cardiogenic pulmonary edema.  Currently on room air. Continue PO lasix (lasix  decreased to daily) and carvedilol.   3. New onset atrial fibrillation. Rate well controlled, with HR in the  70's. Continue with telemetry monitoring. Patient is high risk for thromboembolic events with CHADsVASc score is up to 5. Apixiban 5 mg BID. Cards planning outpatient f/u with afib clinic.  3. Type 2 diabetes mellitus. Resume home meds   4. Hypertension.  Resume nifedipine, switched to lisinopril from losartan by cards  5. AKI:  Baseline unknown, creatinine peaked to 1.49, currently improved to 0.98.     Incidental Imaging findings:   10 mm R lower lobe pulm nodule: rec f/u CT, PET-CT or tissue sampling at 3 months per rads 11 mm cystic mass on tail of pancreas: f/u with reimaging at 2 years per radiology Retroperitoneal LAD: f/u CT abd/pelvis with oral IV contrast in 3 months Expansile 3.7 cm anterior L 5th rib lesion - indeterminant, consider met vs plasmacytoma (rec outpt eval with PET-CT and/or bone biopsy) Subcentimeter T1 hyperintense renal cortical lesions (repeat MRI abdomen with and without contrast in 6-12 months) L adrenal adenoma Hepatomegaly Discussed importance of f/u of these findings esp the rib lesion and lung nodule.    Procedures:  cholecystectomy (i.e. Studies not automatically included, echos, thoracentesis, etc; not x-rays)  Echo (EF 45-50%, hypokinesis, trivial MR, mod biatrial enlargement, dilated RV with mild RV systolic dysfunction, trivial TR, RVSP 36 mmHg, dilated IVC, trivial posterior pericardial effusion)  Consultations:  General surgery, cardiology  Discharge Exam: Vitals:   11/12/16 0749 11/12/16 1453  BP: (!) 158/80 (!) 151/87  Pulse: 73 76  Resp: 18 18  Temp: 98.6 F (37 C) 97.6 F (36.4 C)  SpO2: 91% 92%    General: No acute distress. Cardiovascular: Heart sounds show a regular rate, and rhythm. No gallops or rubs. No murmurs. No JVD. Lungs: Clear to auscultation bilaterally with good air movement. No rales, rhonchi or wheezes. Abdomen: Soft, nontender, nondistended with normal active bowel sounds. No masses. No  hepatosplenomegaly. Neurological: Alert and oriented 3. Moves all extremities 4 with equal strength. Cranial nerves II through XII grossly intact. Skin: Warm and dry. No rashes or lesions. Extremities: No clubbing or cyanosis. No edema. Pedal pulses 2+.   Discharge Instructions   Discharge Instructions    Call MD for:  persistant nausea and vomiting    Complete by:  As directed    Call MD for:  redness, tenderness, or signs of infection (pain, swelling, redness, odor or green/yellow discharge around incision site)    Complete by:  As directed    Call MD for:  severe uncontrolled pain    Complete by:  As directed    Call MD for:  temperature >100.4    Complete by:  As directed    Diet - low sodium heart healthy    Complete by:  As directed    Discharge instructions    Complete by:  As directed    You were admitted for cholecystitis.  Please complete the course of antibiotics (should complete on 9/10).  Please follow up with surgery.  You were also seen for atrial fibrillation.  We started you on eliquis for anticoagulation.  Please follow up with cardiology.  You should get a paper prescription for the eliquis that you can use your coupon with.  You also had several imaging findings we discussed that need to be followed up.  Please schedule follow up with your PCP within the next week.  I'm sending you out on a bit of extra  potassium as well, but this may be discontinued when Dr. Claiborne Billings checks your labs.   Increase activity slowly    Complete by:  As directed      Discharge Medication List as of 11/12/2016  3:40 PM    START taking these medications   Details  amoxicillin-clavulanate (AUGMENTIN) 875-125 MG tablet Take 1 tablet by mouth every 12 (twelve) hours., Starting Thu 11/12/2016, Until Mon 11/16/2016, Normal    apixaban (ELIQUIS) 5 MG TABS tablet Take 1 tablet (5 mg total) by mouth 2 (two) times daily., Starting Thu 11/12/2016, Print    lisinopril (PRINIVIL,ZESTRIL) 2.5 MG tablet  Take 1 tablet (2.5 mg total) by mouth daily., Starting Thu 11/12/2016, Until Sat 12/12/2016, Normal    potassium chloride (K-DUR) 10 MEQ tablet Take 2 tablets (20 mEq total) by mouth daily., Starting Thu 11/12/2016, Until Sat 12/12/2016, Normal      CONTINUE these medications which have CHANGED   Details  furosemide (LASIX) 20 MG tablet Take 1 tablet (20 mg total) by mouth daily., Starting Thu 11/12/2016, Until Sat 12/12/2016, Normal      CONTINUE these medications which have NOT CHANGED   Details  carvedilol (COREG) 25 MG tablet Take 25 mg by mouth 2 (two) times daily with a meal., Historical Med    glimepiride (AMARYL) 4 MG tablet Take 8 mg by mouth daily before breakfast., Historical Med    NIFEdipine (ADALAT CC) 90 MG 24 hr tablet Take 90 mg by mouth daily., Historical Med    RABEprazole (ACIPHEX) 20 MG tablet Take 20 mg by mouth 2 (two) times daily. , Historical Med      STOP taking these medications     losartan (COZAAR) 100 MG tablet        Allergies  Allergen Reactions  . Statins Other (See Comments)    Muscle problems   Follow-up Information    [place], Joellen Jersey, NP Follow up.   Specialty:  Nurse Practitioner Why:  follow up with her or with Dr Erlene Quan for liver, pancreas issues at [facility].   Contact information: [address] [place] [address] [phone]        Sur[facility], [place]. Go on 11/26/2016.   Specialty:  General Surgery Why:  Your appointment is at 1:30 PM. Please arrive 30 min prior to appointment time. Bring photo ID and insurance information.  Contact information: [place] [address] [phone]        Baldwin Jamaica, PA-C. Go on 11/25/2016.   Specialty:  Cardiology Why:  _0 :30am for post hospital follow up Contact information: [address] [place] [address] [phone]        Olin Hauser, MD Follow up.   Specialty:  Family Medicine Contact  information: [place]  [phone]            The results of significant diagnostics from this hospitalization (including imaging, microbiology, ancillary and laboratory) are listed below for reference.    Significant Diagnostic Studies: Dg Chest 2 View  Result Date: 11/09/2016 CLINICAL DATA:  Pleuritic pain EXAM: CHEST  2 VIEW COMPARISON:  Chest CT 11/06/2016 FINDINGS: Stable enlarged cardiac silhouette. No effusion, infiltrate pneumothorax. Small effusions and low lung volumes. Remote LEFT rib fracture. IMPRESSION: Basilar atelectasis and small effusions. Electronically Signed   By: Suzy Bouchard M.D.   On: 11/09/2016 08:18   Dg Cholangiogram Operative  Result Date: 11/10/2016 CLINICAL DATA:  Intraoperative cholangiogram during laparoscopic  cholecystectomy. EXAM: INTRAOPERATIVE CHOLANGIOGRAM FLUOROSCOPY TIME:  1 minute, 38 seconds COMPARISON:  MRCP - 11/07/2016 FINDINGS: Intraoperative cholangiographic images of the right upper abdominal quadrant during laparoscopic cholecystectomy are provided for review. Surgical clips overlie the expected location of the gallbladder fossa. Initial attempted cholangiogram demonstrates extravasation of contrast about the gallbladder fossa. Subsequent contrast injection demonstrates opacification of the cystic duct with minimal amount of additional extravasation. There is passage of contrast through the central aspect of the cystic duct with filling of a non dilated common bile duct. There is passage of contrast though the CBD and into the descending portion of the duodenum. There is minimal reflux of injected contrast into the common hepatic duct and central aspect of the non dilated intrahepatic biliary system. Apparent filling defect within the mid aspect the common bile duct is favored to represent the right L2 pedicle and not a discrete intraluminal filling defect. No definitive persistent filling defects within the  opacified portion of the biliary tree to suggest choledocholithiasis. IMPRESSION: Degraded examination without definitive evidence of choledocholithiasis. Electronically Signed   By: Sandi Mariscal M.D.   On: 11/10/2016 12:49   Nm Hepatobiliary Liver Func  Addendum Date: 11/07/2016   ADDENDUM REPORT: 11/07/2016 10:47 ADDENDUM: Additional planar images were obtained 1 day post radiopharmaceutical administration. There is no evidence of radiotracer accumulation in the extrahepatic biliary tree or small bowel. Findings favor high-grade CBD obstruction, as was suggested on prior CT which demonstrated mild biliary dilatation. Correlate with laboratory studies. Consider ERCP for further evaluation and potential decompression. Electronically Signed   By: Lucrezia Europe M.D.   On: 11/07/2016 10:47   Result Date: 11/07/2016 CLINICAL DATA:  Cholecystitis cholangitis. Non-surgical candidate. Pre stent placement evaluation. EXAM: NUCLEAR MEDICINE HEPATOBILIARY IMAGING TECHNIQUE: Sequential images of the abdomen were obtained out to 60 minutes following intravenous administration of radiopharmaceutical. RADIOPHARMACEUTICALS:  5.1 mCi Tc-49m Choletec IV COMPARISON:  Right upper quadrant abdomen ultrasound and abdomen pelvis CT obtained earlier today. FINDINGS: There is prompt uptake of tracer by the liver with no excreted tracer seen in the biliary tree up to 2 hours after injection. IMPRESSION: No excretion of tracer into the biliary tree up to 2 hours after injection. This can be seen with severe hepatocellular disease or complete common bile duct obstruction. Delayed images will be obtained in morning on 11/07/2016 to help differentiate between these possibilities. Electronically Signed: By: SClaudie ReveringM.D. On: 11/06/2016 18:52   Ct Abdomen Pelvis W Contrast  Result Date: 11/06/2016 CLINICAL DATA:  Epigastric pain for 1 week. Fever. Suspect infection. History of hypertension and diabetes. EXAM: CT ABDOMEN AND PELVIS WITH  CONTRAST TECHNIQUE: Multidetector CT imaging of the abdomen and pelvis was performed using the standard protocol following bolus administration of intravenous contrast. CONTRAST:  1079mISOVUE-300 IOPAMIDOL (ISOVUE-300) INJECTION 61% COMPARISON:  Abdominal ultrasound November 06, 2016 at 0458 hours FINDINGS: LOWER CHEST: 10 mm RIGHT lower lobe pulmonary nodule (axial 2/66, series 5). Hazy ground-glass opacities with interlobular septal thickening and atelectasis. The heart is mildly enlarged. Mild coronary artery calcifications. No pericardial effusion. Retained versus refluxed contrast in distal esophagus. HEPATOBILIARY: Distended gallbladder. Minimal intrahepatic biliary dilatation. The liver is 24 cm in cranial caudad dimension . Common bile duct is 10 mm without choledocholithiasis. PANCREAS: 11 mm cystic mass in tail of the pancreas, pancreas is otherwise unremarkable. SPLEEN: Normal. ADRENALS/URINARY TRACT: Kidneys are orthotopic, demonstrating symmetric enhancement. 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 Multifocal scarring in  too small to characterize hypodensities bilateral kidneys. No nephrolithiasis or hydronephrosis. 12 mm cyst upper pole LEFT kidney. The unopacified ureters are normal in course and caliber. Delayed imaging through the kidneys demonstrates symmetric prompt contrast excretion within the proximal urinary collecting system. Urinary bladder is partially distended and unremarkable. Normal adrenal glands. STOMACH/BOWEL: The stomach, small and large bowel are normal in course and caliber without inflammatory changes, sensitivity decreased without oral contrast. Moderate descending and sigmoid colonic diverticulosis. The appendix is not discretely identified, however there are no inflammatory changes in the right lower quadrant. VASCULAR/LYMPHATIC: Aortoiliac vessels are normal in course and caliber. Mild calcific atherosclerosis. No  lymphadenopathy by CT size criteria. REPRODUCTIVE: Normal. OTHER: No intraperitoneal free fluid or free air. MUSCULOSKELETAL: Expanded LEFT anterior rib (image 1/ 66), possible old fracture. Osteopenia. Multilevel moderate to severe degenerative change of the spine. IMPRESSION: 1. Distended gallbladder with mild biliary dilatation. Findings seen with cholecystitis or, possibly cholangitis. 2. Hepatomegaly. 3. Mild cardiomegaly and suspected pulmonary edema. Recommend chest radiograph. 4. **An incidental finding of potential clinical significance has been found. 10 mm RIGHT lower lobe pulmonary nodule. Consider CT, PET-CT or tissue sampling at 3 months. This recommendation follows the consensus statement: Guidelines for Management of Small Pulmonary Nodules Detected on CT Scans: A Statement from the [place] as published in Radiology 2005; 237:395-400. ** 5. **An incidental finding of potential clinical significance has been found. 11 mm cystic mass tail of pancreas. Recommend re- imaging at 2 years. This recommendation follows ACR consensus guidelines: Management of Incidental Pancreatic Cysts: A White Paper of the ACR Incidental Findings Committee. [place] 2956;21:308-657.** Aortic Atherosclerosis (ICD10-I70.0). Electronically Signed   By: Elon Alas M.D.   On: 11/06/2016 06:13   Mr Abdomen Mrcp Wo Contrast  Result Date: 11/07/2016 CLINICAL DATA:  Inpatient. Abnormal liver function tests. Cholelithiasis. Dilated common bile duct. No evidence of radiotracer excretion into the biliary tree or bowel at hepatobiliary scintigraphy study. EXAM: MRI ABDOMEN WITHOUT CONTRAST  (INCLUDING MRCP) TECHNIQUE: Multiplanar multisequence MR imaging of the abdomen was performed. Heavily T2-weighted images of the biliary and pancreatic ducts were obtained, and three-dimensional MRCP images were rendered by post processing. COMPARISON:  11/06/2016 hepatobiliary scintigraphy study, CT abdomen/ pelvis and  right upper quadrant abdominal sonogram. FINDINGS: Lower chest: Re- demonstration of 10 mm solid right lower lobe pulmonary nodule (series 6/ image 1). Mild dependent bibasilar atelectasis. Cardiomegaly. Hepatobiliary: Mild hepatomegaly. No definite liver surface irregularity. No hepatic steatosis. No liver mass. Distended gallbladder (4.4 cm diameter). Multiple subcentimeter gallstones are seen layering in the gallbladder and within the cystic duct. Mild diffuse gallbladder wall thickening. No significant pericholecystic fluid. Mild diffuse periportal edema, not appreciably changed since the CT study from 1 day prior. No intrahepatic biliary ductal dilatation . Common bile duct diameter 5 mm, decreased from 10 mm diameter on 11/06/2016 CT study. No convincing filling defects in the common bile duct to suggest choledocholithiasis. No biliary stricture. Probable tiny periampullary duodenal diverticulum. Pancreas: At least 4 scattered unilocular cystic lesions in the distal pancreatic body and tail, largest 1.1 x 1.1 cm in the pancreatic tail (series 6/ image 24), none of which demonstrate wall thickening or internal complexity. No pancreatic duct dilation. No pancreas divisum. No peripancreatic fluid collections. Spleen: Normal size. No mass. Adrenals/Urinary Tract: Left adrenal 2.0 cm nodule demonstrates significant signal loss on chemical shift imaging, compatible with a benign left adrenal adenoma. No right adrenal nodules. No hydronephrosis. 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 There are numerous subcentimeter T2 hyperintense renal cortical lesions scattered throughout both kidneys, several of which demonstrate T1 hyperintensity, incompletely characterized on this scan. Solitary 0.7 cm T2 hypointense T1 hyperintense renal cortical lesion in the posterior lower left kidney (series 6/ image 42). Stomach/Bowel: Grossly normal stomach. Visualized small and large  bowel is normal caliber, with no bowel wall thickening. Vascular/Lymphatic: Nonaneurysmal abdominal aorta. Porta hepatis adenopathy measuring up to 1.7 cm (series 8/ image 25). Mildly enlarged 1.0 cm gastrohepatic ligament node (series 8/ image 21). Other: Trace perihepatic ascites.  No focal fluid collections. Musculoskeletal: Expansile 3.7 x 3.1 cm osseous lesion in the anterior left fifth rib (series 7/image 7). IMPRESSION: 1. Cholelithiasis. Distended gallbladder. Mild diffuse gallbladder wall thickening. Findings suggest acute calculous cholecystitis in the correct clinical setting. 2. No intrahepatic biliary ductal dilatation. Common bile duct diameter 5 mm, decreased from 10 mm diameter on the CT study from 1 day prior. No evidence of choledocholithiasis. 3. Stable mild diffuse periportal edema, nonspecific, which could be due to IV hydration and/or ascending cholangitis. 4. Expansile 3.7 cm anterior left fifth rib bone lesion, indeterminate. Differential includes metastasis or plasmacytoma. Consider outpatient evaluation with PET-CT and/or bone biopsy, as clinically warranted. 5. Re- demonstration of 10 mm right lower lobe pulmonary nodule. Consider one of the following in 3 months for both low-risk and high-risk individuals: (a) repeat chest CT, (b) follow-up PET-CT, or (c) tissue sampling. This recommendation follows the consensus statement: Guidelines for Management of Incidental Pulmonary Nodules Detected on CT Images: From the Fleischner Society 2017; Radiology 2017; 284:228-243. 6. Nonspecific mild upper retroperitoneal lymphadenopathy. Recommend attention on follow-up CT abdomen/pelvis with oral and IV contrast in 3 months. This recommendation follows ACR consensus guidelines: White Paper of the ACR Incidental Findings Committee II on Splenic and Nodal Findings. J Am Coll Radiol 2013;10:789-794. 7. Left adrenal adenoma. 8. Unilocular cystic pancreatic tail and distal pancreatic body lesions, largest  1.1 cm, without overt high risk MRI features on this noncontrast study. Follow-up MRI abdomen without and with IV contrast recommended in 2 years. This recommendation follows ACR consensus guidelines: Management of Incidental Pancreatic Cysts: A White Paper of the ACR Incidental Findings Committee. J Am Coll Radiol 4193;79:024-097. 9. Subcentimeter T1 hyperintense renal cortical lesions in both kidneys, incompletely characterized on this noncontrast study. Follow-up MRI abdomen without and with IV contrast recommended in 6-12 months. This recommendation follows ACR consensus guidelines: Management of the Incidental Renal Mass on CT: A White Paper of the ACR Incidental Findings Committee. J Am Coll Radiol [phone]. Electronically Signed   By: Ilona Sorrel M.D.   On: 11/07/2016 15:48   Mr 3d Recon At Scanner  Result Date: 11/07/2016 CLINICAL DATA:  Inpatient. Abnormal liver function tests. Cholelithiasis. Dilated common bile duct. No evidence of radiotracer excretion into the biliary tree or bowel at hepatobiliary scintigraphy study. EXAM: MRI ABDOMEN WITHOUT CONTRAST  (INCLUDING MRCP) TECHNIQUE: Multiplanar multisequence MR imaging of the abdomen was performed. Heavily T2-weighted images of the biliary and pancreatic ducts were obtained, and three-dimensional MRCP images were rendered by post processing. COMPARISON:  11/06/2016 hepatobiliary scintigraphy study, CT abdomen/ pelvis and right upper quadrant abdominal sonogram. FINDINGS: Lower chest: Re- demonstration of 10 mm solid right lower lobe pulmonary nodule (series 6/ image 1). Mild dependent bibasilar atelectasis. Cardiomegaly. Hepatobiliary: Mild hepatomegaly. No definite liver surface irregularity. No hepatic steatosis. No liver mass. Distended gallbladder (4.4 cm diameter). Multiple subcentimeter gallstones are seen layering in the gallbladder  and within the cystic duct. Mild diffuse gallbladder wall thickening. No significant pericholecystic  fluid. Mild diffuse periportal edema, not appreciably changed since the CT study from 1 day prior. No intrahepatic biliary ductal dilatation . Common bile duct diameter 5 mm, decreased from 10 mm diameter on 11/06/2016 CT study. No convincing filling defects in the common bile duct to suggest choledocholithiasis. No biliary stricture. Probable tiny periampullary duodenal diverticulum. Pancreas: At least 4 scattered unilocular cystic lesions in the distal pancreatic body and tail, largest 1.1 x 1.1 cm in the pancreatic tail (series 6/ image 24), none of which demonstrate wall thickening or internal complexity. No pancreatic duct dilation. No pancreas divisum. No peripancreatic fluid collections. Spleen: Normal size. No mass. Adrenals/Urinary Tract: Left adrenal 2.0 cm nodule demonstrates significant signal loss on chemical shift imaging, compatible with a benign left adrenal adenoma. No right adrenal nodules. No hydronephrosis. 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 There are numerous subcentimeter T2 hyperintense renal cortical lesions scattered throughout both kidneys, several of which demonstrate T1 hyperintensity, incompletely characterized on this scan. Solitary 0.7 cm T2 hypointense T1 hyperintense renal cortical lesion in the posterior lower left kidney (series 6/ image 42). Stomach/Bowel: Grossly normal stomach. Visualized small and large bowel is normal caliber, with no bowel wall thickening. Vascular/Lymphatic: Nonaneurysmal abdominal aorta. Porta hepatis adenopathy measuring up to 1.7 cm (series 8/ image 25). Mildly enlarged 1.0 cm gastrohepatic ligament node (series 8/ image 21). Other: Trace perihepatic ascites.  No focal fluid collections. Musculoskeletal: Expansile 3.7 x 3.1 cm osseous lesion in the anterior left fifth rib (series 7/image 7). IMPRESSION: 1. Cholelithiasis. Distended gallbladder. Mild diffuse gallbladder wall thickening. Findings  suggest acute calculous cholecystitis in the correct clinical setting. 2. No intrahepatic biliary ductal dilatation. Common bile duct diameter 5 mm, decreased from 10 mm diameter on the CT study from 1 day prior. No evidence of choledocholithiasis. 3. Stable mild diffuse periportal edema, nonspecific, which could be due to IV hydration and/or ascending cholangitis. 4. Expansile 3.7 cm anterior left fifth rib bone lesion, indeterminate. Differential includes metastasis or plasmacytoma. Consider outpatient evaluation with PET-CT and/or bone biopsy, as clinically warranted. 5. Re- demonstration of 10 mm right lower lobe pulmonary nodule. Consider one of the following in 3 months for both low-risk and high-risk individuals: (a) repeat chest CT, (b) follow-up PET-CT, or (c) tissue sampling. This recommendation follows the consensus statement: Guidelines for Management of Incidental Pulmonary Nodules Detected on CT Images: From the Fleischner Society 2017; Radiology 2017; 284:228-243. 6. Nonspecific mild upper retroperitoneal lymphadenopathy. Recommend attention on follow-up CT abdomen/pelvis with oral and IV contrast in 3 months. This recommendation follows ACR consensus guidelines: White Paper of the ACR Incidental Findings Committee II on Splenic and Nodal Findings. J Am Coll Radiol 2013;10:789-794. 7. Left adrenal adenoma. 8. Unilocular cystic pancreatic tail and distal pancreatic body lesions, largest 1.1 cm, without overt high risk MRI features on this noncontrast study. Follow-up MRI abdomen without and with IV contrast recommended in 2 years. This recommendation follows ACR consensus guidelines: Management of Incidental Pancreatic Cysts: A White Paper of the ACR Incidental Findings Committee. J Am Coll Radiol 2017;14:911-923. 9. Subcentimeter T1 hyperintense renal cortical lesions in both kidneys, incompletely characterized on this noncontrast study. Follow-up MRI abdomen without and with IV contrast recommended  in 6-12 months. This recommendation follows ACR consensus guidelines: Management of the Incidental Renal Mass on CT: A White Paper of the ACR Incidental Findings Committee. J Am  Coll Radiol 2018;15:264-273. Electronically Signed   By: Ilona Sorrel M.D.   On: 11/07/2016 15:48   Nm Myocar Multi W/spect W/wall Motion / Ef  Result Date: 11/09/2016  There was no ST segment deviation noted during stress.  No T wave inversion was noted during stress.  Defect 1: There is a small defect of mild severity present in the apical septal location.  Findings consistent with ischemia.  This is a low risk study.  The left ventricular ejection fraction is mildly decreased (45-54%).  Nuclear stress EF: 50%.  Low risk stress nuclear study with a small area of apicoseptal ischemia and borderline left ventricular systolic function.   Dg Chest Port 1 View  Result Date: 11/07/2016 CLINICAL DATA:  Acute respiratory distress. EXAM: PORTABLE CHEST 1 VIEW COMPARISON:  One-view chest x-ray 11/06/2016. FINDINGS: The heart size is exaggerated by low lung volumes. Aeration of both lungs is improving. Persistent lower lobe airspace disease is noted, left greater than right. Mild pulmonary vascular congestion is noted as well. IMPRESSION: 1. Improving aeration. 2. Mild pulmonary vascular congestion remains. 3. Left greater than right residual basilar airspace disease. This is concerning for left lower lobe pneumonia. Electronically Signed   By: San Morelle M.D.   On: 11/07/2016 08:41   Dg Chest Portable 1 View  Result Date: 11/06/2016 CLINICAL DATA:  Shortness of breath. EXAM: PORTABLE CHEST 1 VIEW COMPARISON:  No prior. FINDINGS: Cardiomegaly with pulmonary venous congestion and diffuse bilateral pulmonary infiltrates consistent with pulmonary edema. No pleural effusion or pneumothorax. IMPRESSION: Cardiomegaly with pulmonary venous congestion and diffuse bilateral pulmonary infiltrates consistent with pulmonary edema .  Electronically Signed   By: Marcello Moores  Register   On: 11/06/2016 06:40   US Abdomen Limited Ruq  Result Date: 11/06/2016 CLINICAL DATA:  Abnormal liver function tests. EXAM: ULTRASOUND ABDOMEN LIMITED RIGHT UPPER QUADRANT COMPARISON:  None. FINDINGS: Gallbladder: There are small calculi measuring a few mm each. There is a small volume intraluminal sludge. The patient was not tender to probe pressure over the gallbladder. There is mild mural thickening, 4.8 mm. No pericholecystic fluid. Common bile duct: Diameter: Upper normal, 7.2 mm. Liver: Diffuse echogenicity of hepatic parenchyma without focal lesion. Portal vein is patent on color Doppler imaging with normal direction of blood flow towards the liver. IMPRESSION: 1. Cholelithiasis. Slight gallbladder mural thickening. No tenderness over the gallbladder. 2. Increased echogenicity of hepatic parenchyma may represent fatty infiltration or hepatocellular disease. Electronically Signed   By: Andreas Newport M.D.   On: 11/06/2016 05:10    Microbiology: Recent Results (from the past 240 hour(s))  Blood culture (routine x 2)     Status: None   Collection Time: 11/06/16  4:00 AM  Result Value Ref Range Status   Specimen Description BLOOD RIGHT ANTECUBITAL  Final   Special Requests   Final    BOTTLES DRAWN AEROBIC AND ANAEROBIC Blood Culture adequate volume   Culture NO GROWTH 5 DAYS  Final   Report Status 11/11/2016 FINAL  Final  Blood culture (routine x 2)     Status: None   Collection Time: 11/06/16  4:10 AM  Result Value Ref Range Status   Specimen Description BLOOD LEFT ARM  Final   Special Requests IN PEDIATRIC BOTTLE Blood Culture adequate volume  Final   Culture NO GROWTH 5 DAYS  Final   Report Status 11/11/2016 FINAL  Final  MRSA PCR Screening     Status: None   Collection Time: 11/06/16  9:21 AM  Result Value  Ref Range Status   MRSA by PCR NEGATIVE NEGATIVE Final    Comment:        The GeneXpert MRSA Assay (FDA approved for NASAL  specimens only), is one component of a comprehensive MRSA colonization surveillance program. It is not intended to diagnose MRSA infection nor to guide or monitor treatment for MRSA infections.      Labs: Basic Metabolic Panel:  Recent Labs Lab 11/06/16 0331 11/07/16 0237 11/08/16 1046 11/09/16 0359 11/10/16 0307 11/11/16 0423 11/12/16 0522  NA  --  140 144 142 139 137 139  K  --  3.1* 3.1* 3.4* 5.2* 4.2 3.4*  CL  --  98* 102 102 100* 98* 101  CO2  --  29 30 37* _0 GLUCOSE  --  172* 71 66 80 154* 108*  BUN  --  27* 30* 23* _1 CREATININE  --  1.49* 1.30* 1.13* 1.06* 1.09* 0.98  CALCIUM  --  8.1* 8.2* 8.4* 8.6* 8.3* 8.4*  MG 1.6* 2.0  --   --   --   --   --   PHOS  --  5.7*  --   --   --   --   --    Liver Function Tests:  Recent Labs Lab 11/07/16 0237 11/08/16 1046 11/10/16 0307 11/11/16 0423 11/12/16 0522  AST 186* 63* 39 48* 36  ALT 189* 113* 60* 52 42  ALKPHOS 415* 395* 367* 323* 266*  BILITOT 5.6* 3.4* 3.6* 1.1 1.2  PROT 6.5 6.2* 6.0* 6.6 6.1*  ALBUMIN 2.5* 2.2* 2.2* 2.3* 2.3*    Recent Labs Lab 11/06/16 0223  LIPASE 28   No results for input(s): AMMONIA in the last 168 hours. CBC:  Recent Labs Lab 11/08/16 1046 11/09/16 0359 11/10/16 0307 11/11/16 0423 11/12/16 0522  WBC 11.9* 9.8 9.3 11.0* 11.6*  NEUTROABS  --  7.8* 6.8 9.2*  --   HGB 11.1* 10.6* 11.5* 11.9* 11.6*  HCT 35.1* 33.9* 36.6 38.6 38.2  MCV 89.3 89.0 89.5 90.6 91.6  PLT 216 231 264 336 341   Cardiac Enzymes:  Recent Labs Lab 11/06/16 1035 11/06/16 1309 11/06/16 1953  TROPONINI 0.03* 0.05* 0.03*   BNP: BNP (last 3 results)  Recent Labs  11/06/16 0649  BNP 369.6*    ProBNP (last 3 results) No results for input(s): PROBNP in the last 8760 hours.  CBG:  Recent Labs Lab 11/11/16 1228 11/11/16 1707 11/11/16 2154 11/12/16 0745 11/12/16 1240  GLUCAP 188* 143* 146* 106* 125*       Signed:  A Melven Sartorius MD.  [facility] 11/12/2016, 8:08 PM

## 2016-11-12 NOTE — Discharge Instructions (Signed)
Please arrive at least 30 min before your appointment to complete your check in paperwork.  If you are unable to arrive 30 min prior to your appointment time we may have to cancel or reschedule you. ° °LAPAROSCOPIC SURGERY: POST OP INSTRUCTIONS  °1. DIET: Follow a light bland diet the first 24 hours after arrival home, such as soup, liquids, crackers, etc. Be sure to include lots of fluids daily. Avoid fast food or heavy meals as your are more likely to get nauseated. Eat a low fat the next few days after surgery.  °2. Take your usually prescribed home medications unless otherwise directed. °3. PAIN CONTROL:  °1. Pain is best controlled by a usual combination of three different methods TOGETHER:  °1. Ice/Heat °2. Over the counter pain medication °3. Prescription pain medication °2. Most patients will experience some swelling and bruising around the incisions. Ice packs or heating pads (30-60 minutes up to 6 times a day) will help. Use ice for the first few days to help decrease swelling and bruising, then switch to heat to help relax tight/sore spots and speed recovery. Some people prefer to use ice alone, heat alone, alternating between ice & heat. Experiment to what works for you. Swelling and bruising can take several weeks to resolve.  °3. It is helpful to take an over-the-counter pain medication regularly for the first few weeks. Choose one of the following that works best for you:  °1. Naproxen (Aleve, etc) Two 220mg tabs twice a day °2. Ibuprofen (Advil, etc) Three 200mg tabs four times a day (every meal & bedtime) °3. Acetaminophen (Tylenol, etc) 500-650mg four times a day (every meal & bedtime) °4. A prescription for pain medication (such as oxycodone, hydrocodone, etc) should be given to you upon discharge. Take your pain medication as prescribed.  °1. If you are having problems/concerns with the prescription medicine (does not control pain, nausea, vomiting, rash, itching, etc), please call us [phone] to see if we need to switch you to a different pain medicine that will work better for you and/or control your side effect better. °2. If you need a refill on your pain medication, please contact your pharmacy. They will contact our office to request authorization. Prescriptions will not be filled after 5 pm or on week-ends. °4. Avoid getting constipated. Between the surgery and the pain medications, it is common to experience some constipation. Increasing fluid intake and taking a fiber supplement (such as Metamucil, Citrucel, FiberCon, MiraLax, etc) 1-2 times a day regularly will usually help prevent this problem from occurring. A mild laxative (prune juice, Milk of Magnesia, MiraLax, etc) should be taken according to package directions if there are no bowel movements after 48 hours.  °5. Watch out for diarrhea. If you have many loose bowel movements, simplify your diet to bland foods & liquids for a few days. Stop any stool softeners and decrease your fiber supplement. Switching to mild anti-diarrheal medications (Kayopectate, Pepto Bismol) can help. If this worsens or does not improve, please call us. °6. Wash / shower every day. You may shower over the dressings as they are waterproof. Continue to shower over incision(s) after the dressing is off. °7. Remove your waterproof bandages 5 days after surgery. You may leave the incision open to air. You may replace a dressing/Band-Aid to cover the incision for comfort if you wish.  °8. ACTIVITIES as tolerated:  °1. You may resume regular (light) daily activities beginning the next day--such as daily self-care, walking, climbing stairs--gradually   increasing activities as tolerated. If you can walk 30 minutes without difficulty, it is safe to try more intense activity such as jogging, treadmill, bicycling, low-impact aerobics, swimming, etc. 2. Save the most intensive and strenuous activity for last such as sit-ups, heavy lifting, contact sports, etc Refrain  from any heavy lifting or straining until you are off narcotics for pain control.  3. DO NOT PUSH THROUGH PAIN. Let pain be your guide: If it hurts to do something, don't do it. Pain is your body warning you to avoid that activity for another week until the pain goes down. 4. You may drive when you are no longer taking prescription pain medication, you can comfortably wear a seatbelt, and you can safely maneuver your car and apply brakes. 5. You may have sexual intercourse when it is comfortable.  9. FOLLOW UP in our office  1. Please call [facility] at [phone] to set up an appointment to see your surgeon in the office for a follow-up appointment approximately 2-3 weeks after your surgery. 2. Make sure that you call for this appointment the day you arrive home to insure a convenient appointment time.      10. IF YOU HAVE DISABILITY OR FAMILY LEAVE FORMS, BRING THEM TO THE               OFFICE FOR PROCESSING.   WHEN TO CALL us [phone]:  1. Poor pain control 2. Reactions / problems with new medications (rash/itching, nausea, etc)  3. Fever over 101.5 F (38.5 C) 4. Inability to urinate 5. Nausea and/or vomiting 6. Worsening swelling or bruising 7. Continued bleeding from incision. 8. Increased pain, redness, or drainage from the incision  The clinic staff is available to answer your questions during regular business hours (8:30am-5pm). Please dont hesitate to call and ask to speak to one of our nurses for clinical concerns.  If you have a medical emergency, go to the nearest emergency room or call 911.  A surgeon from [facility] is always on call at the [facility], [place], [place], [place], [place] [address] ?  MAIN: [phone] ? TOLL FREE: [phone] ?  FAX [phone]  [email or web address]     Information on my medicine - ELIQUIS (apixaban)  Why was Eliquis prescribed for you? Eliquis was  prescribed for you to reduce the risk of a blood clot forming that can cause a stroke if you have a medical condition called atrial fibrillation (a type of irregular heartbeat).  What do You need to know about Eliquis ? Take your Eliquis TWICE DAILY - one tablet in the morning and one tablet in the evening with or without food. If you have difficulty swallowing the tablet whole please discuss with your pharmacist how to take the medication safely.  Take Eliquis exactly as prescribed by your doctor and DO NOT stop taking Eliquis without talking to the doctor who prescribed the medication.  Stopping may increase your risk of developing a stroke.  Refill your prescription before you run out.  After discharge, you should have regular check-up appointments with your healthcare provider that is prescribing your Eliquis.  In the future your dose may need to be changed if your kidney function or weight changes by a significant amount or as you get older.  What do you do if you miss a dose? If you miss a dose, take it as soon as you remember on the same day  and resume taking twice daily.  Do not take more than one dose of ELIQUIS at the same time to make up a missed dose.  Important Safety Information A possible side effect of Eliquis is bleeding. You should call your healthcare provider right away if you experience any of the following: ? Bleeding from an injury or your nose that does not stop. ? Unusual colored urine (red or dark brown) or unusual colored stools (red or black). ? Unusual bruising for unknown reasons. ? A serious fall or if you hit your head (even if there is no bleeding).  Some medicines may interact with Eliquis and might increase your risk of bleeding or clotting while on Eliquis. To help avoid this, consult your healthcare provider or pharmacist prior to using any new prescription or non-prescription medications, including herbals, vitamins, non-steroidal anti-inflammatory  drugs (NSAIDs) and supplements.  This website has more information on Eliquis (apixaban): http://[email or web address].com/eliquis/home

## 2016-11-12 NOTE — Evaluation (Signed)
Physical Therapy Evaluation Patient Details Name: Tara Serrano MRN: [id number] DOB: 30-Jul-1945 Today's Date: 11/12/2016   History of Present Illness  71 y.o. female admitted with sepsis due to cholangitis, complicated by hypoxic respiratory failure due to cardiogenic pulmonary edema and new onset atrial fibrillation. PMH of DM and HTN.  Clinical Impression  Pt is independent with mobility, she ambulated 220' without loss of balance, no further PT indicated. She is ready to DC home from PT standpoint, no follow up needs. PT signing off.     Follow Up Recommendations No PT follow up    Equipment Recommendations  None recommended by PT    Recommendations for Other Services       Precautions / Restrictions Precautions Precautions: None Precaution Comments: denies h/o falls in past 1 year Restrictions Weight Bearing Restrictions: No      Mobility  Bed Mobility Overal bed mobility: Independent                Transfers Overall transfer level: Independent                  Ambulation/Gait Ambulation/Gait assistance: Independent Ambulation Distance (Feet): 220 Feet Assistive device: None Gait Pattern/deviations: WFL(Within Functional Limits)   Gait velocity interpretation: at or above normal speed for age/gender General Gait Details: steady, no LOB  Stairs            Wheelchair Mobility    Modified Rankin (Stroke Patients Only)       Balance Overall balance assessment: Independent                                           Pertinent Vitals/Pain Pain Assessment: No/denies pain    Home Living Family/patient expects to be discharged to:: Private residence Living Arrangements: Spouse/significant other Available Help at Discharge: Family Type of Home: House Home Access: Stairs to enter Entrance Stairs-Rails: None Technical brewer of Steps: 3 Home Layout: One level Home Equipment: Environmental consultant - 2 wheels      Prior  Function Level of Independence: Independent               Hand Dominance        Extremity/Trunk Assessment   Upper Extremity Assessment Upper Extremity Assessment: Overall WFL for tasks assessed    Lower Extremity Assessment Lower Extremity Assessment: Overall WFL for tasks assessed    Cervical / Trunk Assessment Cervical / Trunk Assessment: Normal  Communication   Communication: No difficulties  Cognition Arousal/Alertness: Awake/alert Behavior During Therapy: WFL for tasks assessed/performed Overall Cognitive Status: Within Functional Limits for tasks assessed                                        General Comments      Exercises     Assessment/Plan    PT Assessment Patent does not need any further PT services  PT Problem List         PT Treatment Interventions      PT Goals (Current goals can be found in the Care Plan section)  Acute Rehab PT Goals Patient Stated Goal: 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 : None Difficulty sitting down on and standing up from a chair with arms (e.g., wheelchair, bedside commode, etc,.)?: None Help needed moving to and from a bed to chair (including a wheelchair)?: None Help needed walking in hospital room?: None Help needed climbing 3-5 steps with a railing? : None 6 Click Score: 24    End of Session Equipment Utilized During Treatment: Gait belt Activity Tolerance: Patient tolerated treatment well Patient left: in chair;with call bell/phone within reach;with family/visitor present Nurse Communication:  Mobility status      Time: 1012-1026 PT Time Calculation (min) (ACUTE ONLY): 14 min   Charges:   PT Evaluation $PT Eval Low Complexity: 1 Low     PT G Codes:          Philomena Doheny 11/12/2016, 10:30 AM [phone]

## 2016-11-12 NOTE — Progress Notes (Signed)
PROGRESS NOTE    Tara Serrano  TKZ:[id number] DOB: 07/06/1945 DOA: 11/06/2016 PCP: Olin Hauser, MD    Brief Narrative:  71 year old female who presented with abdominal pain. Patient is known to have type 2 diabetes mellitus, hypertension and GERD. Patient reported abdominal pain for about 7 days prior to hospitalization, associated with nausea and poor appetite. On the initial physical examination blood pressure 140/76, heart rate 99, temperature 100.7, respiratory rate 37, oxygen saturation 91% on BiPAP. She was noted to be in moderate respiratory distress, lethargic, moist mucous membranes, heart S1-S2 present, irregular and tachycardic, lungs with bibasilar rales and positive use of accessory muscles, abdomen was tender to deep palpation, active bowel sounds, lower extremities with no edema. Sodium 136, potassium 2.6, chloride 95, bicarbonate 27, glucose 251, BUN 25, creatinine 1.33, lipase 28, alkaline phosphatase 399, AST 246, AST 163, total bilirubin is 2.5. Lactic acid 2.1, white count 10.0, hemoglobin 12.2, hematocrit 37.8, platelets 256. Urinalysis with 6-30 white cells, specific gravity 1.032, pH 5.0. Abdominal CT with distended gallbladder with mild biliary dilatation findings consistent with cholecystitis and possible cholangitis, hepatomegaly, incidental pulmonary nodule and pancreatic cyst. Chest film with mild right rotation, with inspissated, mildly over-penetrated, positive interstitial infiltrates at bases bilaterally with cephalization of vasculature. EKG with atrial fibrillation 72 bpm with a right bundle branch block. Abdominal ultrasound with cholelithiasis.  Patient admitted to the hospital with the working diagnosis of sepsis due to cholangitis, present on admission, complicated by hypoxic respiratory failure due to cardiogenic pulmonary edema and new onset atrial fibrillation.  Patient regained hemodynamic stability, stress test with low risk, patient underwent surgical  intervention, cholecystectomy 09/04.   D/c pending PT eval.   Assessment & Plan:   Active Problems:   Acute cholecystitis   Atrial fibrillation (HCC)   Cholangitis   1. Sepsis due to cholangitis/Cholecystitis, present on admission, complicated with acute systolic heart failure and cardiogenic pulmonary edema (see below).  Antibiotic therapy with Zosyn IV (8/31 to 9/5).   Will d/c with Augmentin for 5 days.   Cultures continue to be no growth, had cholecystectomy 9/4.   Improving elevated alk phos and liver enzymes and bilirubin.   2. HFrEF (EF 45-50%)  Hypoxic respiratory failure due to cardiogenic pulmonary edema.  Currently on room air. Continue PO lasix and carvedilol.   3. New onset atrial fibrillation. Rate well controlled, with HR in the 70's. Continue with telemetry monitoring. Patient is high risk for thromboembolic events with CHADsVASc score is up to 5. Apixiban 5 mg BID. Cards planning outpatient f/u with afib clinic.  3. Type 2 diabetes mellitus. SSI   4. Hypertension. Elevated BP this AM, will d/c back on losartan and nifedipine.  5. AKI:  Baseline unknown, creatinine peaked to 1.49, currently improved to 0.98.     Incidental Imaging findings:   10 mm R lower lobe pulm nodule: rec f/u CT, PET-CT or tissue sampling at 3 months per rads 11 mm cystic mass on tail of pancreas: f/u with reimaging at 2 years per radiology Retroperitoneal LAD: f/u CT abd/pelvis with oral IV contrast in 3 months Expansile 3.7 cm anterior L 5th rib lesion - indeterminant, consider met vs plasmacytoma (rec outpt eval with PET-CT and/or bone biopsy) Subcentimeter T1 hyperintense renal cortical lesions (repeat MRI abdomen with and without contrast in 6-12 months) L adrenal adenoma Discussed importance of f/u of these findings esp the rib lesion and lung nodule.   DVT prophylaxis: heparin Code Status: partial (no CPR) Family Communication: husband at  bedside Disposition Plan: home,  PT/OT to see   Consultants:   Cardiology, surgery, gastroenterology  Procedures: (Don't include imaging studies which can be auto populated. Include things that cannot be auto populated i.e. Echo, Carotid and venous dopplers, Foley, Bipap, HD, tubes/drains, wound vac, central lines etc)  Laparoscopic cholecystectomy with IOC  Echo, EF 45-50%, hypokinesis, unable to eval diastolic function.  Trivial MR, mod biatrial enlargement, dilated RV with mild RV systolic dysfunction, trivial TR, RVSP 36 mmHG, dilated IVC, trivial posterior pericardial effusion  Antimicrobials: (specify start and planned stop date. Auto populated tables are space occupying and do not give end dates)  zosyn    Subjective: Had BM and urinating.  Sitting up on edge of bed.  Walked to bathroom, slowly.  Feels deconditioned.  Objective: Vitals:   11/11/16 1456 11/11/16 2033 11/11/16 2035 11/12/16 0749  BP: 138/72 131/69 134/74 (!) 158/80  Pulse: 80 78 87 73  Resp: '18 17 19 18  ' Temp: 98 F (36.7 C) 98.2 F (36.8 C) 97.7 F (36.5 C) 98.6 F (37 C)  TempSrc: Oral Oral Oral Oral  SpO2: 96% 97% 97% 91%  Weight:      Height:        Intake/Output Summary (Last 24 hours) at 11/12/16 0947 Last data filed at 11/12/16 0700  Gross per 24 hour  Intake              700 ml  Output             1400 ml  Net             -700 ml   Filed Weights   11/09/16 0515 11/10/16 0500 11/11/16 0500  Weight: 88.8 kg (195 lb 11.2 oz) 88 kg (194 lb) 87.8 kg (193 lb 9.6 oz)    Examination:  General: No acute distress. Cardiovascular: Heart sounds show a irregular rate, and rhythm. No gallops or rubs. No murmurs. No JVD. Lungs: Clear to auscultation bilaterally with good air movement. No rales, rhonchi or wheezes. Abdomen: Soft, nontender, nondistended with normal active bowel sounds. No masses. No hepatosplenomegaly. Neurological: Alert and oriented 3. Moves all extremities 4 with equal strength. Cranial nerves II through XII  grossly intact. Skin: Warm and dry. No rashes or lesions. Extremities: No clubbing or cyanosis. No edema. Pedal pulses 2+. Psychiatric: Mood and affect are normal. Insight and judgment are appropriate.  Data Reviewed: I have personally reviewed following labs and imaging studies  CBC:  Recent Labs Lab 11/08/16 1046 11/09/16 0359 11/10/16 0307 11/11/16 0423 11/12/16 0522  WBC 11.9* 9.8 9.3 11.0* 11.6*  NEUTROABS  --  7.8* 6.8 9.2*  --   HGB 11.1* 10.6* 11.5* 11.9* 11.6*  HCT 35.1* 33.9* 36.6 38.6 38.2  MCV 89.3 89.0 89.5 90.6 91.6  PLT 216 231 264 336 712   Basic Metabolic Panel:  Recent Labs Lab 11/06/16 0331 11/07/16 0237 11/08/16 1046 11/09/16 0359 11/10/16 0307 11/11/16 0423 11/12/16 0522  NA  --  140 144 142 139 137 139  K  --  3.1* 3.1* 3.4* 5.2* 4.2 3.4*  CL  --  98* 102 102 100* 98* 101  CO2  --  29 30 37* '31 30 30  ' GLUCOSE  --  172* 71 66 80 154* 108*  BUN  --  27* 30* 23* '15 17 18  ' CREATININE  --  1.49* 1.30* 1.13* 1.06* 1.09* 0.98  CALCIUM  --  8.1* 8.2* 8.4* 8.6* 8.3* 8.4*  MG 1.6* 2.0  --   --   --   --   --  PHOS  --  5.7*  --   --   --   --   --    GFR: Estimated Creatinine Clearance: 51.9 mL/min (by C-G formula based on SCr of 0.98 mg/dL). Liver Function Tests:  Recent Labs Lab 11/07/16 0237 11/08/16 1046 11/10/16 0307 11/11/16 0423 11/12/16 0522  AST 186* 63* 39 48* 36  ALT 189* 113* 60* 52 42  ALKPHOS 415* 395* 367* 323* 266*  BILITOT 5.6* 3.4* 3.6* 1.1 1.2  PROT 6.5 6.2* 6.0* 6.6 6.1*  ALBUMIN 2.5* 2.2* 2.2* 2.3* 2.3*    Recent Labs Lab 11/06/16 0223  LIPASE 28   No results for input(s): AMMONIA in the last 168 hours. Coagulation Profile:  Recent Labs Lab 11/07/16 0237  INR 1.23   Cardiac Enzymes:  Recent Labs Lab 11/06/16 1035 11/06/16 1309 11/06/16 1953  TROPONINI 0.03* 0.05* 0.03*   BNP (last 3 results) No results for input(s): PROBNP in the last 8760 hours. HbA1C: No results for input(s): HGBA1C in the  last 72 hours. CBG:  Recent Labs Lab 11/11/16 0759 11/11/16 1228 11/11/16 1707 11/11/16 2154 11/12/16 0745  GLUCAP 167* 188* 143* 146* 106*   Lipid Profile: No results for input(s): CHOL, HDL, LDLCALC, TRIG, CHOLHDL, LDLDIRECT in the last 72 hours. Thyroid Function Tests: No results for input(s): TSH, T4TOTAL, FREET4, T3FREE, THYROIDAB in the last 72 hours. Anemia Panel: No results for input(s): VITAMINB12, FOLATE, FERRITIN, TIBC, IRON, RETICCTPCT in the last 72 hours. Sepsis Labs:  Recent Labs Lab 11/06/16 0423 11/06/16 0702 11/06/16 1035 11/06/16 1309 11/07/16 0237 11/08/16 0302  PROCALCITON  --   --  3.59  --  6.98 4.74  LATICACIDVEN 2.10* 4.09* 2.7* 2.5*  --   --     Recent Results (from the past 240 hour(s))  Blood culture (routine x 2)     Status: None   Collection Time: 11/06/16  4:00 AM  Result Value Ref Range Status   Specimen Description BLOOD RIGHT ANTECUBITAL  Final   Special Requests   Final    BOTTLES DRAWN AEROBIC AND ANAEROBIC Blood Culture adequate volume   Culture NO GROWTH 5 DAYS  Final   Report Status 11/11/2016 FINAL  Final  Blood culture (routine x 2)     Status: None   Collection Time: 11/06/16  4:10 AM  Result Value Ref Range Status   Specimen Description BLOOD LEFT ARM  Final   Special Requests IN PEDIATRIC BOTTLE Blood Culture adequate volume  Final   Culture NO GROWTH 5 DAYS  Final   Report Status 11/11/2016 FINAL  Final  MRSA PCR Screening     Status: None   Collection Time: 11/06/16  9:21 AM  Result Value Ref Range Status   MRSA by PCR NEGATIVE NEGATIVE Final    Comment:        The GeneXpert MRSA Assay (FDA approved for NASAL specimens only), is one component of a comprehensive MRSA colonization surveillance program. It is not intended to diagnose MRSA infection nor to guide or monitor treatment for MRSA infections.          Radiology Studies: Dg Cholangiogram Operative  Result Date: 11/10/2016 CLINICAL DATA:   Intraoperative cholangiogram during laparoscopic cholecystectomy. EXAM: INTRAOPERATIVE CHOLANGIOGRAM FLUOROSCOPY TIME:  1 minute, 38 seconds COMPARISON:  MRCP - 11/07/2016 FINDINGS: Intraoperative cholangiographic images of the right upper abdominal quadrant during laparoscopic cholecystectomy are provided for review. Surgical clips overlie the expected location of the gallbladder fossa. Initial attempted cholangiogram demonstrates extravasation of contrast about the  gallbladder fossa. Subsequent contrast injection demonstrates opacification of the cystic duct with minimal amount of additional extravasation. There is passage of contrast through the central aspect of the cystic duct with filling of a non dilated common bile duct. There is passage of contrast though the CBD and into the descending portion of the duodenum. There is minimal reflux of injected contrast into the common hepatic duct and central aspect of the non dilated intrahepatic biliary system. Apparent filling defect within the mid aspect the common bile duct is favored to represent the right L2 pedicle and not a discrete intraluminal filling defect. No definitive persistent filling defects within the opacified portion of the biliary tree to suggest choledocholithiasis. IMPRESSION: Degraded examination without definitive evidence of choledocholithiasis. Electronically Signed   By: Sandi Mariscal M.D.   On: 11/10/2016 12:49        Scheduled Meds: . amoxicillin-clavulanate  1 tablet Oral Q12H  . apixaban  5 mg Oral BID  . carvedilol  25 mg Oral BID WC  . furosemide  20 mg Oral Daily  . insulin aspart  0-9 Units Subcutaneous TID WC  . pantoprazole (PROTONIX) IV  40 mg Intravenous Q24H  . potassium chloride  40 mEq Oral Once   Continuous Infusions:    LOS: 6 days    Time spent: [place], MD [facility] [phone]   If 7PM-7AM, please contact night-coverage [email or web address] Password [email or web address] 11/12/2016, 9:47  AM

## 2016-11-12 NOTE — Progress Notes (Signed)
Progress Note  Patient Name: Tara Serrano Date of Encounter: 11/12/2016  Primary Cardiologist:    Dr. Rayann Heman  Subjective   No chest pain.  No SOB.   Inpatient Medications    Scheduled Meds: . amoxicillin-clavulanate  1 tablet Oral Q12H  . apixaban  5 mg Oral BID  . carvedilol  25 mg Oral BID WC  . furosemide  20 mg Oral Daily  . insulin aspart  0-9 Units Subcutaneous TID WC  . pantoprazole (PROTONIX) IV  40 mg Intravenous Q24H   Continuous Infusions:  PRN Meds: acetaminophen, fentaNYL (SUBLIMAZE) injection, hydrALAZINE, oxyCODONE, traMADol   Vital Signs    Vitals:   11/11/16 1456 11/11/16 2033 11/11/16 2035 11/12/16 0749  BP: 138/72 131/69 134/74 (!) 158/80  Pulse: 80 78 87 73  Resp: 18 17 19 18   Temp: 98 F (36.7 C) 98.2 F (36.8 C) 97.7 F (36.5 C) 98.6 F (37 C)  TempSrc: Oral Oral Oral Oral  SpO2: 96% 97% 97% 91%  Weight:      Height:        Intake/Output Summary (Last 24 hours) at 11/12/16 1257 Last data filed at 11/12/16 0700  Gross per 24 hour  Intake              700 ml  Output              800 ml  Net             -100 ml   Filed Weights   11/09/16 0515 11/10/16 0500 11/11/16 0500  Weight: 195 lb 11.2 oz (88.8 kg) 194 lb (88 kg) 193 lb 9.6 oz (87.8 kg)    Telemetry    NA - Personally Reviewed  ECG    NA - Personally Reviewed  Physical Exam   GEN: No acute distress.   Neck: No  JVD Cardiac: Irregular RR,  murmurs, rubs, or gallops.  Respiratory: Clear  to auscultation bilaterally. GI: Soft, nontender, non-distended  MS:   Trace edema; No deformity. Neuro:  Nonfocal  Psych: Normal affect   Labs    Chemistry Recent Labs Lab 11/10/16 0307 11/11/16 0423 11/12/16 0522  NA 139 137 139  K 5.2* 4.2 3.4*  CL 100* 98* 101  CO2 31 30 30   GLUCOSE 80 154* 108*  BUN 15 17 18   CREATININE 1.06* 1.09* 0.98  CALCIUM 8.6* 8.3* 8.4*  PROT 6.0* 6.6 6.1*  ALBUMIN 2.2* 2.3* 2.3*  AST 39 48* 36  ALT 60* 52 42  ALKPHOS 367* 323* 266*    BILITOT 3.6* 1.1 1.2  GFRNONAA 52* 50* 57*  GFRAA 60* 58* >60  ANIONGAP 8 9 8      Hematology Recent Labs Lab 11/10/16 0307 11/11/16 0423 11/12/16 0522  WBC 9.3 11.0* 11.6*  RBC 4.09 4.26 4.17  HGB 11.5* 11.9* 11.6*  HCT 36.6 38.6 38.2  MCV 89.5 90.6 91.6  MCH 28.1 27.9 27.8  MCHC 31.4 30.8 30.4  RDW 14.5 14.4 15.0  PLT 264 336 341    Cardiac Enzymes Recent Labs Lab 11/06/16 1035 11/06/16 1309 11/06/16 1953  TROPONINI 0.03* 0.05* 0.03*    Recent Labs Lab 11/06/16 0236  TROPIPOC 0.01     BNP Recent Labs Lab 11/06/16 0649  BNP 369.6*     DDimer No results for input(s): DDIMER in the last 168 hours.   Radiology    No results found.  Cardiac Studies   Echo:  9/1   Study Conclusions  - Procedure narrative:  Transthoracic echocardiography. Technically   difficult study. Intravenous contrast (Definity) was   administered. - Left ventricle: The cavity size was normal. Wall thickness was   normal. Systolic function was mildly reduced. The estimated   ejection fraction was in the range of 45% to 50%. Diffuse   hypokinesis. The study is not technically sufficient to allow   evaluation of LV diastolic function. - Aortic valve: Trileaflet. Sclerosis without stenosis. There was   no regurgitation. - Mitral valve: Mildly thickened leaflets . There was trivial   regurgitation. - Left atrium: Moderately dilated. - Right ventricle: The cavity size was mildly dilated. Mildly   reduced systolic function. - Right atrium: Moderately dilated. - Tricuspid valve: There was trivial regurgitation. - Pulmonary arteries: PA peak pressure: 36 mm Hg (S). - Inferior vena cava: The vessel was dilated. The respirophasic   diameter changes were blunted (< 50%), consistent with elevated   central venous pressure. - Pericardium, extracardiac: A trivial pericardial effusion was   identified posterior to the heart.   Patient Profile     71 y.o. female with  hx of DM and  HTNadmitted with cholangitis . Cardiology is asked to see for afib. Found to have LVEF of 40-45% on echo. Also had profound hypokalemia.  Assessment & Plan    PERSISTENT ATRIAL FIB:   HR still slightly irregular.  She has a good rate control and is on anticoagulation.  We will arrange follow up with Dr. Rayann Heman.    ACUTE SYSTOLIC HF:  Mildy reduced EF.  I will add a low dose of Lisinopril.    She is on Coreg.   HTN:  This is being managed in the context of treating his CHF  Signed, Minus Breeding, MD  11/12/2016, 12:57 PM

## 2016-11-12 NOTE — Progress Notes (Signed)
Discharge home. Home discharge instruction given, no question verbalized. 

## 2016-11-22 NOTE — Progress Notes (Signed)
Cardiology Office Note Date:  11/25/2016  Patient ID:  Tara Serrano, Tara Serrano 04-08-1945, MRN [id number] PCP:  Olin Hauser, MD  Cardiologist:  New to [facility] last admit, Dr. Rayann Heman   Chief Complaint: post hospital visit  History of Present Illness: Tara Serrano is a 71 y.o. female with history of DM, HTN, GERD, recently admitted to [facility] 11/06/16 with abd pain, cholangitis.  AT admission she was found to be in rate controlled AFib and fluid OL, with reduced LVEF, all problems new for her.  She was diuresed, her rate remained controlled, underwent stress testing with small ischemic defect/low risk study and had her lap chole done 11/10/16, started on Eliquis post-op for high CHADs score.  She had an incidental finding of a L rib lesion that was recommended to have f/u out patient with her PMD.   She is feeling quite well.  She denies any kind of cardiac symptoms or awareness either before or since her hospital stay.  She has never had any kind of CP, palpitations or SOB, denies any exertional intolerances, no near syncope or syncope.  She denies any symptoms of PND or orthopnea.  She has had for years LE swelling that currently is the best it has been in years.  She is tolerating her medicines well, and denies any bleeding or signs of bleeding.  Past Medical History:  Diagnosis Date  . Acute cholecystitis 11/06/2016  . Atrial fibrillation ([place])   . Cholangitis   . Diabetes mellitus without complication ([place])   . Elevated LFTs   . GERD (gastroesophageal reflux disease)   . Hypertension   . Pulmonary nodule   . Rib lesion     Past Surgical History:  Procedure Laterality Date  . CHOLECYSTECTOMY N/A 11/10/2016   Procedure: LAPAROSCOPIC CHOLECYSTECTOMY WITH INTRAOPERATIVE CHOLANGIOGRAM;  Surgeon: Stark Klein, MD;  Location: [place];  Service: General;  Laterality: N/A;    Current Outpatient Prescriptions  Medication Sig Dispense Refill  . apixaban (ELIQUIS) 5 MG TABS tablet Take 1 tablet (5  mg total) by mouth 2 (two) times daily. 60 tablet 0  . carvedilol (COREG) 25 MG tablet Take 25 mg by mouth 2 (two) times daily with a meal.    . furosemide (LASIX) 20 MG tablet Take 1 tablet (20 mg total) by mouth daily. 30 tablet 0  . glimepiride (AMARYL) 4 MG tablet Take 8 mg by mouth daily before breakfast.    . lisinopril (PRINIVIL,ZESTRIL) 2.5 MG tablet Take 1 tablet (2.5 mg total) by mouth daily. 30 tablet 0  . NIFEdipine (ADALAT CC) 90 MG 24 hr tablet Take 90 mg by mouth daily.    . potassium chloride (K-DUR) 10 MEQ tablet Take 2 tablets (20 mEq total) by mouth daily. 60 tablet 0  . RABEprazole (ACIPHEX) 20 MG tablet Take 20 mg by mouth 2 (two) times daily.      No current facility-administered medications for this visit.     Allergies:   Statins   Social History:  The patient  reports that she has never smoked. She has never used smokeless tobacco. She reports that she does not drink alcohol or use drugs.   Family History:  The patient's family history includes Cancer in her brother; Chronic Renal Failure in her father; Diabetes in her mother; Hypertension in her mother; Melanoma in her brother; Stroke in her mother.  ROS:  Please see the history of present illness.   All other systems are reviewed and otherwise negative.   PHYSICAL EXAM:  VS:  BP 126/68   Pulse 60   Ht 5' (1.524 m)   Wt 184 lb (83.5 kg)   BMI 35.94 kg/m  BMI: Body mass index is 35.94 kg/m. Well nourished, well developed, in no acute distress  HEENT: normocephalic, atraumatic  Neck: no JVD, carotid bruits or masses Cardiac:  RRR; no significant murmurs, no rubs, or gallops Lungs:  CTA b/l, no wheezing, rhonchi or rales  Abd: soft, nontender, obesity MS: no deformity or atrophy Ext: no pitting edema  Skin: warm and dry, no rash Neuro:  No gross deficits appreciated Psych: euthymic mood, full affect   EKG:  Done today and reviewed by myself is SR, RBBB  11/07/16: TTE Study Conclusions - Procedure  narrative: Transthoracic echocardiography. Technically difficult study. Intravenous contrast (Definity) was administered. - Left ventricle: The cavity size was normal. Wall thickness was normal. Systolic function was mildly reduced. The estimated ejection fraction was in the range of 45% to 50%. Diffuse hypokinesis. The study is not technically sufficient to allow evaluation of LV diastolic function. - Aortic valve: Trileaflet. Sclerosis without stenosis. There was no regurgitation. - Mitral valve: Mildly thickened leaflets . There was trivial regurgitation. - Left atrium: Moderately dilated. - Right ventricle: The cavity size was mildly dilated. Mildly reduced systolic function. - Right atrium: Moderately dilated. - Tricuspid valve: There was trivial regurgitation. - Pulmonary arteries: PA peak pressure: 36 mm Hg (S). - Inferior vena cava: The vessel was dilated. The respirophasic diameter changes were blunted (<50%), consistent with elevated central venous pressure. - Pericardium, extracardiac: A trivial pericardial effusion was identified posterior to the heart. Impressions: - Technically difficult study. Definity contrast given. LVEF 45-50%, normal wall thickness, global hypokinesis, trivial MR, moderate biatrial enlargement, dilated RV with mild RV systolic dysfunction, trivial TR, RVSP 36 mmHg, dilated IVC, trivial posterior pericardial effusion.  Stress test 11/09/16  There was no ST segment deviation noted during stress.  No T wave inversion was noted during stress.  Defect 1: There is a small defect of mild severity present in the apical septal location.  Findings consistent with ischemia.  This is a low risk study.  The left ventricular ejection fraction is mildly decreased (45-54%).  Nuclear stress EF: 50%. Low risk stress nuclear study with a small area of apicoseptal ischemia and borderline left ventricular systolic  function.  Recent Labs: 11/06/2016: B Natriuretic Peptide 369.6 11/07/2016: Magnesium 2.0 11/12/2016: ALT 42; BUN 18; Creatinine, Ser 0.98; Hemoglobin 11.6; Platelets 341; Potassium 3.4; Sodium 139  No results found for requested labs within last 8760 hours.   Estimated Creatinine Clearance: 50.5 mL/min (by C-G formula based on SCr of 0.98 mg/dL).   Wt Readings from Last 3 Encounters:  11/25/16 184 lb (83.5 kg)  11/11/16 193 lb 9.6 oz (87.8 kg)  05/28/12 207 lb (93.9 kg)     Other studies reviewed: Additional studies/records reviewed today include: summarized above  ASSESSMENT AND PLAN:  1. Persistent AFib     CHA2DS2Vasc is at least 4, on Eliquis, appropriately dosed     SR today, ?provoked by illness  2. HTN     Looks OK, no changes  3. CM     Weight is down, exam does not suggest fluid OL  4. Abnormal stress test     No cardiac symptoms      Discussed risk reduction and management, exercise, weight loss, DM and lipid control     She reports trying numerous "statins" and unable to tolerate even low dose.  She follows this with her PMD.     Suggest perhaps considering PSK9 inhibitors, she would like to continue with her PMD lipid management   Disposition: F/u with an echo in 3 months, follow up in 4 months, sooner if needed.  Current medicines are reviewed at length with the patient today.  The patient did not have any concerns regarding medicines.  Venetia Night, PA-C 11/25/2016 11:27 AM     [place] [address] [place] [id number] [phone] (office)  [phone] (fax)

## 2016-11-25 ENCOUNTER — Ambulatory Visit (INDEPENDENT_AMBULATORY_CARE_PROVIDER_SITE_OTHER): Payer: Medicare Other | Admitting: Physician Assistant

## 2016-11-25 ENCOUNTER — Encounter: Payer: Self-pay | Admitting: Physician Assistant

## 2016-11-25 ENCOUNTER — Ambulatory Visit: Payer: Medicare Other | Admitting: Physician Assistant

## 2016-11-25 VITALS — BP 126/68 | HR 60 | Ht 60.0 in | Wt 184.0 lb

## 2016-11-25 DIAGNOSIS — I420 Dilated cardiomyopathy: Secondary | ICD-10-CM | POA: Diagnosis not present

## 2016-11-25 DIAGNOSIS — I480 Paroxysmal atrial fibrillation: Secondary | ICD-10-CM

## 2016-11-25 DIAGNOSIS — I10 Essential (primary) hypertension: Secondary | ICD-10-CM

## 2016-11-25 MED ORDER — POTASSIUM CHLORIDE ER 10 MEQ PO TBCR: 20.0000 meq | EXTENDED_RELEASE_TABLET | Freq: Every day | ORAL | 5 refills | Status: DC

## 2016-11-25 MED ORDER — LISINOPRIL 2.5 MG PO TABS: 2.5000 mg | ORAL_TABLET | Freq: Every day | ORAL | 5 refills | Status: DC

## 2016-11-25 MED ORDER — CARVEDILOL 25 MG PO TABS: 25.0000 mg | ORAL_TABLET | Freq: Two times a day (BID) | ORAL | 5 refills | Status: DC

## 2016-11-25 MED ORDER — FUROSEMIDE 20 MG PO TABS: 20.0000 mg | ORAL_TABLET | Freq: Every day | ORAL | 5 refills | Status: DC

## 2016-11-25 MED ORDER — APIXABAN 5 MG PO TABS: 5.0000 mg | ORAL_TABLET | Freq: Two times a day (BID) | ORAL | 5 refills | Status: DC

## 2016-11-25 NOTE — Addendum Note (Signed)
Addended by: Claude Manges on: 11/25/2016 03:48 PM   Modules accepted: Orders

## 2016-11-25 NOTE — Patient Instructions (Signed)
Medication Instructions:   Your physician recommends that you continue on your current medications as directed. Please refer to the Current Medication list given to you today.   If you need a refill on your cardiac medications before your next appointment, please call your pharmacy.  Labwork: NONE ORDERED  TODAY    Testing/Procedures:  Your physician has requested that you have an echocardiogram. Echocardiography is a painless test that uses sound waves to create images of your heart. It provides your doctor with information about the size and shape of your heart and how well your heart's chambers and valves are working. This procedure takes approximately one hour. There are no restrictions for this procedure.     Follow-Up: IN 4 MONTHS WITH URSUY ( RECALL )   Any Other Special Instructions Will Be Listed Below (If Applicable).

## 2016-11-30 ENCOUNTER — Other Ambulatory Visit (HOSPITAL_COMMUNITY): Payer: Self-pay | Admitting: Nurse Practitioner

## 2016-11-30 DIAGNOSIS — M9988 Other biomechanical lesions of rib cage: Secondary | ICD-10-CM

## 2016-12-10 ENCOUNTER — Encounter (HOSPITAL_COMMUNITY)
Admission: RE | Admit: 2016-12-10 | Discharge: 2016-12-10 | Disposition: A | Discharge status: Home / Self Care | Payer: Medicare Other | Source: Ambulatory Visit | Attending: Nurse Practitioner | Admitting: Nurse Practitioner

## 2016-12-10 DIAGNOSIS — M9988 Other biomechanical lesions of rib cage: Secondary | ICD-10-CM | POA: Diagnosis not present

## 2016-12-10 LAB — GLUCOSE, CAPILLARY: Glucose-Capillary: 114 mg/dL — ABNORMAL HIGH (ref 65–99)

## 2016-12-10 MED ORDER — FLUDEOXYGLUCOSE F - 18 (FDG) INJECTION
9.1600 | Freq: Once | INTRAVENOUS | Status: AC | PRN
  Administered 2016-12-10: 9.16 via INTRAVENOUS

## 2017-01-12 ENCOUNTER — Telehealth: Payer: Self-pay | Admitting: Hematology

## 2017-01-12 ENCOUNTER — Ambulatory Visit (HOSPITAL_BASED_OUTPATIENT_CLINIC_OR_DEPARTMENT_OTHER): Payer: Medicare Other | Admitting: Hematology

## 2017-01-12 ENCOUNTER — Encounter: Payer: Self-pay | Admitting: Hematology

## 2017-01-12 ENCOUNTER — Ambulatory Visit (HOSPITAL_BASED_OUTPATIENT_CLINIC_OR_DEPARTMENT_OTHER): Payer: Medicare Other

## 2017-01-12 ENCOUNTER — Telehealth: Payer: Self-pay

## 2017-01-12 ENCOUNTER — Other Ambulatory Visit: Payer: Self-pay

## 2017-01-12 VITALS — BP 150/73 | HR 73 | Temp 98.9°F | Resp 20 | Ht 60.0 in | Wt 177.7 lb

## 2017-01-12 DIAGNOSIS — D1771 Benign lipomatous neoplasm of kidney: Secondary | ICD-10-CM | POA: Diagnosis not present

## 2017-01-12 DIAGNOSIS — R197 Diarrhea, unspecified: Secondary | ICD-10-CM | POA: Diagnosis not present

## 2017-01-12 DIAGNOSIS — R590 Localized enlarged lymph nodes: Secondary | ICD-10-CM

## 2017-01-12 DIAGNOSIS — R222 Localized swelling, mass and lump, trunk: Secondary | ICD-10-CM | POA: Diagnosis present

## 2017-01-12 DIAGNOSIS — IMO0001 Reserved for inherently not codable concepts without codable children: Secondary | ICD-10-CM

## 2017-01-12 DIAGNOSIS — R9389 Abnormal findings on diagnostic imaging of other specified body structures: Secondary | ICD-10-CM | POA: Diagnosis present

## 2017-01-12 DIAGNOSIS — R911 Solitary pulmonary nodule: Secondary | ICD-10-CM

## 2017-01-12 LAB — CBC & DIFF AND RETIC
BASO%: 0.3 % (ref 0.0–2.0)
BASOS ABS: 0 10*3/uL (ref 0.0–0.1)
EOS%: 2.1 % (ref 0.0–7.0)
Eosinophils Absolute: 0.2 10*3/uL (ref 0.0–0.5)
HEMATOCRIT: 37 % (ref 34.8–46.6)
HGB: 12 g/dL (ref 11.6–15.9)
Immature Retic Fract: 9.9 % (ref 1.60–10.00)
LYMPH%: 19.2 % (ref 14.0–49.7)
MCH: 28.8 pg (ref 25.1–34.0)
MCHC: 32.4 g/dL (ref 31.5–36.0)
MCV: 88.7 fL (ref 79.5–101.0)
MONO#: 0.9 10*3/uL (ref 0.1–0.9)
MONO%: 8.3 % (ref 0.0–14.0)
NEUT#: 7.9 10*3/uL — ABNORMAL HIGH (ref 1.5–6.5)
NEUT%: 70.1 % (ref 38.4–76.8)
PLATELETS: 378 10*3/uL (ref 145–400)
RBC: 4.17 10*6/uL (ref 3.70–5.45)
RDW: 14.7 % — AB (ref 11.2–14.5)
Retic %: 1.65 % (ref 0.70–2.10)
Retic Ct Abs: 68.81 10*3/uL (ref 33.70–90.70)
WBC: 11.2 10*3/uL — AB (ref 3.9–10.3)
lymph#: 2.2 10*3/uL (ref 0.9–3.3)

## 2017-01-12 LAB — COMPREHENSIVE METABOLIC PANEL
ALBUMIN: 3.1 g/dL — AB (ref 3.5–5.0)
ALK PHOS: 90 U/L (ref 40–150)
ALT: 7 U/L (ref 0–55)
ANION GAP: 10 meq/L (ref 3–11)
AST: 14 U/L (ref 5–34)
BUN: 17.3 mg/dL (ref 7.0–26.0)
CALCIUM: 9.1 mg/dL (ref 8.4–10.4)
CHLORIDE: 102 meq/L (ref 98–109)
CO2: 29 mEq/L (ref 22–29)
CREATININE: 1.6 mg/dL — AB (ref 0.6–1.1)
EGFR: 33 mL/min/{1.73_m2} — ABNORMAL LOW (ref >60)
Glucose: 58 mg/dl — ABNORMAL LOW (ref 70–140)
POTASSIUM: 2.8 meq/L — AB (ref 3.5–5.1)
Sodium: 141 mEq/L (ref 136–145)
Total Bilirubin: 0.39 mg/dL (ref 0.20–1.20)
Total Protein: 7.3 g/dL (ref 6.4–8.3)

## 2017-01-12 LAB — LACTATE DEHYDROGENASE: LDH: 242 U/L (ref 125–245)

## 2017-01-12 MED ORDER — POTASSIUM CHLORIDE CRYS ER 20 MEQ PO TBCR: 40.0000 meq | EXTENDED_RELEASE_TABLET | Freq: Every day | ORAL | 0 refills | Status: DC

## 2017-01-12 NOTE — Telephone Encounter (Signed)
Tc made to the pt to schedule an appt. Appt has been scheduled for the pt to see Dr. Irene Limbo on 11/6 at 11am. Pt aware to arrive 15-30 minutes early.

## 2017-01-12 NOTE — Progress Notes (Signed)
HEMATOLOGY/ONCOLOGY CONSULTATION NOTE  Date of Service: 01/12/2017  Patient Care Team: Olin Hauser, MD as PCP - General (Family Medicine)  CHIEF COMPLAINTS/PURPOSE OF CONSULTATION:  Undetermined left rib lesion  HISTORY OF PRESENTING ILLNESS:   Tara Serrano is a wonderful 71 y.o. female who has been referred to [place] by Dr Claiborne Billings, Gwyndolyn Saxon, MD for evaluation and management of left rib lesions seen on recent CT A/P.   Initially, the patient presented into the ED on 11/06/16 complaining of a one week history of upper abdominal pain with associated nausea, poor appetite. During her visit to the ED she was found to have gallstones, distended gallbladder and dilated CBD on CT A/P. While in the ED she also became increasingly hypoxic and ABG was found to have significant CO2 retention. She was admitted to ICU at that time for workup with sepsis due to cholangitis.   During her admission she underwent a cholecystectomy and Zosyn therapy. Her cultures were without growth. She was discharged with Augmentin. Incidentally, on her CT she was found to have 66mm right lower lobe pulmonary nodule, 85mm cystic mass on tail of the pancreas, retroperitoneal lymphadenopathy, 3.7cm left fifth rib lesion, and sub-centimeter T1 hyperintense renal cortical lesions. She was discharged on 11/12/16 from her admission. Surgical pathology from her cholecystectomy showed no evidence of malignancy within the gallbladder.   On 12/10/16 she underwent PET/CT with results as demonstrated: Right lower lobe pulmonary nodule is not hypermetabolic. Recommend followup noncontrast chest CT in 6 months. Left fifth anterior rib lesion appears to be a chondroid lesion with low metabolic activity. 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 Biopsy versus close surveillance. Cystic lesion involving the right thyroid lobe is not hypermetabolic. Ultrasound follow-up suggested. She was subsequently referred into medical oncology for  further management of her disease.   Prior to her admission, she reports that she noticed approximately one year ago she was sick with a UTI with similar symptoms to her admission. She was placed on antibiotics which resolved her symptoms mostly, however, following this her symptoms had returned shortly after and she has not felt normal since. She also reports that she had lost approximately 25lbs throughout this time as well.   Since her surgery, she has been healing well. She reports that she has been experiencing some diarrhea, however, this has been manageable. Otherwise she has been feeling well. She has been keeping her fluid intake up throughout this and she has not felt dehydrated. She did try taking Imodium for this which helped, however, she stopped this because of side effects. She denies any abdominal cramping during her diarrhea, but she has noticed some drops of blood that she believes to be related to hemorrhoids. She has not recently had a mammogram. Her last colonoscopy was 2-3 years ago. She is now feeling mostly at her baseline otherwise and has otherwise been asymptomatic. She denies any pain specifically within her chest wall or palpable masses along the chest wall.   On review of systems, pt denies fever, chills, rash, mouth sores, weight loss, decreased appetite, urinary complaints. Denies pain. Pt denies abdominal pain, nausea, vomiting. She does reports ongoing diarrhea since her cholecystectomy. She also has chronic leg swelling which has been much improved lately since her admission.   MEDICAL HISTORY:  Past Medical History:  Diagnosis Date  . Acute cholecystitis 11/06/2016  . Atrial fibrillation ([place])   . Cholangitis   . Diabetes mellitus without complication ([place])   . Elevated LFTs   . GERD (gastroesophageal reflux  disease)   . Hypertension   . Pulmonary nodule   . Rib lesion     SURGICAL HISTORY: History reviewed. No pertinent surgical history.  SOCIAL  HISTORY: Social History   Socioeconomic History  . Marital status: Married    Spouse name: Not on file  . Number of children: Not on file  . Years of education: Not on file  . Highest education level: Not on file  Social Needs  . Financial resource strain: Not on file  . Food insecurity - worry: Not on file  . Food insecurity - inability: Not on file  . Transportation needs - medical: Not on file  . Transportation needs - non-medical: Not on file  Occupational History  . Not on file  Tobacco Use  . Smoking status: Never Smoker  . Smokeless tobacco: Never Used  Substance and Sexual Activity  . Alcohol use: No  . Drug use: No  . Sexual activity: Not on file  Other Topics Concern  . Not on file  Social History Narrative   Lives with spouse in [place].    FAMILY HISTORY: Family History  Problem Relation Age of Onset  . Stroke Mother   . Diabetes Mother   . Hypertension Mother   . Chronic Renal Failure Father   . Cancer Brother        esophageal  . Melanoma Brother     ALLERGIES:  is allergic to statins.  MEDICATIONS:  Current Outpatient Medications  Medication Sig Dispense Refill  . apixaban (ELIQUIS) 5 MG TABS tablet Take 1 tablet (5 mg total) by mouth 2 (two) times daily. 60 tablet 5  . carvedilol (COREG) 25 MG tablet Take 1 tablet (25 mg total) by mouth 2 (two) times daily with a meal. 60 tablet 5  . glimepiride (AMARYL) 4 MG tablet Take 8 mg by mouth daily before breakfast.    . NIFEdipine (ADALAT CC) 90 MG 24 hr tablet Take 90 mg by mouth daily.    . RABEprazole (ACIPHEX) 20 MG tablet Take 20 mg by mouth 2 (two) times daily.     . furosemide (LASIX) 20 MG tablet Take 1 tablet (20 mg total) by mouth daily. 30 tablet 5  . lisinopril (PRINIVIL,ZESTRIL) 2.5 MG tablet Take 1 tablet (2.5 mg total) by mouth daily. 30 tablet 5  . potassium chloride (K-DUR) 10 MEQ tablet Take 2 tablets (20 mEq total) by mouth daily. 60 tablet 5   No current facility-administered  medications for this visit.     REVIEW OF SYSTEMS:    A 10+ POINT REVIEW OF SYSTEMS WAS OBTAINED including neurology, dermatology, psychiatry, cardiac, respiratory, lymph, extremities, GI, GU, Musculoskeletal, constitutional, breasts, reproductive, HEENT.  All pertinent positives are noted in the HPI.  All others are negative.  PHYSICAL EXAMINATION: ECOG PERFORMANCE STATUS: 1 - Symptomatic but completely ambulatory  . Vitals:   01/12/17 1054  BP: (!) 150/73  Pulse: 73  Resp: 20  Temp: 98.9 F (37.2 C)  SpO2: 98%   Filed Weights   01/12/17 1054  Weight: 177 lb 11.2 oz (80.6 kg)   .Body mass index is 34.7 kg/m.  GENERAL:alert, in no acute distress and comfortable SKIN: no acute rashes, no significant lesions EYES: conjunctiva are pink and non-injected, sclera anicteric OROPHARYNX: MMM, no exudates, no oropharyngeal erythema or ulceration NECK: supple, no JVD. Palpable cyst is felt within the right neck.  LYMPH:  no palpable lymphadenopathy in the cervical, axillary or inguinal regions LUNGS: clear to auscultation b/l with  normal respiratory effort CHEST: She has a palpable mass on the left midchest anteriorly.  HEART: regular rate & rhythm ABDOMEN:  normoactive bowel sounds, non tender, not distended. Well healed scars from recent laparoscopic cholecystectomy. Extremity: 2+ pedal edema bilaterally  PSYCH: alert & oriented x 3 with fluent speech NEURO: no focal motor/sensory deficits  LABORATORY DATA:  I have reviewed the data as listed  . CBC Latest Ref Rng & Units 01/12/2017 11/12/2016 11/11/2016  WBC 3.9 - 10.3 10e3/uL 11.2(H) 11.6(H) 11.0(H)  Hemoglobin 11.6 - 15.9 g/dL 12.0 11.6(L) 11.9(L)  Hematocrit 34.8 - 46.6 % 37.0 38.2 38.6  Platelets 145 - 400 10e3/uL 378 341 336    . CMP Latest Ref Rng & Units 01/12/2017 01/12/2017 11/12/2016  Glucose 70 - 140 mg/dl 58(L) - 108(H)  BUN 7.0 - 26.0 mg/dL 17.3 - 18  Creatinine 0.6 - 1.1 mg/dL 1.6(H) - 0.98  Sodium 136 - 145  mEq/L 141 - 139  Potassium 3.5 - 5.1 mEq/L 2.8(LL) - 3.4(L)  Chloride 101 - 111 mmol/L - - 101  CO2 22 - 29 mEq/L 29 - 30  Calcium 8.4 - 10.4 mg/dL 9.1 - 8.4(L)  Total Protein 6.4 - 8.3 g/dL 7.3 6.7 6.1(L)  Total Bilirubin 0.20 - 1.20 mg/dL 0.39 - 1.2  Alkaline Phos 40 - 150 U/L 90 - 266(H)  AST 5 - 34 U/L 14 - 36  ALT 0 - 55 U/L 7 - 42   . Lab Results  Component Value Date   LDH 242 01/12/2017       RADIOGRAPHIC STUDIES: I have personally reviewed the radiological images as listed and agreed with the findings in the report. No results found.   PET/CT 12/10/2016: IMPRESSION: 1. Right lower lobe pulmonary nodule is not hypermetabolic. Recommend followup noncontrast chest CT in 6 months. 2. Left fifth anterior rib lesion appears to be a chondroid lesion with low metabolic activity. 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 Biopsy versus close surveillance. 3. Cystic lesion involving the right thyroid lobe is not hypermetabolic. Ultrasound follow-up suggested. 4. Hypermetabolism in the liver around the gallbladder fossa likely from recent surgery. 5. Diffuse GI uptake, likely physiologic.   Electronically Signed   By: Marijo Sanes M.D.   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  Multiple small less than 1 cm cysts centered in the renal medulla bilaterally. Nonspecific could be related to congenital cystic kidney disease, medullary cystic kidney disease or medullary sponge kidney. No associated calcifications.  1.6 cm left adrenal adenoma.  Sigmoid diverticulosis, without diverticulitis.  Cholelithiasis.     US thyroid 01/23/2017: IMPRESSION: 1. No significant change in a predominantly cystic nodule in the right thyroid lobe measuring 3.9 cm.   ASSESSMENT & PLAN:  This is a  wonderful 71 y.o. female who presents today to discuss:   1) Indeterminate left rib lesion -I discussed the PET/CT findings with patient in great detail today. Radiology felt that this was possibly a chondroid lesion, enchondroma versus low-grade chondrosarcoma could not be ruled out. No other bony sites of pathology were identified.   2) Mesenteric and retroperitoneal lymph nodes and diffuse uptake within the GI tract -- appear reactive ? Inflammation.  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  PLAN -PET/CT reviewed with the patient. -Myeloma panel and SFLC done and show no overt evidence of monoclonal paraproteinemia to suggest Myeloma. -If her diarrhea continues she will need further evaluation with stool studies and possible colonoscopy as per her PCP. -I informed her that we could pursue an image guided biopsy the area of concern within her bone, or we could be only perform surveillance over the next several months.  -I recommended her that since her bony tumor was concerning in its presentation and is relatively new based on previous scans, that I would recommend getting a biopsy to establish a tissue diagnosis. She is agreeable with this.    -We will monitor her other areas of concern within the lung with serial scans as well.  -I did inform her that we will have her hold her Eliquis for two days prior to her biopsy.    Labs today CT guided biopsy of the left chest wall (5th rib mass) in 5-7 days RTC with Dr Irene Limbo in 2 weeks   All of the patients questions were answered with apparent satisfaction. The patient knows to call the clinic with any problems, questions or concerns.  I spent 50 minutes counseling the patient face to face. The total time spent in the appointment was  60 minutes and more than 50% was on counseling and direct patient cares.   .I have reviewed the above documentation for accuracy and completeness, and I agree with the above.  Sullivan Lone MD [place] AAHIVMS [facility] Hematology/Oncology Physician [facility]  (Office):       [phone] (Work cell):  [phone] (Fax):           [phone]  01/12/2017 11:59 AM  This document serves as a record of services personally performed by Sullivan Lone, MD. It was created on his behalf by Reola Mosher, a trained medical scribe. The creation of this record is based on the scribe's personal observations and the provider's statements to them. This document has been checked and approved by the attending provider.

## 2017-01-12 NOTE — Telephone Encounter (Signed)
Pt was contacted in regards to the new potassium chloride 40Meq prescription.  Pt stated that potassium makes "her stomach hurt" and she doesn't like to take them.  RN advised her to take the 40Meq daily for 14 days as prescribed due to her potassium level being 2.8.  Will discuss with Dr. Irene Limbo tomorrow.

## 2017-01-12 NOTE — Telephone Encounter (Signed)
Gave avs patient declined calendar for November

## 2017-01-13 ENCOUNTER — Other Ambulatory Visit: Payer: Self-pay

## 2017-01-13 LAB — KAPPA/LAMBDA LIGHT CHAINS
Ig Kappa Free Light Chain: 81.3 mg/L — ABNORMAL HIGH (ref 3.3–19.4)
Ig Lambda Free Light Chain: 60.3 mg/L — ABNORMAL HIGH (ref 5.7–26.3)
Kappa/Lambda FluidC Ratio: 1.35 (ref 0.26–1.65)

## 2017-01-14 ENCOUNTER — Telehealth: Payer: Self-pay

## 2017-01-14 NOTE — Telephone Encounter (Signed)
Pt had a visit at [facility] this morning with an APP. Requested to leave VM with appropriate CMA, Network engineer, or nurse. Left VM explaining that K 2.8 11/7. Pt instructed to take 63mEq daily for 14 days. Pt has left over potassium at home that is 82mEq. Explained the difference to the pt. Complaint yesterday from patient that this medication causes nausea. Responded that a low potassium can cause cardiac arrhythmias, and that she should do her best to take the potassium to avoid unwanted side effects. Left VM with Dr. Evette Georges secretary to inform of pt communication and recommendation to f/u with PCP in two weeks with lab work.  Fax receipt of lab work confirmed at 1351.

## 2017-01-15 LAB — MULTIPLE MYELOMA PANEL, SERUM
Albumin SerPl Elph-Mcnc: 3.1 g/dL (ref 2.9–4.4)
Albumin/Glob SerPl: 0.9 (ref 0.7–1.7)
Alpha 1: 0.3 g/dL (ref 0.0–0.4)
Alpha2 Glob SerPl Elph-Mcnc: 1 g/dL (ref 0.4–1.0)
B-Globulin SerPl Elph-Mcnc: 1.1 g/dL (ref 0.7–1.3)
Gamma Glob SerPl Elph-Mcnc: 1.1 g/dL (ref 0.4–1.8)
Globulin, Total: 3.6 g/dL (ref 2.2–3.9)
IgA, Qn, Serum: 292 mg/dL (ref 64–422)
IgG, Qn, Serum: 1197 mg/dL (ref 700–1600)
IgM, Qn, Serum: 94 mg/dL (ref 26–217)
Total Protein: 6.7 g/dL (ref 6.0–8.5)

## 2017-01-18 ENCOUNTER — Other Ambulatory Visit: Payer: Self-pay | Admitting: Hematology

## 2017-01-18 DIAGNOSIS — M899 Disorder of bone, unspecified: Secondary | ICD-10-CM

## 2017-01-21 ENCOUNTER — Other Ambulatory Visit: Payer: Self-pay | Admitting: Hematology

## 2017-01-21 ENCOUNTER — Telehealth: Payer: Self-pay | Admitting: Pharmacist

## 2017-01-21 ENCOUNTER — Telehealth: Payer: Self-pay | Admitting: *Deleted

## 2017-01-21 ENCOUNTER — Other Ambulatory Visit: Payer: Self-pay | Admitting: *Deleted

## 2017-01-21 ENCOUNTER — Encounter: Payer: Self-pay | Admitting: *Deleted

## 2017-01-21 ENCOUNTER — Ambulatory Visit: Payer: Medicare Other

## 2017-01-21 ENCOUNTER — Other Ambulatory Visit (HOSPITAL_COMMUNITY)
Admission: AD | Admit: 2017-01-21 | Discharge: 2017-01-21 | Disposition: A | Discharge status: Home / Self Care | Payer: Medicare Other | Source: Ambulatory Visit | Attending: Hematology | Admitting: Hematology

## 2017-01-21 DIAGNOSIS — R197 Diarrhea, unspecified: Secondary | ICD-10-CM

## 2017-01-21 DIAGNOSIS — R222 Localized swelling, mass and lump, trunk: Secondary | ICD-10-CM | POA: Insufficient documentation

## 2017-01-21 LAB — GASTROINTESTINAL PANEL BY PCR, STOOL (REPLACES STOOL CULTURE)
Adenovirus F40/41: NOT DETECTED
Astrovirus: NOT DETECTED
CRYPTOSPORIDIUM: NOT DETECTED
CYCLOSPORA CAYETANENSIS: NOT DETECTED
Campylobacter species: NOT DETECTED
ENTAMOEBA HISTOLYTICA: NOT DETECTED
Enteroaggregative E coli (EAEC): NOT DETECTED
Enteropathogenic E coli (EPEC): NOT DETECTED
Enterotoxigenic E coli (ETEC): NOT DETECTED
Giardia lamblia: NOT DETECTED
Norovirus GI/GII: NOT DETECTED
Plesimonas shigelloides: NOT DETECTED
Rotavirus A: NOT DETECTED
SALMONELLA SPECIES: NOT DETECTED
SAPOVIRUS (I, II, IV, AND V): DETECTED — AB
SHIGA LIKE TOXIN PRODUCING E COLI (STEC): NOT DETECTED
SHIGELLA/ENTEROINVASIVE E COLI (EIEC): NOT DETECTED
VIBRIO CHOLERAE: NOT DETECTED
VIBRIO SPECIES: NOT DETECTED
YERSINIA ENTEROCOLITICA: NOT DETECTED

## 2017-01-21 LAB — C DIFFICILE QUICK SCREEN W PCR REFLEX
C DIFFICLE (CDIFF) ANTIGEN: POSITIVE — AB
C Diff interpretation: DETECTED
C Diff toxin: POSITIVE — AB

## 2017-01-21 MED ORDER — VANCOMYCIN HCL 250 MG PO CAPS: 250.0000 mg | ORAL_CAPSULE | Freq: Four times a day (QID) | ORAL | 0 refills | Status: AC

## 2017-01-21 MED ORDER — LACTINEX PO CHEW: 1.0000 | CHEWABLE_TABLET | Freq: Three times a day (TID) | ORAL | 0 refills | Status: AC

## 2017-01-21 NOTE — Progress Notes (Signed)
Positive C Diff to Delle Reining, RN at 1005 by DCM

## 2017-01-21 NOTE — Telephone Encounter (Signed)
Pt takes Eliquis for afib with CHADS2VASc score of 4 (age, sex, DM, HTN). SCr has increased on most recent BMET however pt still on correct dose of Eliquis. Ok to hold for 2 days prior to procedure as requested. Clearance routed.

## 2017-01-21 NOTE — Telephone Encounter (Signed)
-----   Message from Roosvelt Maser sent at 01/21/2017  7:39 AM EST ----- Regarding: RE: ELIQUIS Dr. Irene Limbo at the cancer center is ordering her to have a ct bx of lt rib mass  ----- Message ----- From: Leeroy Bock, [facility] Sent: 01/19/2017   4:38 PM To: Roosvelt Maser Subject: RE: Arne Cleveland                                    What is the procedure being done? Need details in order to provide anticoagulation clearance.  Thanks, Jinny Blossom, PharmD  ----- Message ----- From: Charlynn Grimes Sent: 01/19/2017   3:41 PM To: Leeroy Bock, RPH Subject: FWArne Cleveland                                      ----- Message ----- From: Roosvelt Maser Sent: 01/19/2017  12:49 PM To: Brunetta Genera, MD, # Subject: Arne Cleveland                                         Dr. Carron Curie,  Patient will need to hold her Eliquis 48 hours prior to having this procedure.  Can she hold this bloodthinner?  Thank you, [facility] Radiology scheduler

## 2017-01-21 NOTE — Telephone Encounter (Signed)
Received message from lab that stool sample positive for C-diff.  Rx for vanc and probiotic sent to pt's pharmacy per Dr. Irene Limbo.  Pt informed.  Pt verbalized understanding/will pick up rx today.

## 2017-01-21 NOTE — Telephone Encounter (Signed)
Per Dr. Irene Limbo, most recent stool sample positive for sapovirus.  Contacted pt to inform her.  No change of plan.  Pt instructed to contact us if diarrhea becomes severe/pt becomes dehydrated.  Pt instructed to wash hands with warm soap/water after every bathroom visit. Pt verbalized understanding.

## 2017-01-25 NOTE — Progress Notes (Signed)
HEMATOLOGY/ONCOLOGY CONSULTATION NOTE  Date of Service: 01/26/2017  Patient Care Team: Tara Hauser, MD as PCP - General (Family Medicine)  CHIEF COMPLAINTS/PURPOSE OF CONSULTATION:  Undetermined left rib lesion  HISTORY OF PRESENTING ILLNESS:   Tara Serrano is a wonderful 71 y.o. female who has been referred to [place] by Dr Claiborne Billings, Gwyndolyn Saxon, MD for evaluation and management of left rib lesions seen on recent CT A/P.   Initially, the patient presented into the ED on 11/06/16 complaining of a one week history of upper abdominal pain with associated nausea, poor appetite. During her visit to the ED she was found to have gallstones, distended gallbladder and dilated CBD on CT A/P. While in the ED she also became increasingly hypoxic and ABG was found to have significant CO2 retention. She was admitted to ICU at that time for workup with sepsis due to cholangitis.   During her admission she underwent a cholecystectomy and Zosyn therapy. Her cultures were without growth. She was discharged with Augmentin. Incidentally, on her CT she was found to have 24mm right lower lobe pulmonary nodule, 1mm cystic mass on tail of the pancreas, retroperitoneal lymphadenopathy, 3.7cm left fifth rib lesion, and sub-centimeter T1 hyperintense renal cortical lesions. She was discharged on 11/12/16 from her admission. Surgical pathology from her cholecystectomy showed no evidence of malignancy within the gallbladder.   On 12/10/16 she underwent PET/CT with results as demonstrated: Right lower lobe pulmonary nodule is not hypermetabolic. Recommend followup noncontrast chest CT in 6 months. Left fifth anterior rib lesion appears to be a chondroid lesion with low metabolic activity. 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 Biopsy versus close surveillance. Cystic lesion involving the right thyroid lobe is not hypermetabolic. Ultrasound follow-up suggested. She was subsequently referred into medical oncology for  further management of her disease.   Prior to her admission, she reports that she noticed approximately one year ago she was sick with a UTI with similar symptoms to her admission. She was placed on antibiotics which resolved her symptoms mostly, however, following this her symptoms had returned shortly after and she has not felt normal since. She also reports that she had lost approximately 25lbs throughout this time as well.   Since her surgery, she has been healing well. She reports that she has been experiencing some diarrhea, however, this has been manageable. Otherwise she has been feeling well. She has been keeping her fluid intake up throughout this and she has not felt dehydrated. She did try taking Imodium for this which helped, however, she stopped this because of side effects. She denies any abdominal cramping during her diarrhea, but she has noticed some drops of blood that she believes to be related to hemorrhoids. She has not recently had a mammogram. Her last colonoscopy was 2-3 years ago. She is now feeling mostly at her baseline otherwise and has otherwise been asymptomatic. She denies any pain specifically within her chest wall or palpable masses along the chest wall.   On review of systems, pt denies fever, chills, rash, mouth sores, weight loss, decreased appetite, urinary complaints. Denies pain. Pt denies abdominal pain, nausea, vomiting. She does reports ongoing diarrhea since her cholecystectomy. She also has chronic leg swelling which has been much improved lately since her admission.    INTERVAL HISTORY:   Tara Serrano is here for a follow up of her left rib lesion and diarrhea. She notes her biopsy is next week and this could not be scheduled prior to this f/u as per plan. The diarrhea  she had was related to Sapovirus and C-Diff. C-diff was likely triggered by her use of previous antibiotics. She is being treated with oral Vancomycin. She is also taking probiotic. She now has  about 2 bowel movements a day. She had mentioned her diarrhea to several physicians and now after proper treatment her diarrhea has resolved. She has well water at home but knows there is a filtering system on it.  No fevers. Abdominal symptoms and diarrhea resolved and she is very glad about this.   MEDICAL HISTORY:  Past Medical History:  Diagnosis Date  . Acute cholecystitis 11/06/2016  . Atrial fibrillation ([place])   . Cholangitis   . Diabetes mellitus without complication ([place])   . Elevated LFTs   . GERD (gastroesophageal reflux disease)   . Hypertension   . Pulmonary nodule   . Rib lesion     SURGICAL HISTORY: Past Surgical History:  Procedure Laterality Date  . CHOLECYSTECTOMY N/A 11/10/2016   Procedure: LAPAROSCOPIC CHOLECYSTECTOMY WITH INTRAOPERATIVE CHOLANGIOGRAM;  Surgeon: Stark Klein, MD;  Location: [facility];  Service: General;  Laterality: N/A;    SOCIAL HISTORY: Social History   Socioeconomic History  . Marital status: Married    Spouse name: Not on file  . Number of children: Not on file  . Years of education: Not on file  . Highest education level: Not on file  Social Needs  . Financial resource strain: Not on file  . Food insecurity - worry: Not on file  . Food insecurity - inability: Not on file  . Transportation needs - medical: Not on file  . Transportation needs - non-medical: Not on file  Occupational History  . Not on file  Tobacco Use  . Smoking status: Never Smoker  . Smokeless tobacco: Never Used  Substance and Sexual Activity  . Alcohol use: No  . Drug use: No  . Sexual activity: Not on file  Other Topics Concern  . Not on file  Social History Narrative   Lives with spouse in [place].    FAMILY HISTORY: Family History  Problem Relation Age of Onset  . Stroke Mother   . Diabetes Mother   . Hypertension Mother   . Chronic Renal Failure Father   . Cancer Brother        esophageal  . Melanoma Brother     ALLERGIES:  is allergic to  statins.  MEDICATIONS:  Current Outpatient Medications  Medication Sig Dispense Refill  . apixaban (ELIQUIS) 5 MG TABS tablet Take 1 tablet (5 mg total) by mouth 2 (two) times daily. 60 tablet 5  . carvedilol (COREG) 25 MG tablet Take 1 tablet (25 mg total) by mouth 2 (two) times daily with a meal. 60 tablet 5  . glimepiride (AMARYL) 4 MG tablet Take 8 mg by mouth daily before breakfast.    . lactobacillus acidophilus & bulgar (LACTINEX) chewable tablet Chew 1 tablet 3 (three) times daily with meals for 20 days by mouth. 60 tablet 0  . NIFEdipine (ADALAT CC) 90 MG 24 hr tablet Take 90 mg by mouth daily.    . potassium chloride SA (K-DUR,KLOR-CON) 20 MEQ tablet Take 2 tablets (40 mEq total) daily for 14 days by mouth. 28 tablet 0  . RABEprazole (ACIPHEX) 20 MG tablet Take 20 mg by mouth 2 (two) times daily.     . vancomycin (VANCOCIN) 250 MG capsule Take 1 capsule (250 mg total) 4 (four) times daily for 14 days by mouth. 56 capsule 0  . furosemide (  LASIX) 20 MG tablet Take 1 tablet (20 mg total) by mouth daily. 30 tablet 5  . lisinopril (PRINIVIL,ZESTRIL) 2.5 MG tablet Take 1 tablet (2.5 mg total) by mouth daily. 30 tablet 5   No current facility-administered medications for this visit.     REVIEW OF SYSTEMS:    A 10+ POINT REVIEW OF SYSTEMS WAS OBTAINED including neurology, dermatology, psychiatry, cardiac, respiratory, lymph, extremities, GI, GU, Musculoskeletal, constitutional, breasts, reproductive, HEENT.  All pertinent positives are noted in the HPI.  All others are negative.  PHYSICAL EXAMINATION: ECOG PERFORMANCE STATUS: 1 - Symptomatic but completely ambulatory  . Vitals:   01/26/17 1421  BP: (!) 147/53  Pulse: 67  Resp: 17  Temp: 97.7 F (36.5 C)  SpO2: 98%   Filed Weights   01/26/17 1421  Weight: 182 lb 1.6 oz (82.6 kg)   .Body mass index is 35.56 kg/m.  GENERAL:alert, in no acute distress and comfortable SKIN: no acute rashes, no significant lesions EYES:  conjunctiva are pink and non-injected, sclera anicteric OROPHARYNX: MMM, no exudates, no oropharyngeal erythema or ulceration NECK: supple, no JVD. Palpable cyst is felt within the right neck.  LYMPH:  no palpable lymphadenopathy in the cervical, axillary or inguinal regions LUNGS: clear to auscultation b/l with normal respiratory effort CHEST: She has a palpable mass on the left midchest anteriorly.  HEART: regular rate & rhythm ABDOMEN:  normoactive bowel sounds, non tender, not distended. Well healed scars from recent laparoscopic cholecystectomy. Extremity: 2+ pedal edema bilaterally  PSYCH: alert & oriented x 3 with fluent speech NEURO: no focal motor/sensory deficits  LABORATORY DATA:  I have reviewed the data as listed  . CBC Latest Ref Rng & Units 01/12/2017 11/12/2016 11/11/2016  WBC 3.9 - 10.3 10e3/uL 11.2(H) 11.6(H) 11.0(H)  Hemoglobin 11.6 - 15.9 g/dL 12.0 11.6(L) 11.9(L)  Hematocrit 34.8 - 46.6 % 37.0 38.2 38.6  Platelets 145 - 400 10e3/uL 378 341 336    . CMP Latest Ref Rng & Units 01/12/2017 01/12/2017 11/12/2016  Glucose 70 - 140 mg/dl 58(L) - 108(H)  BUN 7.0 - 26.0 mg/dL 17.3 - 18  Creatinine 0.6 - 1.1 mg/dL 1.6(H) - 0.98  Sodium 136 - 145 mEq/L 141 - 139  Potassium 3.5 - 5.1 mEq/L 2.8(LL) - 3.4(L)  Chloride 101 - 111 mmol/L - - 101  CO2 22 - 29 mEq/L 29 - 30  Calcium 8.4 - 10.4 mg/dL 9.1 - 8.4(L)  Total Protein 6.4 - 8.3 g/dL 7.3 6.7 6.1(L)  Total Bilirubin 0.20 - 1.20 mg/dL 0.39 - 1.2  Alkaline Phos 40 - 150 U/L 90 - 266(H)  AST 5 - 34 U/L 14 - 36  ALT 0 - 55 U/L 7 - 42   . Lab Results  Component Value Date   LDH 242 01/12/2017       RADIOGRAPHIC STUDIES: I have personally reviewed the radiological images as listed and agreed with the findings in the report. No results found.   PET/CT 12/10/2016: IMPRESSION: 1. Right lower lobe pulmonary nodule is not hypermetabolic. Recommend followup noncontrast chest CT in 6 months. 2. Left fifth anterior rib lesion  appears to be a chondroid lesion with low metabolic activity. 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 Biopsy versus close surveillance. 3. Cystic lesion involving the right thyroid lobe is not hypermetabolic. Ultrasound follow-up suggested. 4. Hypermetabolism in the liver around the gallbladder fossa likely from recent surgery. 5. Diffuse GI uptake, likely physiologic.   Electronically Signed   By: Marijo Sanes M.D.   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  Multiple small less than 1 cm cysts centered in the renal medulla bilaterally. Nonspecific could be related to congenital cystic kidney disease, medullary cystic kidney disease or medullary sponge kidney. No associated calcifications.  1.6 cm left adrenal adenoma.  Sigmoid diverticulosis, without diverticulitis.  Cholelithiasis.     US thyroid 01/23/2017: IMPRESSION: 1. No significant change in a predominantly cystic nodule in the right thyroid lobe measuring 3.9 cm.   ASSESSMENT & PLAN:  This is a wonderful 71 y.o. female who presents today to discuss:   1) Indeterminate left rib lesion -I discussed the 12/10/16 PET/CT findings with patient in great detail today. Radiology felt that this was possibly a chondroid lesion, enchondroma versus low-grade chondrosarcoma could not be ruled out. No other bony sites of pathology were identified.   2) Mesenteric and retroperitoneal lymph nodes and diffuse uptake within the GI tract -- appear reactive ? Inflammation.  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  PLAN -Myeloma panel and SFLC  done and show no overt evidence of monoclonal paraproteinemia to suggest Myeloma. -CT abdomen and labs work up of her bowels showed evidence of Sapovirus and C-Difficile which was treated with vancomycin and probiotics. Diarrhea now resolved.  -I previously recommended her that since her bony tumor was concerning in its presentation and is relatively new based on previous scans, that I would recommend getting a biopsy to establish a tissue diagnosis. She is agreeable with this. Plans to have biopsy on 11/28 -We will monitor her other areas of concern with the lung with serial scans as well.  -I did inform her that we will have her hold her Eliquis for two days prior to her biopsy.   4) Hypokalemia - due to diarrhea was noted on previous visit. -Diarrhea now resolved. Patient was given potassium replacement that she is taking. -if scheduled for rpt labs with IR on 11/28   CT guided biopsy of the left chest wall (5th rib mass) on 11/28  RTC with Dr Irene Limbo in 2 weeks   All of the patients questions were answered with apparent satisfaction. The patient knows to call the clinic with any problems, questions or concerns.  I spent 20  minutes counseling the patient face to face. The total time spent in the appointment was 25 minutes and more than 50% was on counseling and direct patient cares.   Sullivan Lone MD [place] AAHIVMS [facility] Hematology/Oncology Physician [facility]  (Office):       [phone] (Work cell):  [phone] (Fax):           [phone]  01/26/2017 3:05 PM  This document serves as a record of services personally performed by Sullivan Lone, MD. It was created on his behalf by Joslyn Devon, a trained medical scribe. The creation of this record is based on the scribe's personal observations and the provider's statements to them.   .I have reviewed the above documentation for accuracy and completeness, and I agree with the above. Brunetta Genera MD MS

## 2017-01-26 ENCOUNTER — Encounter: Payer: Self-pay | Admitting: Hematology

## 2017-01-26 ENCOUNTER — Ambulatory Visit (HOSPITAL_BASED_OUTPATIENT_CLINIC_OR_DEPARTMENT_OTHER): Payer: Medicare Other | Admitting: Hematology

## 2017-01-26 ENCOUNTER — Telehealth: Payer: Self-pay | Admitting: Hematology

## 2017-01-26 VITALS — BP 147/53 | HR 67 | Temp 97.7°F | Resp 17 | Ht 60.0 in | Wt 182.1 lb

## 2017-01-26 DIAGNOSIS — R591 Generalized enlarged lymph nodes: Secondary | ICD-10-CM | POA: Diagnosis not present

## 2017-01-26 DIAGNOSIS — E876 Hypokalemia: Secondary | ICD-10-CM

## 2017-01-26 DIAGNOSIS — R222 Localized swelling, mass and lump, trunk: Secondary | ICD-10-CM | POA: Diagnosis present

## 2017-01-26 DIAGNOSIS — E041 Nontoxic single thyroid nodule: Secondary | ICD-10-CM

## 2017-01-26 DIAGNOSIS — A0831 Calicivirus enteritis: Secondary | ICD-10-CM

## 2017-01-26 DIAGNOSIS — R197 Diarrhea, unspecified: Secondary | ICD-10-CM

## 2017-01-26 DIAGNOSIS — A0472 Enterocolitis due to Clostridium difficile, not specified as recurrent: Secondary | ICD-10-CM

## 2017-01-26 NOTE — Telephone Encounter (Signed)
Scheduled appt per 11/20 los -per [facility] okay to overbook - patient is aware of appt date and time.

## 2017-01-29 ENCOUNTER — Other Ambulatory Visit: Payer: Self-pay | Admitting: Radiology

## 2017-02-03 ENCOUNTER — Ambulatory Visit (HOSPITAL_COMMUNITY)
Admission: RE | Admit: 2017-02-03 | Discharge: 2017-02-03 | Disposition: A | Discharge status: Home / Self Care | Payer: Medicare Other | Source: Ambulatory Visit | Attending: Hematology | Admitting: Hematology

## 2017-02-03 ENCOUNTER — Encounter (HOSPITAL_COMMUNITY): Payer: Self-pay

## 2017-02-03 DIAGNOSIS — Z9049 Acquired absence of other specified parts of digestive tract: Secondary | ICD-10-CM | POA: Diagnosis not present

## 2017-02-03 DIAGNOSIS — K8309 Other cholangitis: Secondary | ICD-10-CM | POA: Diagnosis not present

## 2017-02-03 DIAGNOSIS — Z7901 Long term (current) use of anticoagulants: Secondary | ICD-10-CM | POA: Insufficient documentation

## 2017-02-03 DIAGNOSIS — Z808 Family history of malignant neoplasm of other organs or systems: Secondary | ICD-10-CM | POA: Insufficient documentation

## 2017-02-03 DIAGNOSIS — Z8619 Personal history of other infectious and parasitic diseases: Secondary | ICD-10-CM | POA: Diagnosis not present

## 2017-02-03 DIAGNOSIS — Z841 Family history of disorders of kidney and ureter: Secondary | ICD-10-CM | POA: Diagnosis not present

## 2017-02-03 DIAGNOSIS — I4891 Unspecified atrial fibrillation: Secondary | ICD-10-CM | POA: Diagnosis not present

## 2017-02-03 DIAGNOSIS — Z833 Family history of diabetes mellitus: Secondary | ICD-10-CM | POA: Diagnosis not present

## 2017-02-03 DIAGNOSIS — Z79899 Other long term (current) drug therapy: Secondary | ICD-10-CM | POA: Insufficient documentation

## 2017-02-03 DIAGNOSIS — K219 Gastro-esophageal reflux disease without esophagitis: Secondary | ICD-10-CM | POA: Insufficient documentation

## 2017-02-03 DIAGNOSIS — I10 Essential (primary) hypertension: Secondary | ICD-10-CM | POA: Diagnosis not present

## 2017-02-03 DIAGNOSIS — Z8249 Family history of ischemic heart disease and other diseases of the circulatory system: Secondary | ICD-10-CM | POA: Insufficient documentation

## 2017-02-03 DIAGNOSIS — Z823 Family history of stroke: Secondary | ICD-10-CM | POA: Insufficient documentation

## 2017-02-03 DIAGNOSIS — Z7984 Long term (current) use of oral hypoglycemic drugs: Secondary | ICD-10-CM | POA: Diagnosis not present

## 2017-02-03 DIAGNOSIS — E119 Type 2 diabetes mellitus without complications: Secondary | ICD-10-CM | POA: Insufficient documentation

## 2017-02-03 DIAGNOSIS — Z800 Family history of malignant neoplasm of digestive organs: Secondary | ICD-10-CM | POA: Diagnosis not present

## 2017-02-03 DIAGNOSIS — M899 Disorder of bone, unspecified: Secondary | ICD-10-CM | POA: Insufficient documentation

## 2017-02-03 LAB — CBC WITH DIFFERENTIAL/PLATELET
BASOS ABS: 0 10*3/uL (ref 0.0–0.1)
BASOS PCT: 0 %
Eosinophils Absolute: 0.2 10*3/uL (ref 0.0–0.7)
Eosinophils Relative: 2 %
HEMATOCRIT: 36.9 % (ref 36.0–46.0)
HEMOGLOBIN: 12 g/dL (ref 12.0–15.0)
Lymphocytes Relative: 15 %
Lymphs Abs: 1.7 10*3/uL (ref 0.7–4.0)
MCH: 29.1 pg (ref 26.0–34.0)
MCHC: 32.5 g/dL (ref 30.0–36.0)
MCV: 89.3 fL (ref 78.0–100.0)
Monocytes Absolute: 0.6 10*3/uL (ref 0.1–1.0)
Monocytes Relative: 5 %
NEUTROS ABS: 8.7 10*3/uL — AB (ref 1.7–7.7)
NEUTROS PCT: 78 %
Platelets: 400 10*3/uL (ref 150–400)
RBC: 4.13 MIL/uL (ref 3.87–5.11)
RDW: 14.2 % (ref 11.5–15.5)
WBC: 11.2 10*3/uL — ABNORMAL HIGH (ref 4.0–10.5)

## 2017-02-03 LAB — BASIC METABOLIC PANEL
ANION GAP: 10 (ref 5–15)
BUN: 26 mg/dL — ABNORMAL HIGH (ref 6–20)
CALCIUM: 9.3 mg/dL (ref 8.9–10.3)
CO2: 27 mmol/L (ref 22–32)
Chloride: 102 mmol/L (ref 101–111)
Creatinine, Ser: 1.08 mg/dL — ABNORMAL HIGH (ref 0.44–1.00)
GFR, EST AFRICAN AMERICAN: 58 mL/min — AB (ref >60)
GFR, EST NON AFRICAN AMERICAN: 50 mL/min — AB (ref >60)
Glucose, Bld: 109 mg/dL — ABNORMAL HIGH (ref 65–99)
POTASSIUM: 3.6 mmol/L (ref 3.5–5.1)
Sodium: 139 mmol/L (ref 135–145)

## 2017-02-03 LAB — PROTIME-INR
INR: 0.99
Prothrombin Time: 13 seconds (ref 11.4–15.2)

## 2017-02-03 LAB — GLUCOSE, CAPILLARY: GLUCOSE-CAPILLARY: 105 mg/dL — AB (ref 65–99)

## 2017-02-03 MED ORDER — MIDAZOLAM HCL 2 MG/2ML IJ SOLN
INTRAMUSCULAR | Status: AC
  Filled 2017-02-03: qty 2

## 2017-02-03 MED ORDER — FENTANYL CITRATE (PF) 100 MCG/2ML IJ SOLN
INTRAMUSCULAR | Status: AC | PRN
  Administered 2017-02-03 (×2): 50 ug via INTRAVENOUS

## 2017-02-03 MED ORDER — LIDOCAINE HCL (PF) 1 % IJ SOLN
INTRAMUSCULAR | Status: AC | PRN
  Administered 2017-02-03: 20 mL

## 2017-02-03 MED ORDER — FENTANYL CITRATE (PF) 100 MCG/2ML IJ SOLN
INTRAMUSCULAR | Status: AC
  Filled 2017-02-03: qty 2

## 2017-02-03 MED ORDER — HYDROCODONE-ACETAMINOPHEN 5-325 MG PO TABS: 1.0000 | ORAL_TABLET | ORAL | Status: DC | PRN

## 2017-02-03 MED ORDER — SODIUM CHLORIDE 0.9 % IV SOLN
INTRAVENOUS | Status: DC
  Administered 2017-02-03: 07:00:00 via INTRAVENOUS

## 2017-02-03 MED ORDER — MIDAZOLAM HCL 2 MG/2ML IJ SOLN
INTRAMUSCULAR | Status: AC | PRN
  Administered 2017-02-03 (×2): 1 mg via INTRAVENOUS

## 2017-02-03 NOTE — Procedures (Signed)
CT guided core biopsy and aspiration of left 5th rib lesion.   Minimal blood loss and no immediate complication.

## 2017-02-03 NOTE — Sedation Documentation (Signed)
Pt flipped into atrial fib flutter. Rate controlled and B/P unchanged. MD aware.

## 2017-02-03 NOTE — Consult Note (Signed)
Chief Complaint: Patient was seen in consultation today for CT guided biopsy of left fifth rib expansile mass  Referring Physician(s): Brunetta Genera  Supervising Physician: Markus Daft  Patient Status: [facility] - Out-pt  History of Present Illness: Tara Serrano is a 70 y.o. female with past medical history as listed below and recent imaging which has revealed a left fifth anterior rib lesion, possibly chondroid lesion (endochondroma versus low-grade chondrosarcoma) with low metabolic activity on PET scan. She presents today for CT-guided biopsy of this rib lesion for further evaluation.She was also recently treated for c diff.  Past Medical History:  Diagnosis Date  . Acute cholecystitis 11/06/2016  . Atrial fibrillation ([place])   . Cholangitis   . Diabetes mellitus without complication ([place])   . Elevated LFTs   . GERD (gastroesophageal reflux disease)   . Hypertension   . Pulmonary nodule   . Rib lesion     Past Surgical History:  Procedure Laterality Date  . CHOLECYSTECTOMY N/A 11/10/2016   Procedure: LAPAROSCOPIC CHOLECYSTECTOMY WITH INTRAOPERATIVE CHOLANGIOGRAM;  Surgeon: Stark Klein, MD;  Location: [facility];  Service: General;  Laterality: N/A;    Allergies: Statins  Medications: Prior to Admission medications   Medication Sig Start Date End Date Taking? Authorizing Provider  carvedilol (COREG) 25 MG tablet Take 1 tablet (25 mg total) by mouth 2 (two) times daily with a meal. 11/25/16  Yes Baldwin Jamaica, PA-C  furosemide (LASIX) 20 MG tablet Take 1 tablet (20 mg total) by mouth daily. 11/25/16 02/03/17 Yes Baldwin Jamaica, PA-C  glimepiride (AMARYL) 4 MG tablet Take 8 mg by mouth daily before breakfast.   Yes [provider]  lactobacillus acidophilus & bulgar (LACTINEX) chewable tablet Chew 1 tablet 3 (three) times daily with meals for 20 days by mouth. 01/21/17 02/10/17 Yes Brunetta Genera, MD  lisinopril (PRINIVIL,ZESTRIL) 2.5 MG tablet Take 1  tablet (2.5 mg total) by mouth daily. 11/25/16 02/03/17 Yes Baldwin Jamaica, PA-C  NIFEdipine (ADALAT CC) 90 MG 24 hr tablet Take 90 mg by mouth daily.   Yes [provider]  RABEprazole (ACIPHEX) 20 MG tablet Take 20 mg by mouth 2 (two) times daily.    Yes [provider]  vancomycin (VANCOCIN) 250 MG capsule Take 1 capsule (250 mg total) 4 (four) times daily for 14 days by mouth. 01/21/17 02/04/17 Yes Kale, Cloria Spring, MD  apixaban (ELIQUIS) 5 MG TABS tablet Take 1 tablet (5 mg total) by mouth 2 (two) times daily. 11/25/16   Baldwin Jamaica, PA-C  potassium chloride SA (K-DUR,KLOR-CON) 20 MEQ tablet Take 2 tablets (40 mEq total) daily for 14 days by mouth. 01/12/17 01/26/17  Brunetta Genera, MD     Family History  Problem Relation Age of Onset  . Stroke Mother   . Diabetes Mother   . Hypertension Mother   . Chronic Renal Failure Father   . Cancer Brother        esophageal  . Melanoma Brother     Social History   Socioeconomic History  . Marital status: Married    Spouse name: None  . Number of children: None  . Years of education: None  . Highest education level: None  Social Needs  . Financial resource strain: None  . Food insecurity - worry: None  . Food insecurity - inability: None  . Transportation needs - medical: None  . Transportation needs - non-medical: None  Occupational History  . None  Tobacco Use  . Smoking  status: Never Smoker  . Smokeless tobacco: Never Used  Substance and Sexual Activity  . Alcohol use: No  . Drug use: No  . Sexual activity: None  Other Topics Concern  . None  Social History Narrative   Lives with spouse in [place].      Review of Systems currently denies fever, headache, chest pain, dyspnea, abdominal/back pain, nausea, vomiting or bleeding.  She does have occasional cough.  She currently denies diarrhea.  Vital Signs: BP (!) 160/83   Pulse 77   Temp (!) 97.3 F (36.3 C) (Oral)   Resp 20   Ht 5'  (1.524 m)   Wt 184 lb (83.5 kg)   SpO2 98%   BMI 35.94 kg/m   Physical Exam awake, alert.  Chest clear to auscultation bilaterally.  Heart with nl rate, irregular rhythm; abdomen  soft, positive bowel sounds, nontender.  Extremities with full range of motion.  Imaging: No results found.  Labs:  CBC: Recent Labs    11/11/16 0423 11/12/16 0522 01/12/17 1224 02/03/17 0709  WBC 11.0* 11.6* 11.2* 11.2*  HGB 11.9* 11.6* 12.0 12.0  HCT 38.6 38.2 37.0 36.9  PLT 336 341 378 400    COAGS: Recent Labs    11/07/16 0237 02/03/17 0709  INR 1.23 0.99  APTT 36  --     BMP: Recent Labs    11/10/16 0307 11/11/16 0423 11/12/16 0522 01/12/17 1226 02/03/17 0709  NA 139 137 139 141 139  K 5.2* 4.2 3.4* 2.8* 3.6  CL 100* 98* 101  --  102  CO2 31 30 30 29 27   GLUCOSE 80 154* 108* 58* 109*  BUN 15 17 18  17.3 26*  CALCIUM 8.6* 8.3* 8.4* 9.1 9.3  CREATININE 1.06* 1.09* 0.98 1.6* 1.08*  GFRNONAA 52* 50* 57*  --  50*  GFRAA 60* 58* >60  --  58*    LIVER FUNCTION TESTS: Recent Labs    11/10/16 0307 11/11/16 0423 11/12/16 0522 01/12/17 1224 01/12/17 1226  BILITOT 3.6* 1.1 1.2  --  0.39  AST 39 48* 36  --  14  ALT 60* 52 42  --  7  ALKPHOS 367* 323* 266*  --  90  PROT 6.0* 6.6 6.1* 6.7 7.3  ALBUMIN 2.2* 2.3* 2.3*  --  3.1*    TUMOR MARKERS: No results for input(s): AFPTM, CEA, CA199, CHROMGRNA in the last 8760 hours.  Assessment and Plan: 71 y.o. female with past medical history of DM, GERD,HTN, afib on eliquis , recent c diff infection and recent imaging which has revealed a left fifth anterior rib lesion, possibly chondroid lesion (endochondroma versus low-grade chondrosarcoma) with low metabolic activity on PET scan. She presents today for CT-guided biopsy of this rib lesion for further evaluation.Risks and benefits discussed with the patient/family including, but not limited to bleeding, infection, damage to adjacent structures or low yield requiring additional  tests.All of the patient's questions were answered, patient is agreeable to proceed.Consent signed and in chart.     Thank you for this interesting consult.  I greatly enjoyed meeting Tara Serrano and look forward to participating in their care.  A copy of this report was sent to the requesting provider on this date.  Electronically Signed: D. Rowe Robert, PA-C 02/03/2017, 8:29 AM   I spent a total of 25 minutes  in face to face in clinical consultation, greater than 50% of which was counseling/coordinating care for CT guided biopsy of left rib lesion

## 2017-02-03 NOTE — Discharge Instructions (Signed)

## 2017-02-11 ENCOUNTER — Telehealth: Payer: Self-pay | Admitting: Hematology

## 2017-02-11 ENCOUNTER — Encounter: Payer: Self-pay | Admitting: Hematology

## 2017-02-11 ENCOUNTER — Ambulatory Visit (HOSPITAL_BASED_OUTPATIENT_CLINIC_OR_DEPARTMENT_OTHER): Payer: Medicare Other | Admitting: Hematology

## 2017-02-11 VITALS — BP 154/77 | HR 64 | Temp 98.2°F | Resp 18 | Ht 60.0 in | Wt 184.8 lb

## 2017-02-11 DIAGNOSIS — R918 Other nonspecific abnormal finding of lung field: Secondary | ICD-10-CM | POA: Diagnosis not present

## 2017-02-11 DIAGNOSIS — M859 Disorder of bone density and structure, unspecified: Secondary | ICD-10-CM | POA: Diagnosis present

## 2017-02-11 DIAGNOSIS — R222 Localized swelling, mass and lump, trunk: Secondary | ICD-10-CM

## 2017-02-11 DIAGNOSIS — E041 Nontoxic single thyroid nodule: Secondary | ICD-10-CM

## 2017-02-11 DIAGNOSIS — M954 Acquired deformity of chest and rib: Secondary | ICD-10-CM

## 2017-02-11 DIAGNOSIS — A0472 Enterocolitis due to Clostridium difficile, not specified as recurrent: Secondary | ICD-10-CM

## 2017-02-11 DIAGNOSIS — E876 Hypokalemia: Secondary | ICD-10-CM

## 2017-02-11 DIAGNOSIS — R590 Localized enlarged lymph nodes: Secondary | ICD-10-CM | POA: Diagnosis not present

## 2017-02-11 NOTE — Progress Notes (Signed)
HEMATOLOGY/ONCOLOGY CONSULTATION NOTE  Date of Service: 02/11/2017  Patient Care Team: Olin Hauser, MD as PCP - General (Family Medicine)  CHIEF COMPLAINTS/PURPOSE OF CONSULTATION:  Undetermined left rib lesion  HISTORY OF PRESENTING ILLNESS:   Tara Serrano is a wonderful 71 y.o. female who has been referred to [place] by Dr Claiborne Billings, Gwyndolyn Saxon, MD for evaluation and management of left rib lesions seen on recent CT A/P.   Initially, the patient presented into the ED on 11/06/16 complaining of a one week history of upper abdominal pain with associated nausea, poor appetite. During her visit to the ED she was found to have gallstones, distended gallbladder and dilated CBD on CT A/P. While in the ED she also became increasingly hypoxic and ABG was found to have significant CO2 retention. She was admitted to ICU at that time for workup with sepsis due to cholangitis.   During her admission she underwent a cholecystectomy and Zosyn therapy. Her cultures were without growth. She was discharged with Augmentin. Incidentally, on her CT she was found to have 60mm right lower lobe pulmonary nodule, 35mm cystic mass on tail of the pancreas, retroperitoneal lymphadenopathy, 3.7cm left fifth rib lesion, and sub-centimeter T1 hyperintense renal cortical lesions. She was discharged on 11/12/16 from her admission. Surgical pathology from her cholecystectomy showed no evidence of malignancy within the gallbladder.   On 12/10/16 she underwent PET/CT with results as demonstrated: Right lower lobe pulmonary nodule is not hypermetabolic. Recommend followup noncontrast chest CT in 6 months. Left fifth anterior rib lesion appears to be a chondroid lesion with low metabolic activity. 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 Biopsy versus close surveillance. Cystic lesion involving the right thyroid lobe is not hypermetabolic. Ultrasound follow-up suggested. She was subsequently referred into medical oncology for  further management of her disease.   Prior to her admission, she reports that she noticed approximately one year ago she was sick with a UTI with similar symptoms to her admission. She was placed on antibiotics which resolved her symptoms mostly, however, following this her symptoms had returned shortly after and she has not felt normal since. She also reports that she had lost approximately 25lbs throughout this time as well.   Since her surgery, she has been healing well. She reports that she has been experiencing some diarrhea, however, this has been manageable. Otherwise she has been feeling well. She has been keeping her fluid intake up throughout this and she has not felt dehydrated. She did try taking Imodium for this which helped, however, she stopped this because of side effects. She denies any abdominal cramping during her diarrhea, but she has noticed some drops of blood that she believes to be related to hemorrhoids. She has not recently had a mammogram. Her last colonoscopy was 2-3 years ago. She is now feeling mostly at her baseline otherwise and has otherwise been asymptomatic. She denies any pain specifically within her chest wall or palpable masses along the chest wall.   On review of systems, pt denies fever, chills, rash, mouth sores, weight loss, decreased appetite, urinary complaints. Denies pain. Pt denies abdominal pain, nausea, vomiting. She does reports ongoing diarrhea since her cholecystectomy. She also has chronic leg swelling which has been much improved lately since her admission.    INTERVAL HISTORY:   Tara Serrano is here for a follow up of her left rib lesion. Of note since her last visit, she had a CT biopsy on 02/03/2017 with results showing bone marrow, reactive plasma cells with no tumor.  She states she is doing well overall. She reports her diarrhea has resolved completely.  On ROS, pt reports and denies diarrhea, fever, chills, night sweats and any other  accompanying symptoms.  MEDICAL HISTORY:  Past Medical History:  Diagnosis Date  . Acute cholecystitis 11/06/2016  . Atrial fibrillation ([place])   . Cholangitis   . Diabetes mellitus without complication ([place])   . Elevated LFTs   . GERD (gastroesophageal reflux disease)   . Hypertension   . Pulmonary nodule   . Rib lesion     SURGICAL HISTORY: Past Surgical History:  Procedure Laterality Date  . CHOLECYSTECTOMY N/A 11/10/2016   Procedure: LAPAROSCOPIC CHOLECYSTECTOMY WITH INTRAOPERATIVE CHOLANGIOGRAM;  Surgeon: Stark Klein, MD;  Location: [facility];  Service: General;  Laterality: N/A;    SOCIAL HISTORY: Social History   Socioeconomic History  . Marital status: Married    Spouse name: Not on file  . Number of children: Not on file  . Years of education: Not on file  . Highest education level: Not on file  Social Needs  . Financial resource strain: Not on file  . Food insecurity - worry: Not on file  . Food insecurity - inability: Not on file  . Transportation needs - medical: Not on file  . Transportation needs - non-medical: Not on file  Occupational History  . Not on file  Tobacco Use  . Smoking status: Never Smoker  . Smokeless tobacco: Never Used  Substance and Sexual Activity  . Alcohol use: No  . Drug use: No  . Sexual activity: Not on file  Other Topics Concern  . Not on file  Social History Narrative   Lives with spouse in [place].    FAMILY HISTORY: Family History  Problem Relation Age of Onset  . Stroke Mother   . Diabetes Mother   . Hypertension Mother   . Chronic Renal Failure Father   . Cancer Brother        esophageal  . Melanoma Brother     ALLERGIES:  is allergic to statins.  MEDICATIONS:  Current Outpatient Medications  Medication Sig Dispense Refill  . apixaban (ELIQUIS) 5 MG TABS tablet Take 1 tablet (5 mg total) by mouth 2 (two) times daily. 60 tablet 5  . carvedilol (COREG) 25 MG tablet Take 1 tablet (25 mg total) by mouth 2 (two)  times daily with a meal. 60 tablet 5  . furosemide (LASIX) 20 MG tablet Take 1 tablet (20 mg total) by mouth daily. 30 tablet 5  . glimepiride (AMARYL) 4 MG tablet Take 8 mg by mouth daily before breakfast.    . lisinopril (PRINIVIL,ZESTRIL) 2.5 MG tablet Take 1 tablet (2.5 mg total) by mouth daily. 30 tablet 5  . NIFEdipine (ADALAT CC) 90 MG 24 hr tablet Take 90 mg by mouth daily.    . potassium chloride SA (K-DUR,KLOR-CON) 20 MEQ tablet Take 2 tablets (40 mEq total) daily for 14 days by mouth. 28 tablet 0  . RABEprazole (ACIPHEX) 20 MG tablet Take 20 mg by mouth 2 (two) times daily.      No current facility-administered medications for this visit.     REVIEW OF SYSTEMS:    A 10+ POINT REVIEW OF SYSTEMS WAS OBTAINED including neurology, dermatology, psychiatry, cardiac, respiratory, lymph, extremities, GI, GU, Musculoskeletal, constitutional, breasts, reproductive, HEENT.  All pertinent positives are noted in the HPI.  All others are negative.  PHYSICAL EXAMINATION: ECOG PERFORMANCE STATUS: 1 - Symptomatic but completely ambulatory  . Vitals:  02/11/17 0904  BP: (!) 154/77  Pulse: 64  Resp: 18  Temp: 98.2 F (36.8 C)  SpO2: 100%   Filed Weights   02/11/17 0904  Weight: 184 lb 12.8 oz (83.8 kg)   .Body mass index is 36.09 kg/m.  GENERAL:alert, in no acute distress and comfortable SKIN: no acute rashes, no significant lesions EYES: conjunctiva are pink and non-injected, sclera anicteric OROPHARYNX: MMM, no exudates, no oropharyngeal erythema or ulceration NECK: supple, no JVD. Palpable cyst is felt within the right neck.  LYMPH:  no palpable lymphadenopathy in the cervical, axillary or inguinal regions LUNGS: clear to auscultation b/l with normal respiratory effort CHEST: She has a palpable mass on the left midchest anteriorly.  HEART: regular rate & rhythm ABDOMEN:  normoactive bowel sounds, non tender, not distended. Well healed scars from recent laparoscopic  cholecystectomy. Extremity: 2+ pedal edema bilaterally  PSYCH: alert & oriented x 3 with fluent speech NEURO: no focal motor/sensory deficits  LABORATORY DATA:  I have reviewed the data as listed  . CBC Latest Ref Rng & Units 02/03/2017 01/12/2017 11/12/2016  WBC 4.0 - 10.5 K/uL 11.2(H) 11.2(H) 11.6(H)  Hemoglobin 12.0 - 15.0 g/dL 12.0 12.0 11.6(L)  Hematocrit 36.0 - 46.0 % 36.9 37.0 38.2  Platelets 150 - 400 K/uL 400 378 341    . CMP Latest Ref Rng & Units 02/03/2017 01/12/2017 01/12/2017  Glucose 65 - 99 mg/dL 109(H) 58(L) -  BUN 6 - 20 mg/dL 26(H) 17.3 -  Creatinine 0.44 - 1.00 mg/dL 1.08(H) 1.6(H) -  Sodium 135 - 145 mmol/L 139 141 -  Potassium 3.5 - 5.1 mmol/L 3.6 2.8(LL) -  Chloride 101 - 111 mmol/L 102 - -  CO2 22 - 32 mmol/L 27 29 -  Calcium 8.9 - 10.3 mg/dL 9.3 9.1 -  Total Protein 6.4 - 8.3 g/dL - 7.3 6.7  Total Bilirubin 0.20 - 1.20 mg/dL - 0.39 -  Alkaline Phos 40 - 150 U/L - 90 -  AST 5 - 34 U/L - 14 -  ALT 0 - 55 U/L - 7 -   . Lab Results  Component Value Date   LDH 242 01/12/2017         RADIOGRAPHIC STUDIES: I have personally reviewed the radiological images as listed and agreed with the findings in the report. Ct Biopsy  Result Date: 02/03/2017 INDICATION: 71 year old with an indeterminate expansile lesion involving the anterior left fifth rib. Tissue diagnosis is needed. EXAM: CT-GUIDED BIOPSY OF LEFT RIB LESION MEDICATIONS: None. ANESTHESIA/SEDATION: Moderate (conscious) sedation was employed during this procedure. A total of Versed 2.0 mg and Fentanyl 100 mcg was administered intravenously. Moderate Sedation Time: 21 minutes. The patient's level of consciousness and vital signs were monitored continuously by radiology nursing throughout the procedure under my direct supervision. FLUOROSCOPY TIME:  None COMPLICATIONS: None immediate. PROCEDURE: Informed written consent was obtained from the patient after a thorough discussion of the procedural risks,  benefits and alternatives. All questions were addressed. A timeout was performed prior to the initiation of the procedure. Patient was placed supine. CT images through the chest were obtained. The left anterior chest was prepped with chlorhexidine and a sterile field was created. Skin and soft tissues were anesthetized with 1% lidocaine. 11 gauge bone needle was directed into the expansile lesion with CT guidance. Attempted to obtain core biopsies with a coaxial bone biopsy needle. One adequate core sample was obtained. Otherwise, the sample was mostly bloody. Approximately 5 mL of blood was collected for cytology. Needle  removed without complication. Bandage placed over the puncture site. FINDINGS: Again noted is the expansile lesion involving the anterior medial left fifth rib. Needle position was confirmed within the lesion. Small core biopsy was obtained. Otherwise, the sample was mostly blood. IMPRESSION: CT-guided biopsy of the expansile left fifth rib lesion. Electronically Signed   By: Markus Daft M.D.   On: 02/03/2017 10:57     PET/CT 12/10/2016: IMPRESSION: 1. Right lower lobe pulmonary nodule is not hypermetabolic. Recommend followup noncontrast chest CT in 6 months. 2. Left fifth anterior rib lesion appears to be a chondroid lesion with low metabolic activity. 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 Biopsy versus close surveillance. 3. Cystic lesion involving the right thyroid lobe is not hypermetabolic. Ultrasound follow-up suggested. 4. Hypermetabolism in the liver around the gallbladder fossa likely from recent surgery. 5. Diffuse GI uptake, likely physiologic.   Electronically Signed   By: Marijo Sanes M.D.   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  Multiple small less than 1 cm cysts centered in  the renal medulla bilaterally. Nonspecific could be related to congenital cystic kidney disease, medullary cystic kidney disease or medullary sponge kidney. No associated calcifications.  1.6 cm left adrenal adenoma.  Sigmoid diverticulosis, without diverticulitis.  Cholelithiasis.     US thyroid 01/23/2017: IMPRESSION: 1. No significant change in a predominantly cystic nodule in the right thyroid lobe measuring 3.9 cm.   ASSESSMENT & PLAN:   This is a wonderful 71 y.o. female who presents today to discuss:   1) Indeterminate left rib lesion -I discussed the 12/10/16 PET/CT findings with patient in great detail today. Radiology felt that this was possibly a chondroid lesion, enchondroma versus low-grade chondrosarcoma could not be ruled out. No other bony sites of pathology were identified.   2) Mesenteric and retroperitoneal lymph nodes and diffuse uptake within the GI tract -- appear reactive ? Inflammation.  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  PLAN -Myeloma panel and SFLC done and show no overt evidence of monoclonal paraproteinemia to suggest Myeloma. -CT abdomen and labs work up of her bowels showed evidence of Sapovirus and C-Difficile which was treated with vancomycin and probiotics. Diarrhea now resolved.  -CT guided biopsy - indeterminate. -discussed with patient option of surgery referral for surgical excisional biopsy vs clinical and radipgraphic f/u. -Plan to obtain repeat CT in 4 month based on patients preference.  4) Hypokalemia - due to diarrhea was noted on previous visit. -Diarrhea now resolved. Patient was given potassium replacement that she is taking.  CT chest without contrast in 16 weeks RTC with Dr Irene Limbo in 4 months with labs and CT chest  results     All of the patients questions were answered with apparent satisfaction. The patient knows to call the clinic with any problems, questions or concerns.  I spent 20  minutes counseling the patient face to face. The total time spent in the appointment was 25 minutes and more than 50% was on counseling and direct patient cares.   Sullivan Lone MD [place] AAHIVMS [facility] Hematology/Oncology Physician [place]  (Office):  [phone] (Work cell):  [phone] (Fax):           [phone]  02/11/2017 8:45 AM  This document serves as a record of services personally performed by Sullivan Lone, MD. It was created on his behalf by Alean Rinne, a trained medical scribe. The creation of this record is based on the scribe's personal observations and the provider's statements to them.   .I have reviewed the above documentation for accuracy and completeness, and I agree with the above. Brunetta Genera MD MS

## 2017-02-11 NOTE — Telephone Encounter (Signed)
Gave avs and calendar for April 2019 °

## 2017-02-12 ENCOUNTER — Ambulatory Visit: Payer: Medicare Other

## 2017-02-12 ENCOUNTER — Other Ambulatory Visit (HOSPITAL_COMMUNITY)
Admission: RE | Admit: 2017-02-12 | Discharge: 2017-02-12 | Disposition: A | Discharge status: Home / Self Care | Payer: Medicare Other | Source: Ambulatory Visit | Attending: Hematology | Admitting: Hematology

## 2017-02-12 ENCOUNTER — Telehealth: Payer: Self-pay

## 2017-02-12 ENCOUNTER — Other Ambulatory Visit: Payer: Self-pay

## 2017-02-12 DIAGNOSIS — R197 Diarrhea, unspecified: Secondary | ICD-10-CM

## 2017-02-12 NOTE — Telephone Encounter (Signed)
Pt called this AM with symptom of diarrhea. Pt described her stool as watery. Confirmed 4-5 occurrences this morning. Diarrhea started last night. Pt was on previous course of vancomycin. Pt to continue taking probiotics 3 times daily. Cdiff PCR ordered and communicated add-on with phlebotomy and lab. If positive again, pt will begin slow taper per Dr. Irene Limbo d/t resistance.

## 2017-02-16 ENCOUNTER — Other Ambulatory Visit (HOSPITAL_COMMUNITY): Payer: Medicare Other

## 2017-02-22 ENCOUNTER — Other Ambulatory Visit: Payer: Self-pay

## 2017-02-22 ENCOUNTER — Ambulatory Visit (HOSPITAL_COMMUNITY): Payer: Medicare Other | Attending: Cardiovascular Disease

## 2017-02-22 DIAGNOSIS — I480 Paroxysmal atrial fibrillation: Secondary | ICD-10-CM | POA: Diagnosis present

## 2017-02-22 DIAGNOSIS — E119 Type 2 diabetes mellitus without complications: Secondary | ICD-10-CM | POA: Insufficient documentation

## 2017-02-22 DIAGNOSIS — I10 Essential (primary) hypertension: Secondary | ICD-10-CM | POA: Insufficient documentation

## 2017-02-23 ENCOUNTER — Telehealth: Payer: Self-pay | Admitting: *Deleted

## 2017-02-23 NOTE — Telephone Encounter (Signed)
LMOVM OF NORMAL RESULTS AND TO CALL CLINIC BACK [phone] IF ANY QUESTIONS OR CONCERNS.

## 2017-02-23 NOTE — Telephone Encounter (Signed)
-----   Message from [place], [place] sent at 02/23/2017 12:50 PM EST ----- Please let the patient know echo looked good, heart muscle strength has improved nicely.  Thanks renee

## 2017-02-24 ENCOUNTER — Telehealth: Payer: Self-pay | Admitting: Physician Assistant

## 2017-02-24 NOTE — Telephone Encounter (Signed)
New message    Patient calling with questions regarding echo results. Please call

## 2017-03-10 ENCOUNTER — Telehealth: Payer: Self-pay | Admitting: Physician Assistant

## 2017-03-10 ENCOUNTER — Telehealth: Payer: Self-pay | Admitting: *Deleted

## 2017-03-10 NOTE — Telephone Encounter (Signed)
SPOKE TO PT AND PT WANTED TO KNOW IF SHE HAS TO STAY ON BLOOD THINNER DUE TO ECHO RESULTS BEING GOOD.  SHE STATES CONCERNS OF IF IRREGULAR HEARTBEAT IS FINE WHY SHOULD SHE STAY ON BLOOD THINNER SINCE THAT'S THE REASON FOR BLOOD THINNER.

## 2017-03-10 NOTE — Telephone Encounter (Signed)
New message  Pt verbalized that she is calling for the rn about her ECHO results that she had on 02/22/2017

## 2017-03-16 NOTE — Telephone Encounter (Signed)
New message     Patient upset she has called several times to go over echo results and have not received a response back from nurse or anyone else.

## 2017-03-16 NOTE — Telephone Encounter (Signed)
Returned call to patient. Patient states she called on 1/2 asking about irregular heartbeat, eliquis and echo results. I made patient aware of echo results. Explained to patient that echo looks at the pumping function the heart not the electrical rhythm of the heart. I also informed patient of Tommye Standard, [place] recommendation to to stay on eliquis since afib can come and go at times. Patient verbalized understanding and thanked me for the call.

## 2017-06-04 ENCOUNTER — Encounter (HOSPITAL_COMMUNITY): Payer: Self-pay

## 2017-06-04 ENCOUNTER — Ambulatory Visit (HOSPITAL_COMMUNITY)
Admission: RE | Admit: 2017-06-04 | Discharge: 2017-06-04 | Disposition: A | Discharge status: Home / Self Care | Payer: Medicare Other | Source: Ambulatory Visit | Attending: Hematology | Admitting: Hematology

## 2017-06-04 DIAGNOSIS — M899 Disorder of bone, unspecified: Secondary | ICD-10-CM | POA: Insufficient documentation

## 2017-06-04 DIAGNOSIS — I517 Cardiomegaly: Secondary | ICD-10-CM | POA: Insufficient documentation

## 2017-06-04 DIAGNOSIS — I2510 Atherosclerotic heart disease of native coronary artery without angina pectoris: Secondary | ICD-10-CM | POA: Diagnosis not present

## 2017-06-04 DIAGNOSIS — R918 Other nonspecific abnormal finding of lung field: Secondary | ICD-10-CM | POA: Insufficient documentation

## 2017-06-04 DIAGNOSIS — M954 Acquired deformity of chest and rib: Secondary | ICD-10-CM

## 2017-06-04 DIAGNOSIS — D3502 Benign neoplasm of left adrenal gland: Secondary | ICD-10-CM | POA: Insufficient documentation

## 2017-06-04 DIAGNOSIS — E0789 Other specified disorders of thyroid: Secondary | ICD-10-CM | POA: Insufficient documentation

## 2017-06-10 NOTE — Progress Notes (Signed)
HEMATOLOGY/ONCOLOGY CONSULTATION NOTE  Date of Service: 06/11/17  Patient Care Team: Tara Anchors, MD as PCP - General (Family Medicine)  CHIEF COMPLAINTS/PURPOSE OF CONSULTATION:  Rib mass ? Enchondroma  HISTORY OF PRESENTING ILLNESS:   Tara Serrano is a wonderful 72 y.o. female who has been referred to [place] by Dr Claiborne Billings, Grace Bushy, MD for evaluation and management of left rib lesions seen on recent CT A/P.   Initially, the patient presented into the ED on 11/06/16 complaining of a one week history of upper abdominal pain with associated nausea, poor appetite. During her visit to the ED she was found to have gallstones, distended gallbladder and dilated CBD on CT A/P. While in the ED she also became increasingly hypoxic and ABG was found to have significant CO2 retention. She was admitted to ICU at that time for workup with sepsis due to cholangitis.   During her admission she underwent a cholecystectomy and Zosyn therapy. Her cultures were without growth. She was discharged with Augmentin. Incidentally, on her CT she was found to have 75mm right lower lobe pulmonary nodule, 89mm cystic mass on tail of the pancreas, retroperitoneal lymphadenopathy, 3.7cm left fifth rib lesion, and sub-centimeter T1 hyperintense renal cortical lesions. She was discharged on 11/12/16 from her admission. Surgical pathology from her cholecystectomy showed no evidence of malignancy within the gallbladder.   On 12/10/16 she underwent PET/CT with results as demonstrated: Right lower lobe pulmonary nodule is not hypermetabolic. Recommend followup noncontrast chest CT in 6 months. Left fifth anterior rib lesion appears to be a chondroid lesion with low metabolic activity. 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 Biopsy versus close surveillance. Cystic lesion involving the right thyroid lobe is not hypermetabolic. Ultrasound follow-up suggested. She was subsequently referred into medical oncology for further  management of her disease.   Prior to her admission, she reports that she noticed approximately one year ago she was sick with a UTI with similar symptoms to her admission. She was placed on antibiotics which resolved her symptoms mostly, however, following this her symptoms had returned shortly after and she has not felt normal since. She also reports that she had lost approximately 25lbs throughout this time as well.   Since her surgery, she has been healing well. She reports that she has been experiencing some diarrhea, however, this has been manageable. Otherwise she has been feeling well. She has been keeping her fluid intake up throughout this and she has not felt dehydrated. She did try taking Imodium for this which helped, however, she stopped this because of side effects. She denies any abdominal cramping during her diarrhea, but she has noticed some drops of blood that she believes to be related to hemorrhoids. She has not recently had a mammogram. Her last colonoscopy was 2-3 years ago. She is now feeling mostly at her baseline otherwise and has otherwise been asymptomatic. She denies any pain specifically within her chest wall or palpable masses along the chest wall.   On review of systems, pt denies fever, chills, rash, mouth sores, weight loss, decreased appetite, urinary complaints. Denies pain. Pt denies abdominal pain, nausea, vomiting. She does reports ongoing diarrhea since her cholecystectomy. She also has chronic leg swelling which has been much improved lately since her admission.    INTERVAL HISTORY:   Tara Serrano is here for a follow up of her left rib lesion. The patient's last visit with [place] was on 02/11/17. The pt reports that she is doing well overall.   The pt  reports that she sees her PCP Dr. Amado Nash every 3 months and notes no new concerns. She continues on her medications for her A fib and has had no bleeding concerns.  She is now on a lower dose of glimepride and  notes that her sugars have been better.   She notes that she had been using compression socks but stopped using them because they were uncomfortable.   Of note since the patient's last visit, pt has had a CT Chest completed on 06/04/17 with results revealing Stable right lower lobe pulmonary nodules, largest measuring 11 mm. Recommend continued follow-up by chest CT in 12 months. Stable expansile lesion in the left anterior 5th rib.  Lab results today (06/11/17) of CBC, CMP, and Reticulocytes is as follows: all values are WNL except for Hgb at 11.2, MCHC at 31.1, Albumin at 3.1.  On review of systems, pt reports resolved diarrhea, eating well, stable weight, leg swelling, and denies chest wall pain, breathing irregularities, bleeding concerns, and any other symptoms.   MEDICAL HISTORY:  Past Medical History:  Diagnosis Date  . Acute cholecystitis 11/06/2016  . Atrial fibrillation ([place])   . Cholangitis   . Diabetes mellitus without complication ([place])   . Elevated LFTs   . GERD (gastroesophageal reflux disease)   . Hypertension   . Pulmonary nodule   . Rib lesion     SURGICAL HISTORY: Past Surgical History:  Procedure Laterality Date  . CHOLECYSTECTOMY N/A 11/10/2016   Procedure: LAPAROSCOPIC CHOLECYSTECTOMY WITH INTRAOPERATIVE CHOLANGIOGRAM;  Surgeon: Stark Klein, MD;  Location: [place];  Service: General;  Laterality: N/A;    SOCIAL HISTORY: Social History   Socioeconomic History  . Marital status: Married    Spouse name: Not on file  . Number of children: Not on file  . Years of education: Not on file  . Highest education level: Not on file  Occupational History  . Not on file  Social Needs  . Financial resource strain: Not on file  . Food insecurity:    Worry: Not on file    Inability: Not on file  . Transportation needs:    Medical: Not on file    Non-medical: Not on file  Tobacco Use  . Smoking status: Never Smoker  . Smokeless tobacco: Never Used  Substance and  Sexual Activity  . Alcohol use: No  . Drug use: No  . Sexual activity: Not on file  Lifestyle  . Physical activity:    Days per week: Not on file    Minutes per session: Not on file  . Stress: Not on file  Relationships  . Social connections:    Talks on phone: Not on file    Gets together: Not on file    Attends religious service: Not on file    Active member of club or organization: Not on file    Attends meetings of clubs or organizations: Not on file    Relationship status: Not on file  . Intimate partner violence:    Fear of current or ex partner: Not on file    Emotionally abused: Not on file    Physically abused: Not on file    Forced sexual activity: Not on file  Other Topics Concern  . Not on file  Social History Narrative   Lives with spouse in [place].    FAMILY HISTORY: Family History  Problem Relation Age of Onset  . Stroke Mother   . Diabetes Mother   . Hypertension Mother   .  Chronic Renal Failure Father   . Cancer Brother        esophageal  . Melanoma Brother     ALLERGIES:  is allergic to statins.  MEDICATIONS:  Current Outpatient Medications  Medication Sig Dispense Refill  . apixaban (ELIQUIS) 5 MG TABS tablet Take 1 tablet (5 mg total) by mouth 2 (two) times daily. 60 tablet 5  . carvedilol (COREG) 25 MG tablet Take 1 tablet (25 mg total) by mouth 2 (two) times daily with a meal. 60 tablet 5  . glimepiride (AMARYL) 4 MG tablet Take 4 mg by mouth daily before breakfast.     . NIFEdipine (ADALAT CC) 90 MG 24 hr tablet Take 90 mg by mouth daily.    . RABEprazole (ACIPHEX) 20 MG tablet Take 20 mg by mouth 2 (two) times daily.     . furosemide (LASIX) 20 MG tablet Take 1 tablet (20 mg total) by mouth daily. 30 tablet 5  . lisinopril (PRINIVIL,ZESTRIL) 2.5 MG tablet Take 1 tablet (2.5 mg total) by mouth daily. 30 tablet 5   No current facility-administered medications for this visit.     REVIEW OF SYSTEMS:    10 Point review of Systems was done  is negative except as noted above.   PHYSICAL EXAMINATION: ECOG PERFORMANCE STATUS: 1 - Symptomatic but completely ambulatory  . Vitals:   06/11/17 1020  BP: (!) 148/64  Pulse: 70  Resp: 17  Temp: 98.5 F (36.9 C)  SpO2: 100%   Filed Weights   06/11/17 1020  Weight: 186 lb 3.2 oz (84.5 kg)   .Body mass index is 36.36 kg/m.  GENERAL:alert, in no acute distress and comfortable SKIN: no acute rashes, no significant lesions EYES: conjunctiva are pink and non-injected, sclera anicteric OROPHARYNX: MMM, no exudates, no oropharyngeal erythema or ulceration NECK: supple, no JVD. Palpable cyst is felt within the right neck. LYMPH:  no palpable lymphadenopathy in the cervical, axillary or inguinal regions LUNGS: clear to auscultation b/l with normal respiratory effort HEART: regular rate & rhythm CHEST: She has a palpable mass on the left mid-chest anteriorly. ABDOMEN:  normoactive bowel sounds , non tender, not distended. Well healed scars from recent laparoscopic cholycystectomy.  Extremity: 1+ pedal edema bilaterally PSYCH: alert & oriented x 3 with fluent speech NEURO: no focal motor/sensory deficits   LABORATORY DATA:  I have reviewed the data as listed  . CBC Latest Ref Rng & Units 06/11/2017 02/03/2017 01/12/2017  WBC 3.9 - 10.3 K/uL 8.2 11.2(H) 11.2(H)  Hemoglobin 12.0 - 15.0 g/dL - 12.0 12.0  Hematocrit 34.8 - 46.6 % 36.0 36.9 37.0  Platelets 145 - 400 K/uL 308 400 378  HGB 11.2  . CMP Latest Ref Rng & Units 06/11/2017 02/03/2017 01/12/2017  Glucose 70 - 140 mg/dL 116 109(H) 58(L)  BUN 7 - 26 mg/dL 18 26(H) 17.3  Creatinine 0.60 - 1.10 mg/dL 0.92 1.08(H) 1.6(H)  Sodium 136 - 145 mmol/L 141 139 141  Potassium 3.5 - 5.1 mmol/L 3.6 3.6 2.8(LL)  Chloride 98 - 109 mmol/L 105 102 -  CO2 22 - 29 mmol/L 27 27 29   Calcium 8.4 - 10.4 mg/dL 9.3 9.3 9.1  Total Protein 6.4 - 8.3 g/dL 7.0 - 7.3  Total Bilirubin 0.2 - 1.2 mg/dL 0.3 - 0.39  Alkaline Phos 40 - 150 U/L 117 - 90    AST 5 - 34 U/L 13 - 14  ALT 0 - 55 U/L 10 - 7   . Lab Results  Component Value  Date   LDH 242 01/12/2017         RADIOGRAPHIC STUDIES: I have personally reviewed the radiological images as listed and agreed with the findings in the report. Ct Chest Wo Contrast  Result Date: 06/04/2017 CLINICAL DATA:  Follow-up right lower lobe pulmonary nodule. EXAM: CT CHEST WITHOUT CONTRAST TECHNIQUE: Multidetector CT imaging of the chest was performed following the standard protocol without IV contrast. COMPARISON:  CT on 11/06/2016 and PET-CT on 12/10/2016 FINDINGS: Cardiovascular: No acute findings. Stable mild cardiomegaly. Coronary artery calcification. Mediastinum/Nodes: No pathologically enlarged lymph nodes. 3 cm cystic lesion in right thyroid lobe is stable and showed absence of FDG uptake on recent PET. Lungs/Pleura: 2 adjacent right lower lobe pulmonary nodules remain stable, largest measuring 11 mm. No new or enlarging pulmonary nodules or masses identified. No evidence of pulmonary infiltrate or pleural effusion. Upper Abdomen: Stable 1.6 cm low-attenuation left adrenal mass, consistent with benign adenoma. Musculoskeletal: Expansile lesion in the left anterior 5th rib remains stable. IMPRESSION: Stable right lower lobe pulmonary nodules, largest measuring 11 mm. Recommend continued follow-up by chest CT in 12 months. This recommendation follows the consensus statement: Guidelines for Management of Small Pulmonary Nodules Detected on CT Images: From the Fleischner Society 2017; Radiology 2017; 284:228-243. Stable expansile lesion in the left anterior 5th rib. Stable right thyroid lobe cystic lesion. Mild cardiomegaly and coronary artery calcification. Stable small benign left adrenal adenoma. Electronically Signed   By: Earle Gell M.D.   On: 06/04/2017 10:16     PET/CT 12/10/2016: IMPRESSION: 1. Right lower lobe pulmonary nodule is not hypermetabolic. Recommend followup noncontrast chest CT  in 6 months. 2. Left fifth anterior rib lesion appears to be a chondroid lesion with low metabolic activity. 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 Biopsy versus close surveillance. 3. Cystic lesion involving the right thyroid lobe is not hypermetabolic. Ultrasound follow-up suggested. 4. Hypermetabolism in the liver around the gallbladder fossa likely from recent surgery. 5. Diffuse GI uptake, likely physiologic.   Electronically Signed   By: Marijo Sanes M.D.   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  Multiple small less than 1 cm cysts centered in the renal medulla bilaterally. Nonspecific could be related to congenital cystic kidney disease, medullary cystic kidney disease or medullary sponge kidney. No associated calcifications.  1.6 cm left adrenal adenoma.  Sigmoid diverticulosis, without diverticulitis.  Cholelithiasis.     US thyroid 01/23/2017: IMPRESSION: 1. No significant change in a predominantly cystic nodule in the right thyroid lobe measuring 3.9 cm.   ASSESSMENT & PLAN:   This is a wonderful 72 y.o. female who presents today to discuss:   1) Indeterminate left rib lesion -I discussed the 12/10/16 PET/CT findings with patient in great detail today. Radiology felt that this was possibly a chondroid lesion, enchondroma versus low-grade chondrosarcoma could not be ruled out. No other bony sites of pathology were identified.  -Discussed the findings of the most recent 06/04/17 CT reveals that the left rib expansile mass has not changed in size and that her pulmonary nodule in the right lower lobe is also stable.  -Rib mass appears to be a benign lesion such as a an enchondroma  -Discussed that the stability of her CT findings are reassuring  -We will see pt back in 6 months with  repeat CT scan -Discussed that if  her next CT scan remains unchanged as well, we would be fine to have her PCP continue to monitor this.    2) Mesenteric and retroperitoneal lymph nodes and diffuse uptake within the GI tract -- appear reactive ? Inflammation.  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  PLAN -Myeloma panel and SFLC done and show no overt evidence of monoclonal paraproteinemia to suggest Myeloma. -CT guided biopsy - indeterminate. -discussed with patient option of surgery referral for surgical excisional biopsy vs clinical and radipgraphic f/u. -Discussed pt labwork today; blood counts and chemistries are normal except for mild anemia with Hgb at 11.2.  -Recommended sports compression socks for her leg swelling that would likely be more comfortable than her previous compression socks   4) Hypokalemia - due to diarrhea was noted on previous visit. -Diarrhea now resolved. Patient was given potassium replacement that she is taking.  CT chest in 24 weeks RTC with Dr Irene Limbo in 6 months with labs and CT chest      All of the patients questions were answered with apparent satisfaction. The patient knows to call the clinic with any problems, questions or concerns.  . The total time spent in the appointment was 15 minutes and more than 50% was on counseling and direct patient cares.   Sullivan Lone MD [place] AAHIVMS [facility] Hematology/Oncology Physician [facility]  (Office):       [phone] (Work cell):  [phone] (Fax):           [phone]  06/11/2017 11:13 AM  This document serves as a record of services personally performed by Sullivan Lone, MD. It was created on his behalf by Baldwin Jamaica, a trained medical scribe. The creation of this record is  based on the scribe's personal observations and the provider's statements to them.   .I have reviewed the above documentation for accuracy and completeness, and I agree with the above. Brunetta Genera MD MS

## 2017-06-11 ENCOUNTER — Telehealth: Payer: Self-pay

## 2017-06-11 ENCOUNTER — Inpatient Hospital Stay: Payer: Medicare Other | Attending: Hematology | Admitting: Hematology

## 2017-06-11 ENCOUNTER — Encounter: Payer: Self-pay | Admitting: Hematology

## 2017-06-11 ENCOUNTER — Inpatient Hospital Stay: Payer: Medicare Other

## 2017-06-11 VITALS — BP 148/64 | HR 70 | Temp 98.5°F | Resp 17 | Ht 60.0 in | Wt 186.2 lb

## 2017-06-11 DIAGNOSIS — E876 Hypokalemia: Secondary | ICD-10-CM

## 2017-06-11 DIAGNOSIS — R222 Localized swelling, mass and lump, trunk: Secondary | ICD-10-CM

## 2017-06-11 DIAGNOSIS — M899 Disorder of bone, unspecified: Secondary | ICD-10-CM

## 2017-06-11 DIAGNOSIS — E041 Nontoxic single thyroid nodule: Secondary | ICD-10-CM

## 2017-06-11 DIAGNOSIS — M954 Acquired deformity of chest and rib: Secondary | ICD-10-CM

## 2017-06-11 DIAGNOSIS — R911 Solitary pulmonary nodule: Secondary | ICD-10-CM

## 2017-06-11 DIAGNOSIS — R590 Localized enlarged lymph nodes: Secondary | ICD-10-CM | POA: Diagnosis present

## 2017-06-11 LAB — COMPREHENSIVE METABOLIC PANEL
ALT: 10 U/L (ref 0–55)
AST: 13 U/L (ref 5–34)
Albumin: 3.1 g/dL — ABNORMAL LOW (ref 3.5–5.0)
Alkaline Phosphatase: 117 U/L (ref 40–150)
Anion gap: 9 (ref 3–11)
BILIRUBIN TOTAL: 0.3 mg/dL (ref 0.2–1.2)
BUN: 18 mg/dL (ref 7–26)
CALCIUM: 9.3 mg/dL (ref 8.4–10.4)
CHLORIDE: 105 mmol/L (ref 98–109)
CO2: 27 mmol/L (ref 22–29)
CREATININE: 0.92 mg/dL (ref 0.60–1.10)
Glucose, Bld: 116 mg/dL (ref 70–140)
Potassium: 3.6 mmol/L (ref 3.5–5.1)
Sodium: 141 mmol/L (ref 136–145)
TOTAL PROTEIN: 7 g/dL (ref 6.4–8.3)

## 2017-06-11 LAB — CBC WITH DIFFERENTIAL (CANCER CENTER ONLY)
BASOS ABS: 0 10*3/uL (ref 0.0–0.1)
BASOS PCT: 0 %
EOS ABS: 0.1 10*3/uL (ref 0.0–0.5)
EOS PCT: 2 %
HCT: 36 % (ref 34.8–46.6)
HEMOGLOBIN: 11.2 g/dL — AB (ref 11.6–15.9)
LYMPHS ABS: 1.9 10*3/uL (ref 0.9–3.3)
Lymphocytes Relative: 23 %
MCH: 27.2 pg (ref 25.1–34.0)
MCHC: 31.1 g/dL — ABNORMAL LOW (ref 31.5–36.0)
MCV: 87.4 fL (ref 79.5–101.0)
Monocytes Absolute: 0.4 10*3/uL (ref 0.1–0.9)
Monocytes Relative: 5 %
NEUTROS PCT: 70 %
Neutro Abs: 5.7 10*3/uL (ref 1.5–6.5)
PLATELETS: 308 10*3/uL (ref 145–400)
RBC: 4.12 MIL/uL (ref 3.70–5.45)
RDW: 13.8 % (ref 11.2–14.5)
WBC: 8.2 10*3/uL (ref 3.9–10.3)

## 2017-06-11 LAB — RETICULOCYTES
RBC.: 4.12 MIL/uL (ref 3.70–5.45)
RETIC COUNT ABSOLUTE: 70 10*3/uL (ref 33.7–90.7)
RETIC CT PCT: 1.7 % (ref 0.7–2.1)

## 2017-06-11 NOTE — Telephone Encounter (Signed)
Printed avs and calender of upcoming appointment. Per 4/5 los 

## 2017-06-14 ENCOUNTER — Other Ambulatory Visit: Payer: Self-pay | Admitting: Physician Assistant

## 2017-06-14 NOTE — Telephone Encounter (Signed)
Age 72 years WT 84.5kg  06/11/2017 Saw Tommye Standard 11/25/2016 06/11/2017 SrCr 0.92 06/11/2017 Hgb 11.2 HCT 36.0 Refill done for Eliquis 5mg  q 12 hours as requested

## 2017-08-13 ENCOUNTER — Telehealth: Payer: Self-pay | Admitting: Physician Assistant

## 2017-08-13 NOTE — Telephone Encounter (Signed)
Spoke with patient who has just noticed rectal bleeding as of 6/6.  She has been on Eliquis since 11/2016, but wanted to inform us of the bleeding.  I informed her to contact her PCP and also inform them.Marland Kitchen

## 2017-08-13 NOTE — Telephone Encounter (Signed)
New Message   Pt c/o medication issue:  1. Name of Medication: ELIQUIS 5 MG TABS tablet  2. How are you currently taking this medication (dosage and times per day)? TAKE 1 TABLET BY MOUTH TWICE DAILY  3. Are you having a reaction (difficulty breathing--STAT)? yes  4. What is your medication issue? Pt states she is havinf some rectal bleeding and thinks its because of the medication. Please call

## 2017-08-18 ENCOUNTER — Telehealth: Payer: Self-pay

## 2017-08-18 NOTE — Telephone Encounter (Signed)
Left generic message requesting call back if Pt has continued concerns regarding rectal bleeding/Eliquis.  Left this nurse name and # to return call.

## 2017-08-20 NOTE — Telephone Encounter (Signed)
Spoke with Pt.  Pt states she is doing well with no further bleeding.  Pt unhappy with initial triage nurse conversation.  Apologized that Pt felt she was not being cared for.  Advised Pt to call and ask for this nurse if she has any further bleeding.  Pt thanked for call.

## 2017-10-29 ENCOUNTER — Ambulatory Visit (INDEPENDENT_AMBULATORY_CARE_PROVIDER_SITE_OTHER): Payer: Medicare Other | Admitting: Internal Medicine

## 2017-10-29 ENCOUNTER — Encounter: Payer: Self-pay | Admitting: Internal Medicine

## 2017-10-29 VITALS — BP 124/82 | HR 76 | Ht 60.0 in | Wt 178.0 lb

## 2017-10-29 DIAGNOSIS — E042 Nontoxic multinodular goiter: Secondary | ICD-10-CM

## 2017-10-29 NOTE — Progress Notes (Addendum)
Patient ID: Tara Serrano, female   DOB: [birth year], 72 y.o.   MRN: [id number]    HPI  Tara Serrano is a 72 y.o.-year-old female, referred by her PCP, Ivan Anchors, MD , for evaluation for thyroid nodules, dx'ed ~2007.  Thyroid U/S (08/05/2017):  Right Predominantly cystic nodule with possible peripheral solid components measuring 3.8 x 2.7 x 3.7 cm, previously 3.9 x 2.6 x 3 cm. She also has Left  Scattered tiny cystic nodules measuring less than 7 mm are unchanged. Small 0.8 x 0.6 x 0.6 cm likely complex cystic nodule within the isthmus which previously measured 0.7 x 0.5 x 0.6 cm. No images available since she had this ultrasound at [facility].  She had previous investigation by ultrasound, and also: 02/2005: FNA: benign 11/2007: FNA: benign  Of note, the nodule was also observed on the recent PET scan (2.9 x 2.8 cm complex cyst), however, this was not hypermetabolic.  The recommendation from radiology was to only continue surveillance of this nodule, however, due to the increasing size of the nodule, the patient was referred to endocrinology for a second opinion.  Pt denies: - feeling nodules in neck - hoarseness - choking - SOB with lying down  She had a cough and clearing of throat for 2-3 mo >> her throat clearing resolved recently (after starting prednisone), and her cough has improved.   No thyroid tests available for review: 10/27/2017: TFTs reportedly normal No results found for: TSH, FREET4   Pt mentions: - fatigue - Weight loss due to lack of appetite recently in the setting of her cough  But denies: - heat intolerance/cold intolerance - tremors - palpitations - anxiety/depression - hair loss  No FH of thyroid ds. No FH of thyroid cancer. No h/o radiation tx to head or neck.  No seaweed or kelp. No recent contrast studies. +steroid use  -Prednisone-started yesterday. No herbal supplements. No Biotin supplements or Hair, Skin and Nails vitamins. / Pt also has  a history of type 2 diabetes- HbA1c on 07/27/2017 of 6.6%, HbA1c on 10/27/2017: 5.3% (decreased appetite0  Of note, patient is on Xarelto.  ROS: Constitutional: + See HPI, + nocturia Eyes: no blurry vision, no xerophthalmia ENT: no sore throat,  + see HPI Cardiovascular: no CP/+ SOB/no palpitations/+ leg swelling Respiratory: + Cough, + shortness of breath Gastrointestinal: +: N/V/D/ no C Musculoskeletal: + Muscle/no joint aches Skin: no rashes Neurological: no tremors/numbness/tingling/dizziness, + headache Psychiatric: no depression/anxiety  Past Medical History:  Diagnosis Date  . Acute cholecystitis 11/06/2016  . Atrial fibrillation ([place])   . Cholangitis   . Diabetes mellitus without complication ([place])   . Elevated LFTs   . GERD (gastroesophageal reflux disease)   . Hypertension   . Pulmonary nodule   . Rib lesion   Also, anemia.  Past Surgical History:  Procedure Laterality Date  . CHOLECYSTECTOMY N/A 11/10/2016   Procedure: LAPAROSCOPIC CHOLECYSTECTOMY WITH INTRAOPERATIVE CHOLANGIOGRAM;  Surgeon: Stark Klein, MD;  Location: [place];  Service: General;  Laterality: N/A;   Social History   Socioeconomic History  . Marital status: Married    Spouse name: Not on file  . Number of children: 1  . Years of education: Not on file  . Highest education level: Not on file  Occupational History  .  N/A  Tobacco Use  . Smoking status: Never Smoker  . Smokeless tobacco: Never Used  Substance and Sexual Activity  . Alcohol use: No  . Drug use: No  . Sexual activity: Not  on file   Lives with spouse in [place].   Current Outpatient Medications on File Prior to Visit  Medication Sig Dispense Refill  . carvedilol (COREG) 25 MG tablet Take 1 tablet (25 mg total) by mouth 2 (two) times daily with a meal. 60 tablet 5  . glimepiride (AMARYL) 4 MG tablet Take 4 mg by mouth daily before breakfast.     . NIFEdipine (ADALAT CC) 90 MG 24 hr tablet Take 90 mg by mouth daily.    Marland Kitchen  PREDNISONE PO Take 1 tablet by mouth.    . RABEprazole (ACIPHEX) 20 MG tablet Take 20 mg by mouth 2 (two) times daily.     . Rivaroxaban (XARELTO PO) Take by mouth.    . furosemide (LASIX) 20 MG tablet Take 1 tablet (20 mg total) by mouth daily. 30 tablet 5  . lisinopril (PRINIVIL,ZESTRIL) 2.5 MG tablet Take 1 tablet (2.5 mg total) by mouth daily. 30 tablet 5   No current facility-administered medications on file prior to visit.    Allergies  Allergen Reactions  . Metformin And Related Nausea Only  . Statins Other (See Comments)    Muscle problems   Family History  Problem Relation Age of Onset  . Stroke Mother   . Diabetes Mother   . Hypertension Mother   . Chronic Renal Failure Father   . Cancer Brother        esophageal  . Melanoma Brother    PE: BP 124/82   Pulse 76   Ht 5' (1.524 m)   Wt 178 lb (80.7 kg)   SpO2 98%   BMI 34.76 kg/m  Wt Readings from Last 3 Encounters:  10/29/17 178 lb (80.7 kg)  06/11/17 186 lb 3.2 oz (84.5 kg)  02/11/17 184 lb 12.8 oz (83.8 kg)   Constitutional: overweight, in NAD Eyes: PERRLA, EOMI, no exophthalmos ENT: moist mucous membranes, + R thyromegaly, no cervical lymphadenopathy Cardiovascular: RRR, No MRG, + significant pitting edema in bilateral lower legs Respiratory: CTA B Gastrointestinal: abdomen soft, NT, ND, BS+ Musculoskeletal: no deformities, strength intact in all 4;  Skin: moist, warm, no rashes Neurological: no tremor with outstretched hands, DTR normal in all 4  ASSESSMENT: 1. Thyroid nodules  PLAN: 1. Thyroid nodules -Per review of her most recent thyroid ultrasound, patient has a large right thyroid nodule and several small, not worrisome, nodules in the isthmus and left lobe.  Unfortunately, I was not able to review the images of her thyroid ultrasound, but we will try to obtain the CD (patient signed a release of information form today).  From the report, this is a large nodule, however, it is predominantly cystic,  with only peripheral solid material.  In this case, it is amenable to aspiration.  Of note, she had FNA performed twice on this nodule in the past and the results were benign.  Also, she had a recent PET scan that did not show hyperactivity in the nodule.  All of these point towards benignity. - 2 months ago she started to experience frequent throat clearing and also cough.  She was started on prednisone yesterday and she feels much better today.  However, she still coughs in the office today.  I doubt that the symptoms are related to her right thyroid nodule, however, the nodule is large, so it would not be impossible to cause compression symptoms.  We discussed that if she does not get further relief  from prednisone, she may need to stop lisinopril.  If  her throat clearing and cough persists, she will probably need aspiration of the nodule.  If the nodule contains more solid material, or if recurs after drainage, she may need to have lobectomy.  I will let her know about any change in plan after I reviewed the images of her latest ultrasound. - I will also need to obtain her most recent PFT results from PCP.  These were checked 2 days ago and patient was told that they were normal, but they are not available to me - Patient agrees with the plan - I will see her back in a year, but we may need to change this depending on next course of action  Received the U/S CD from [facility]:    R thyroid mostly cystic nodule:   L thyroid lobe:   No intervention needed for now unless she develops neck compression sxs, when we can drain the R thyroid nodule.   [name], MD PhD [facility] Endocrinology

## 2017-10-29 NOTE — Patient Instructions (Addendum)
Please come back in 1 year.   Thyroid Nodule A thyroid nodule is an isolatedgrowth of thyroid cells that forms a lump in your thyroid gland. The thyroid gland is a butterfly-shaped gland. It is found in the lower front of your neck. This gland sends chemical messengers (hormones) through your blood to all parts of your body. These hormones are important in regulating your body temperature and helping your body to use energy. Thyroid nodules are common. Most are not cancerous (are benign). You may have one nodule or several nodules. Different types of thyroid nodules include:  Nodules that grow and fill with fluid (thyroid cysts).  Nodules that produce too much thyroid hormone (hot nodules or hyperthyroid).  Nodules that produce no thyroid hormone (cold nodules or hypothyroid).  Nodules that form from cancer cells (thyroid cancers).  What are the causes? Usually, the cause of this condition is not known. What increases the risk? Factors that make this condition more likely to develop include:  Increasing age. Thyroid nodules become more common in people who are older than 72 years of age.  Gender. ? Benign thyroid nodules are more common in women. ? Cancerous (malignant) thyroid nodules are more common in men.  A family history that includes: ? Thyroid nodules. ? Pheochromocytoma. ? Thyroid carcinoma. ? Hyperparathyroidism.  Certain kinds of thyroid diseases, such as Hashimoto thyroiditis.  Lack of iodine.  A history of head and neck radiation, such as from X-rays.  What are the signs or symptoms? It is common for this condition to cause no symptoms. If you have symptoms, they may include:  A lump in your lower neck.  Feeling a lump or tickle in your throat.  Pain in your neck, jaw, or ear.  Having trouble swallowing.  Hot nodules may cause symptoms that include:  Weight loss.  Warm, flushed skin.  Feeling hot.  Feeling nervous.  A racing heartbeat.  Cold  nodules may cause symptoms that include:  Weight gain.  Dry skin.  Brittle hair. This may also occur with hair loss.  Feeling cold.  Fatigue.  Thyroid cancer nodules may cause symptoms that include:  Hard nodules that feel stuck to the thyroid gland.  Hoarseness.  Lumps in the glands near your thyroid (lymph nodes).  How is this diagnosed? A thyroid nodule may be felt by your health care provider during a physical exam. This condition may also be diagnosed based on your symptoms. You may also have tests, including:  An ultrasound. This may be done to confirm the diagnosis.  A biopsy. This involves taking a sample from the nodule and looking at it under a microscope to see if the nodule is benign.  Blood tests to make sure that your thyroid is working properly.  Imaging tests such as MRI or CT scan may be done if: ? Your nodule is large. ? Your nodule is blocking your airway. ? Cancer is suspected.  How is this treated? Treatment depends on the cause and size of your nodule or nodules. If the nodule is benign, treatment may not be necessary. Your health care provider may monitor the nodule to see if it goes away without treatment. If the nodule continues to grow, is cancerous, or does not go away:  It may need to be drained with a needle.  It may need to be removed with surgery.  If you have surgery, part or all of your thyroid gland may need to be removed as well. Follow these instructions at home:  Pay attention to any changes in your nodule.  Take over-the-counter and prescription medicines only as told by your health care provider.  Keep all follow-up visits as told by your health care provider. This is important. Contact a health care provider if:  Your voice changes.  You have trouble swallowing.  You have pain in your neck, ear, or jaw that is getting worse.  Your nodule gets bigger.  Your nodule starts to make it harder for you to breathe. Get help  right away if:  You have a sudden fever.  You feel very weak.  Your muscles look like they are shrinking (muscle wasting).  You have mood swings.  You feel very restless.  You feel confused.  You are seeing or hearing things that other people do not see or hear (having hallucinations).  You feel suddenly nauseous or throw up.  You suddenly have diarrhea.  You have chest pain.  There is a loss of consciousness. This information is not intended to replace advice given to you by your health care provider. Make sure you discuss any questions you have with your health care provider. Document Released: 01/17/2004 Document Revised: 10/27/2015 Document Reviewed: 06/06/2014 Elsevier Interactive Patient Education  2018 [employer].

## 2017-11-02 ENCOUNTER — Encounter: Payer: Self-pay | Admitting: Gastroenterology

## 2017-11-09 ENCOUNTER — Telehealth: Payer: Self-pay | Admitting: Internal Medicine

## 2017-11-09 NOTE — Telephone Encounter (Signed)
Spoke to patient who said that she was to call you regarding a bleed from the past.  Apparently, you informed her to call you if it reoccurred.  Please call, thank you.

## 2017-11-09 NOTE — Telephone Encounter (Signed)
New Message          Patient called about a bleed, sending to [facility]

## 2017-11-09 NOTE — Telephone Encounter (Signed)
Call placed to Pt.  Left VM.  Per review, Pt has appt with Dr. Rayann Heman in October.  No notes received from PCP.  Call placed to PCP-sending last OV visit notes.  Will review.

## 2017-11-12 ENCOUNTER — Telehealth: Payer: Self-pay

## 2017-11-12 NOTE — Telephone Encounter (Signed)
LMTCB

## 2017-11-12 NOTE — Telephone Encounter (Signed)
-----   Message from [name], MD sent at 11/10/2017  9:24 AM EDT ----- Loma Sousa, can you please call pt:  I received her thyroid ultrasound CD from [facility].  The right thyroid nodule appears mostly cystic and not particularly worrisome.  We would proceed with aspiration if she has bothersome neck compression symptoms, but otherwise, we can just follow it for now. Ty, C

## 2017-11-15 ENCOUNTER — Other Ambulatory Visit: Payer: Self-pay | Admitting: Physician Assistant

## 2017-11-15 NOTE — Telephone Encounter (Signed)
LMTCB

## 2017-11-16 NOTE — Telephone Encounter (Signed)
Pt is not having the issues right now so she does not want to do the aspiration right now. Agrees to just watch it.

## 2017-11-16 NOTE — Telephone Encounter (Signed)
Sounds good. Thank you

## 2017-11-26 ENCOUNTER — Ambulatory Visit (HOSPITAL_COMMUNITY)
Admission: RE | Admit: 2017-11-26 | Discharge: 2017-11-26 | Disposition: A | Discharge status: Home / Self Care | Payer: Medicare Other | Source: Ambulatory Visit | Attending: Hematology | Admitting: Hematology

## 2017-11-26 DIAGNOSIS — K862 Cyst of pancreas: Secondary | ICD-10-CM | POA: Diagnosis not present

## 2017-11-26 DIAGNOSIS — R911 Solitary pulmonary nodule: Secondary | ICD-10-CM

## 2017-11-26 DIAGNOSIS — R918 Other nonspecific abnormal finding of lung field: Secondary | ICD-10-CM | POA: Insufficient documentation

## 2017-11-29 NOTE — Telephone Encounter (Signed)
PCP notes received.  Gave to chart prep for upcoming appt on 12/15/2017.

## 2017-11-30 ENCOUNTER — Encounter: Payer: Self-pay | Admitting: Internal Medicine

## 2017-12-09 NOTE — Progress Notes (Signed)
HEMATOLOGY/ONCOLOGY CONSULTATION NOTE  Date of Service: 06/11/17  Patient Care Team: Tara Anchors, MD as PCP - General (Family Medicine)  CHIEF COMPLAINTS/PURPOSE OF CONSULTATION:  Rib mass ? Enchondroma  HISTORY OF PRESENTING ILLNESS:   Tara Serrano is a wonderful 72 y.o. female who has been referred to [place] by Dr Claiborne Billings, Grace Bushy, MD for evaluation and management of left rib lesions seen on recent CT A/P.   Initially, the patient presented into the ED on 11/06/16 complaining of a one week history of upper abdominal pain with associated nausea, poor appetite. During her visit to the ED she was found to have gallstones, distended gallbladder and dilated CBD on CT A/P. While in the ED she also became increasingly hypoxic and ABG was found to have significant CO2 retention. She was admitted to ICU at that time for workup with sepsis due to cholangitis.   During her admission she underwent a cholecystectomy and Zosyn therapy. Her cultures were without growth. She was discharged with Augmentin. Incidentally, on her CT she was found to have 85mm right lower lobe pulmonary nodule, 49mm cystic mass on tail of the pancreas, retroperitoneal lymphadenopathy, 3.7cm left fifth rib lesion, and sub-centimeter T1 hyperintense renal cortical lesions. She was discharged on 11/12/16 from her admission. Surgical pathology from her cholecystectomy showed no evidence of malignancy within the gallbladder.   On 12/10/16 she underwent PET/CT with results as demonstrated: Right lower lobe pulmonary nodule is not hypermetabolic. Recommend followup noncontrast chest CT in 6 months. Left fifth anterior rib lesion appears to be a chondroid lesion with low metabolic activity. 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 Biopsy versus close surveillance. Cystic lesion involving the right thyroid lobe is not hypermetabolic. Ultrasound follow-up suggested. She was subsequently referred into medical oncology for further  management of her disease.   Prior to her admission, she reports that she noticed approximately one year ago she was sick with a UTI with similar symptoms to her admission. She was placed on antibiotics which resolved her symptoms mostly, however, following this her symptoms had returned shortly after and she has not felt normal since. She also reports that she had lost approximately 25lbs throughout this time as well.   Since her surgery, she has been healing well. She reports that she has been experiencing some diarrhea, however, this has been manageable. Otherwise she has been feeling well. She has been keeping her fluid intake up throughout this and she has not felt dehydrated. She did try taking Imodium for this which helped, however, she stopped this because of side effects. She denies any abdominal cramping during her diarrhea, but she has noticed some drops of blood that she believes to be related to hemorrhoids. She has not recently had a mammogram. Her last colonoscopy was 2-3 years ago. She is now feeling mostly at her baseline otherwise and has otherwise been asymptomatic. She denies any pain specifically within her chest wall or palpable masses along the chest wall.   On review of systems, pt denies fever, chills, rash, mouth sores, weight loss, decreased appetite, urinary complaints. Denies pain. Pt denies abdominal pain, nausea, vomiting. She does reports ongoing diarrhea since her cholecystectomy. She also has chronic leg swelling which has been much improved lately since her admission.    INTERVAL HISTORY:   Tara Serrano is here for a follow up of her left rib lesion. The patient's last visit with [place] was on 06/11/17. The pt reports that she is doing well overall.   The pt  reports that she wsa diagnosed with PMR and is being treated with 14mg  Prednisone, tapering 1mg  down each week. She is currently taking a water pill for her stable leg swelling. She notes that she has not had any new  symptoms in the interim and has developed no new concerns. She denies chest wall pain, abdominal pains, and any constitutional symptoms.  Of note since the patient's last visit, pt has had CT Chest completed on 11/26/17 with results revealing Similar right lower lobe pulmonary nodules, including a dominant 11 mm nodule. Stability for approximately 1 year has been confirmed. Per consensus criteria, a total of 2 years of stability is required. Recommend CT follow-up at 6-12 months. 2. Similar appearance of anterior left fifth rib lesion with differential considerations of enchondroma versus low-grade Chondrosarcoma. 3. Cystic lesion within the pancreatic body is incompletely imaged. This may be new or enlarged since the prior MRI. Recommend follow-up with abdominal pre and postcontrast MRCP/MRI at approximately 1 year. Please see 11/07/2016 MRI report for further discussion. 4. Pulmonary artery enlargement suggests pulmonary arterial hypertension.  Lab results today (12/10/17) of CBC w/diff, CMP, and Reticulocytes is as follows: all values are WNL except for HGB at 10.4, HCT at 34.5, MCHC at 30.1, RDW at 16.7, ANC at 8.3k, Lymphs abs at 800, Glucose at 212, BUN at 26, Creatinine at 1.07, Total Protein at 6.3, Albumin at 3.1, GFR at 51.  On review of systems, pt reports good energy levels, stable leg swelling, and denies chest wall pain, abdominal pains, fevers, chills, night sweats, concerns for infections, and any other symptoms.    MEDICAL HISTORY:  Past Medical History:  Diagnosis Date  . Acute cholecystitis 11/06/2016  . Atrial fibrillation ([place])   . Cholangitis   . Diabetes mellitus without complication ([place])   . Elevated LFTs   . GERD (gastroesophageal reflux disease)   . Hypertension   . Kidney disease   . Pulmonary nodule   . Rib lesion   . Thyroid nodule     SURGICAL HISTORY: Past Surgical History:  Procedure Laterality Date  . CHOLECYSTECTOMY N/A 11/10/2016   Procedure: LAPAROSCOPIC  CHOLECYSTECTOMY WITH INTRAOPERATIVE CHOLANGIOGRAM;  Surgeon: Stark Klein, MD;  Location: [facility];  Service: General;  Laterality: N/A;    SOCIAL HISTORY: Social History   Socioeconomic History  . Marital status: Married    Spouse name: Not on file  . Number of children: Not on file  . Years of education: Not on file  . Highest education level: Not on file  Occupational History  . Not on file  Social Needs  . Financial resource strain: Not on file  . Food insecurity:    Worry: Not on file    Inability: Not on file  . Transportation needs:    Medical: Not on file    Non-medical: Not on file  Tobacco Use  . Smoking status: Never Smoker  . Smokeless tobacco: Never Used  Substance and Sexual Activity  . Alcohol use: No  . Drug use: No  . Sexual activity: Not on file  Lifestyle  . Physical activity:    Days per week: Not on file    Minutes per session: Not on file  . Stress: Not on file  Relationships  . Social connections:    Talks on phone: Not on file    Gets together: Not on file    Attends religious service: Not on file    Active member of club or organization: Not on file  Attends meetings of clubs or organizations: Not on file    Relationship status: Not on file  . Intimate partner violence:    Fear of current or ex partner: Not on file    Emotionally abused: Not on file    Physically abused: Not on file    Forced sexual activity: Not on file  Other Topics Concern  . Not on file  Social History Narrative   Lives with spouse in [place].    FAMILY HISTORY: Family History  Problem Relation Age of Onset  . Stroke Mother   . Diabetes Mother   . Hypertension Mother   . Chronic Renal Failure Father   . Cancer Brother        esophageal  . Melanoma Brother     ALLERGIES:  is allergic to metformin and related and statins.  MEDICATIONS:  Current Outpatient Medications  Medication Sig Dispense Refill  . carvedilol (COREG) 25 MG tablet TAKE 1 TABLET BY  MOUTH TWICE DAILY WITH A MEAL 180 tablet 0  . furosemide (LASIX) 20 MG tablet Take 1 tablet (20 mg total) by mouth daily. 30 tablet 5  . glimepiride (AMARYL) 4 MG tablet Take 4 mg by mouth daily before breakfast.     . lisinopril (PRINIVIL,ZESTRIL) 2.5 MG tablet Take 1 tablet (2.5 mg total) by mouth daily. 30 tablet 5  . NIFEdipine (ADALAT CC) 90 MG 24 hr tablet Take 90 mg by mouth daily.    Marland Kitchen PREDNISONE PO Take 14 mg by mouth.     . RABEprazole (ACIPHEX) 20 MG tablet Take 20 mg by mouth 2 (two) times daily.     . Rivaroxaban (XARELTO PO) Take by mouth.     No current facility-administered medications for this visit.     REVIEW OF SYSTEMS:    A 10+ POINT REVIEW OF SYSTEMS WAS OBTAINED including neurology, dermatology, psychiatry, cardiac, respiratory, lymph, extremities, GI, GU, Musculoskeletal, constitutional, breasts, reproductive, HEENT.  All pertinent positives are noted in the HPI.  All others are negative.   PHYSICAL EXAMINATION: ECOG PERFORMANCE STATUS: 1 - Symptomatic but completely ambulatory  . Vitals:   12/10/17 0928  BP: (!) 155/75  Pulse: 88  Resp: 17  Temp: 97.7 F (36.5 C)  SpO2: 98%   Filed Weights   12/10/17 0928  Weight: 185 lb 4.8 oz (84.1 kg)   .Body mass index is 36.19 kg/m.  GENERAL:alert, in no acute distress and comfortable SKIN: no acute rashes, no significant lesions EYES: conjunctiva are pink and non-injected, sclera anicteric OROPHARYNX: MMM, no exudates, no oropharyngeal erythema or ulceration NECK: supple, no JVD. Palpable cyst is felt within the right neck.  LYMPH:  no palpable lymphadenopathy in the cervical, axillary or inguinal regions CHEST: palpable mass on the left mid-chest anteriorly  LUNGS: clear to auscultation b/l with normal respiratory effort HEART: regular rate & rhythm ABDOMEN:  normoactive bowel sounds , non tender, not distended. No palpable hepatosplenomegaly.  Extremity: 1+ pedal edema bilaterally  PSYCH: alert &  oriented x 3 with fluent speech NEURO: no focal motor/sensory deficits   LABORATORY DATA:  I have reviewed the data as listed  . CBC Latest Ref Rng & Units 12/10/2017 06/11/2017 02/03/2017  WBC 3.9 - 10.3 K/uL 9.5 8.2 11.2(H)  Hemoglobin 11.6 - 15.9 g/dL 10.4(L) 11.2(L) 12.0  Hematocrit 34.8 - 46.6 % 34.5(L) 36.0 36.9  Platelets 145 - 400 K/uL 278 308 400  HGB 11.2  . CMP Latest Ref Rng & Units 12/10/2017 06/11/2017 02/03/2017  Glucose  70 - 99 mg/dL 212(H) 116 109(H)  BUN 8 - 23 mg/dL 26(H) 18 26(H)  Creatinine 0.44 - 1.00 mg/dL 1.07(H) 0.92 1.08(H)  Sodium 135 - 145 mmol/L 142 141 139  Potassium 3.5 - 5.1 mmol/L 3.7 3.6 3.6  Chloride 98 - 111 mmol/L 104 105 102  CO2 22 - 32 mmol/L 28 27 27   Calcium 8.9 - 10.3 mg/dL 9.0 9.3 9.3  Total Protein 6.5 - 8.1 g/dL 6.3(L) 7.0 -  Total Bilirubin 0.3 - 1.2 mg/dL 0.3 0.3 -  Alkaline Phos 38 - 126 U/L 104 117 -  AST 15 - 41 U/L 15 13 -  ALT 0 - 44 U/L 20 10 -   . Lab Results  Component Value Date   LDH 242 01/12/2017         RADIOGRAPHIC STUDIES: I have personally reviewed the radiological images as listed and agreed with the findings in the report. Ct Chest Wo Contrast  Result Date: 11/26/2017 CLINICAL DATA:  Followup of pulmonary nodule.  Cough. EXAM: CT CHEST WITHOUT CONTRAST TECHNIQUE: Multidetector CT imaging of the chest was performed following the standard protocol without IV contrast. COMPARISON:  06/04/2016.  PET of 12/10/2016. FINDINGS: Cardiovascular: Tortuous thoracic aorta. Moderate cardiomegaly, without pericardial effusion. Multivessel coronary artery atherosclerosis. Pulmonary artery enlargement, outflow tract 3.3 cm Mediastinum/Nodes: Cystic lesion within the right lobe of the thyroid is similar, including at 3.2 cm. No mediastinal or definite hilar adenopathy, given limitations of unenhanced CT. Lungs/Pleura: No pleural fluid. Right lower lobe pulmonary nodule of 11 x 8 mm on image 88/5, similar. Just cephalad, 4 mm right  lower lobe nodule on image 84/5 is also not significantly changed. Minimal more inferior right lower lobe nodularity at approximately 4 mm on image 111/5, unchanged. Upper Abdomen: Cholecystectomy. Normal imaged portions of the liver, spleen, stomach, right adrenal gland, kidneys. Left adrenal adenoma at 1.4 cm. Low-density pancreatic body lesion measures 1.5 cm on image 161/2, incompletely imaged. Musculoskeletal: Expansile anterior left fifth rib lesion measures 3.7 x 2.6 cm on image 89/2 versus 3.6 x 2.7 cm on the prior exam. Lower thoracic and upper lumbar spondylosis. IMPRESSION: 1. Similar right lower lobe pulmonary nodules, including a dominant 11 mm nodule. Stability for approximately 1 year has been confirmed. Per consensus criteria, a total of 2 years of stability is required. Recommend CT follow-up at 6-12 months. 2. Similar appearance of anterior left fifth rib lesion with differential considerations of enchondroma versus low-grade chondrosarcoma. 3. Cystic lesion within the pancreatic body is incompletely imaged. This may be new or enlarged since the prior MRI. Recommend follow-up with abdominal pre and postcontrast MRCP/MRI at approximately 1 year. Please see 11/07/2016 MRI report for further discussion. 4. Pulmonary artery enlargement suggests pulmonary arterial hypertension. Electronically Signed   By: Abigail Miyamoto M.D.   On: 11/26/2017 09:56     PET/CT 12/10/2016: IMPRESSION: 1. Right lower lobe pulmonary nodule is not hypermetabolic. Recommend followup noncontrast chest CT in 6 months. 2. Left fifth anterior rib lesion appears to be a chondroid lesion with low metabolic activity. 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 Biopsy versus close surveillance. 3. Cystic lesion involving the right thyroid lobe is not hypermetabolic. Ultrasound follow-up suggested. 4. Hypermetabolism in the liver around the gallbladder fossa likely from recent surgery. 5. Diffuse GI uptake, likely  physiologic.   Electronically Signed   By: Marijo Sanes M.D.   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  Multiple small less than 1 cm cysts centered in the renal medulla bilaterally. Nonspecific could be related to congenital cystic kidney disease, medullary cystic kidney disease or medullary sponge kidney. No associated calcifications.  1.6 cm left adrenal adenoma.  Sigmoid diverticulosis, without diverticulitis.  Cholelithiasis.     US thyroid 01/23/2017: IMPRESSION: 1. No significant change in a predominantly cystic nodule in the right thyroid lobe measuring 3.9 cm.   ASSESSMENT & PLAN:   This is a wonderful 72 y.o. female who presents today to discuss:   1) Indeterminate left rib lesion -I discussed the 12/10/16 PET/CT findings with patient in great detail today. Radiology felt that this was possibly a chondroid lesion, enchondroma versus low-grade chondrosarcoma could not be ruled out. No other bony sites of pathology were identified.  -Discussed the findings of the most recent 06/04/17 CT reveals that the left rib expansile mass has not changed in size and that her pulmonary nodule in the right lower lobe is also stable.  -Rib mass appears to be a benign lesion such as a an enchondroma   2) Mesenteric and retroperitoneal lymph nodes and diffuse uptake within the GI tract -- appear reactive ? Inflammation.  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  PLAN -Myeloma panel and SFLC done and show no overt evidence of monoclonal paraproteinemia to suggest Myeloma. -CT guided biopsy -  indeterminate. -Recommended sports compression socks for her leg swelling that would likely be more comfortable than her previous compression socks  -Discussed pt labwork today, 12/10/17; blood counts and chemistries are stable  -Discussed the 11/26/17 CT Chest which revealed Similar right lower lobe pulmonary nodules, including a dominant 11 mm nodule. Stability for approximately 1 year has been confirmed. Per consensus criteria, a total of 2 years of stability is required. Recommend CT follow-up at 6-12 months. 2. Similar appearance of anterior left fifth rib lesion with differential considerations of enchondroma versus low-grade Chondrosarcoma. 3. Cystic lesion within the pancreatic body is incompletely imaged. This may be new or enlarged since the prior MRI. Recommend follow-up with abdominal pre and postcontrast MRCP/MRI at approximately 1 year. Please see 11/07/2016 MRI report for further discussion. 4. Pulmonary artery enlargement suggests pulmonary arterial hypertension.  -Discussed the indication that this is most likely an enchondroma  -Will watch pulmonary nodule with repeat imaging in 6-7 months  -Recommend that the pt follow up with GI regarding her pancreatic cyst, and she has an appointment with Dr. Loletha Carrow in GI later this month, will defer to GI for imaging on pancreatic cyst -Discussed continuing to watch these concerns vs biopsies, and the pt prefers the former which is reasonable at this time given radiographic, clinical, and lab stability  -Will see the pt back in 6 months with repeat imaging     CT chest in 24 weeks  RTC with Dr Irene Limbo in 6 months    All of the patients questions were answered with apparent satisfaction. The patient knows to call the clinic with any problems, questions or concerns.  The total time spent in the appt was 20 minutes and more than 50% was on counseling and direct patient cares.     Sullivan Lone MD MS AAHIVMS [place]  Hematology/Oncology  Physician [facility]  (Office):       [phone] (Work cell):  [phone] (Fax):           [phone]  12/10/2017 9:55 AM  I, Baldwin Jamaica, am acting as a scribe for Dr. Irene Limbo  .I have reviewed the above documentation for accuracy and completeness, and I agree with the above. Brunetta Genera MD

## 2017-12-10 ENCOUNTER — Inpatient Hospital Stay: Payer: Medicare Other | Attending: Hematology

## 2017-12-10 ENCOUNTER — Telehealth: Payer: Self-pay | Admitting: Hematology

## 2017-12-10 ENCOUNTER — Inpatient Hospital Stay (HOSPITAL_BASED_OUTPATIENT_CLINIC_OR_DEPARTMENT_OTHER): Payer: Medicare Other | Admitting: Hematology

## 2017-12-10 ENCOUNTER — Encounter: Payer: Self-pay | Admitting: Hematology

## 2017-12-10 DIAGNOSIS — R918 Other nonspecific abnormal finding of lung field: Secondary | ICD-10-CM | POA: Diagnosis not present

## 2017-12-10 DIAGNOSIS — N289 Disorder of kidney and ureter, unspecified: Secondary | ICD-10-CM | POA: Insufficient documentation

## 2017-12-10 DIAGNOSIS — K862 Cyst of pancreas: Secondary | ICD-10-CM | POA: Diagnosis not present

## 2017-12-10 DIAGNOSIS — M954 Acquired deformity of chest and rib: Secondary | ICD-10-CM

## 2017-12-10 DIAGNOSIS — R911 Solitary pulmonary nodule: Secondary | ICD-10-CM

## 2017-12-10 DIAGNOSIS — M859 Disorder of bone density and structure, unspecified: Secondary | ICD-10-CM

## 2017-12-10 DIAGNOSIS — R222 Localized swelling, mass and lump, trunk: Secondary | ICD-10-CM

## 2017-12-10 LAB — CBC WITH DIFFERENTIAL (CANCER CENTER ONLY)
Basophils Absolute: 0 10*3/uL (ref 0.0–0.1)
Basophils Relative: 0 %
EOS PCT: 0 %
Eosinophils Absolute: 0 10*3/uL (ref 0.0–0.5)
HEMATOCRIT: 34.5 % — AB (ref 34.8–46.6)
Hemoglobin: 10.4 g/dL — ABNORMAL LOW (ref 11.6–15.9)
Lymphocytes Relative: 9 %
Lymphs Abs: 0.8 10*3/uL — ABNORMAL LOW (ref 0.9–3.3)
MCH: 26.5 pg (ref 25.1–34.0)
MCHC: 30.1 g/dL — ABNORMAL LOW (ref 31.5–36.0)
MCV: 88 fL (ref 79.5–101.0)
Monocytes Absolute: 0.4 10*3/uL (ref 0.1–0.9)
Monocytes Relative: 4 %
Neutro Abs: 8.3 10*3/uL — ABNORMAL HIGH (ref 1.5–6.5)
Neutrophils Relative %: 87 %
Platelet Count: 278 10*3/uL (ref 145–400)
RBC: 3.92 MIL/uL (ref 3.70–5.45)
RDW: 16.7 % — ABNORMAL HIGH (ref 11.2–14.5)
WBC: 9.5 10*3/uL (ref 3.9–10.3)

## 2017-12-10 LAB — CMP (CANCER CENTER ONLY)
ALBUMIN: 3.1 g/dL — AB (ref 3.5–5.0)
ALK PHOS: 104 U/L (ref 38–126)
ALT: 20 U/L (ref 0–44)
AST: 15 U/L (ref 15–41)
Anion gap: 10 (ref 5–15)
BILIRUBIN TOTAL: 0.3 mg/dL (ref 0.3–1.2)
BUN: 26 mg/dL — AB (ref 8–23)
CALCIUM: 9 mg/dL (ref 8.9–10.3)
CO2: 28 mmol/L (ref 22–32)
Chloride: 104 mmol/L (ref 98–111)
Creatinine: 1.07 mg/dL — ABNORMAL HIGH (ref 0.44–1.00)
GFR, Est AFR Am: 59 mL/min — ABNORMAL LOW (ref >60)
GFR, Estimated: 51 mL/min — ABNORMAL LOW (ref >60)
GLUCOSE: 212 mg/dL — AB (ref 70–99)
Potassium: 3.7 mmol/L (ref 3.5–5.1)
SODIUM: 142 mmol/L (ref 135–145)
TOTAL PROTEIN: 6.3 g/dL — AB (ref 6.5–8.1)

## 2017-12-10 LAB — RETICULOCYTES
RBC.: 3.92 MIL/uL (ref 3.70–5.45)
Retic Count, Absolute: 66.6 10*3/uL (ref 33.7–90.7)
Retic Ct Pct: 1.7 % (ref 0.7–2.1)

## 2017-12-10 NOTE — Telephone Encounter (Signed)
Appts scheduled AVS/Calendar printed per 10/4 los °

## 2017-12-15 ENCOUNTER — Ambulatory Visit (INDEPENDENT_AMBULATORY_CARE_PROVIDER_SITE_OTHER): Payer: Medicare Other | Admitting: Internal Medicine

## 2017-12-15 ENCOUNTER — Encounter: Payer: Self-pay | Admitting: Internal Medicine

## 2017-12-15 VITALS — BP 116/62 | HR 87 | Ht 60.0 in | Wt 183.2 lb

## 2017-12-15 DIAGNOSIS — I10 Essential (primary) hypertension: Secondary | ICD-10-CM

## 2017-12-15 DIAGNOSIS — I480 Paroxysmal atrial fibrillation: Secondary | ICD-10-CM

## 2017-12-15 NOTE — Progress Notes (Signed)
Electrophysiology Office Note   Date:  12/15/2017   ID:  Tara Serrano, DOB [birth year], MRN [id number]  PCP:  Ivan Anchors, MD    Primary Electrophysiologist: Thompson Grayer, MD    CC: afib   History of Present Illness: Tara Serrano is a 72 y.o. female who presents today for electrophysiology evaluation.   I saw her in consultation for AF 11/08/16 in the hospital when she had new onset afib. She has seen Tommye Standard once 11/25/16 but has not seen [place] since. Her EF had recovered on follow-up echo 12/18.  She reports having extreme fatigue, nausea, and myalgias for several months.  She was eventually diagnosed with PMR and started on prednisone.  Since then, her symptoms have completely resolved.  She states "I have more energy now than Ive had my entire life".  She is in afib today but unaware.  Today, she denies symptoms of palpitations, chest pain, shortness of breath, orthopnea, PND, claudication, dizziness, presyncope, syncope, bleeding, or neurologic sequela. Chronic edema is stable.  The patient is tolerating medications without difficulties and is otherwise without complaint today.    Past Medical History:  Diagnosis Date  . Acute cholecystitis 11/06/2016  . Atrial fibrillation ([place])   . Cholangitis   . Diabetes mellitus without complication ([place])   . Elevated LFTs   . GERD (gastroesophageal reflux disease)   . Hypertension   . Kidney disease   . Pulmonary nodule   . Rib lesion   . Thyroid nodule    Past Surgical History:  Procedure Laterality Date  . CHOLECYSTECTOMY N/A 11/10/2016   Procedure: LAPAROSCOPIC CHOLECYSTECTOMY WITH INTRAOPERATIVE CHOLANGIOGRAM;  Surgeon: Stark Klein, MD;  Location: [facility];  Service: General;  Laterality: N/A;     Current Outpatient Medications  Medication Sig Dispense Refill  . carvedilol (COREG) 25 MG tablet TAKE 1 TABLET BY MOUTH TWICE DAILY WITH A MEAL 180 tablet 0  . glimepiride (AMARYL) 4 MG tablet Take 4 mg by mouth daily before  breakfast.     . NIFEdipine (ADALAT CC) 90 MG 24 hr tablet Take 90 mg by mouth daily.    Marland Kitchen PREDNISONE PO Take 14 mg by mouth.     . RABEprazole (ACIPHEX) 20 MG tablet Take 20 mg by mouth 2 (two) times daily.     . Rivaroxaban (XARELTO PO) Take by mouth.    . furosemide (LASIX) 20 MG tablet Take 1 tablet (20 mg total) by mouth daily. 30 tablet 5  . lisinopril (PRINIVIL,ZESTRIL) 2.5 MG tablet Take 1 tablet (2.5 mg total) by mouth daily. 30 tablet 5   No current facility-administered medications for this visit.     Allergies:   Metformin and related and Statins   Social History:  The patient  reports that she has never smoked. She has never used smokeless tobacco. She reports that she does not drink alcohol or use drugs.   Family History:  The patient's  family history includes Cancer in her brother; Chronic Renal Failure in her father; Diabetes in her mother; Hypertension in her mother; Melanoma in her brother; Stroke in her mother.    ROS:  Please see the history of present illness.   All other systems are personally reviewed and negative.    PHYSICAL EXAM: VS:  BP 116/62   Pulse 87   Ht 5' (1.524 m)   Wt 183 lb 3.2 oz (83.1 kg)   SpO2 96%   BMI 35.78 kg/m  , BMI Body mass index is 35.78  kg/m. GEN: Well nourished, well developed, in no acute distress  HEENT: normal  Neck: no JVD, carotid bruits, or masses Cardiac: iRRR; no murmurs, rubs, or gallops + edema  Respiratory:  clear to auscultation bilaterally, normal work of breathing GI: soft, nontender, nondistended, + BS MS: no deformity or atrophy  Skin: warm and dry  Neuro:  Strength and sensation are intact Psych: euthymic mood, full affect  EKG:  EKG is ordered today. The ekg ordered today is personally reviewed and shows afib, V rate 87 bpm, ashemans beats noted, RBBB, QTc 490 msec   Recent Labs: 12/10/2017: ALT 20; BUN 26; Creatinine 1.07; Hemoglobin 10.4; Platelet Count 278; Potassium 3.7; Sodium 142  personally  reviewed   Lipid Panel  No results found for: CHOL, TRIG, HDL, CHOLHDL, VLDL, LDLCALC, LDLDIRECT personally reviewed   Wt Readings from Last 3 Encounters:  12/15/17 183 lb 3.2 oz (83.1 kg)  12/10/17 185 lb 4.8 oz (84.1 kg)  10/29/17 178 lb (80.7 kg)      Other studies personally reviewed: Additional studies/ records that were reviewed today include: My consult from 2018,. EP PA note from 2018  Review of the above records today demonstrates: as above  Echo 02/22/17 revealed EF 60%   ASSESSMENT AND PLAN:  1.  Persistent afib In afib today but unaware chads2vasc score is 4.  On xarelto We discussed rate control vs rhythm control strategies.  She is very clear that she does not wish to consider AADs or pursue sinus rhythm at this time. Given paucity of symptoms, I think rate control is reasonable. Repeat echo upon return   2. HTN Stable No change required today  3. Tachycardia induced CM Resolved previously with sinus rhythm Repeat echo upon return  Follow-up:  Return to see EP PA in 3 months  Current medicines are reviewed at length with the patient today.   The patient does not have concerns regarding her medicines.  The following changes were made today:  none    Signed, Thompson Grayer, MD  12/15/2017 12:24 PM     [address] [place] [id number] [phone] (office) [phone] (fax)

## 2017-12-15 NOTE — Patient Instructions (Signed)
Medication Instructions:  Your physician recommends that you continue on your current medications as directed. Please refer to the Current Medication list given to you today.  If you need a refill on your cardiac medications before your next appointment, please call your pharmacy.   Lab work: None Ordered  If you have labs (blood work) drawn today and your tests are completely normal, you will receive your results only by: Marland Kitchen MyChart Message (if you have MyChart) OR . A paper copy in the mail If you have any lab test that is abnormal or we need to change your treatment, we will call you to review the results.  Testing/Procedures: None ordered  Follow-Up: Your physician recommends that you schedule a follow-up appointment in: 3 months with Tommye Standard, PA  Any Other Special Instructions Will Be Listed Below (If Applicable).

## 2017-12-17 ENCOUNTER — Ambulatory Visit (INDEPENDENT_AMBULATORY_CARE_PROVIDER_SITE_OTHER): Payer: Medicare Other | Admitting: Gastroenterology

## 2017-12-17 ENCOUNTER — Encounter: Payer: Self-pay | Admitting: Gastroenterology

## 2017-12-17 ENCOUNTER — Telehealth: Payer: Self-pay

## 2017-12-17 VITALS — BP 132/76 | HR 88 | Ht 59.25 in | Wt 182.5 lb

## 2017-12-17 DIAGNOSIS — R195 Other fecal abnormalities: Secondary | ICD-10-CM

## 2017-12-17 DIAGNOSIS — Z7901 Long term (current) use of anticoagulants: Secondary | ICD-10-CM

## 2017-12-17 DIAGNOSIS — D500 Iron deficiency anemia secondary to blood loss (chronic): Secondary | ICD-10-CM | POA: Diagnosis not present

## 2017-12-17 DIAGNOSIS — K862 Cyst of pancreas: Secondary | ICD-10-CM | POA: Diagnosis not present

## 2017-12-17 DIAGNOSIS — I480 Paroxysmal atrial fibrillation: Secondary | ICD-10-CM | POA: Diagnosis not present

## 2017-12-17 NOTE — Telephone Encounter (Signed)
Please address xarelto

## 2017-12-17 NOTE — Telephone Encounter (Signed)
Patient with diagnosis of Afib on Xarelto for anticoagulation.    Procedure: EGD/colonoscopy Date of procedure: 01/26/18  CHADS2-VASc score of  4 (CHF, HTN, AGE, DM2, stroke/tia x 2, CAD, AGE, female)  CrCl 74ml/min  Per office protocol, patient can hold Xarelto for 1-2 days prior to procedure.

## 2017-12-17 NOTE — Patient Instructions (Signed)
If you are age 73 or older, your body mass index should be between 23-30. Your Body mass index is 36.55 kg/m. If this is out of the aforementioned range listed, please consider follow up with your Primary Care Provider.  If you are age 93 or younger, your body mass index should be between 19-25. Your Body mass index is 36.55 kg/m. If this is out of the aformentioned range listed, please consider follow up with your Primary Care Provider.   You will be contacted by our office prior to your procedure for directions on holding your Plavix. If you do not hear from our office 1 week prior to your scheduled procedure, please call [phone] to discuss.   You have been scheduled for a colonoscopy. Please follow written instructions given to you at your visit today.  Please pick up your prep supplies at the pharmacy within the next 1-3 days. If you use inhalers (even only as needed), please bring them with you on the day of your procedure. Your physician has requested that you go to www.startemmi.com and enter the access code given to you at your visit today. This web site gives a general overview about your procedure. However, you should still follow specific instructions given to you by our office regarding your preparation for the procedure.  It was a pleasure to see you today!  Dr. Loletha Carrow

## 2017-12-17 NOTE — Progress Notes (Signed)
[facility] Consult Note:  History: Tara Serrano 12/17/2017  Referring physician: Ivan Anchors, MD  Reason for consult/chief complaint: Iron Def Anemia and pancreatic cyst (abnormal CT)   Subjective  HPI:  This is a very pleasant 72 year old woman referred by primary care for iron deficiency anemia and heme positive stool. Primary care note of 10/27/2017 indicates patient has a chronic cough and fatigue.  At some point she was changed from Eliquis to [place].  Labs showed normal LFTs, hemoglobin 10.4 with low MCV, also CKD stage III.  Iron levels reportedly low with normal ferritin.  Daily oral iron caused itching, so she takes it every other day. Fatigue reportedly improved on increased prednisone dose for her PMR.  Was taking Mucinex for the chronic cough. 11/02/2017 office note indicates hemoglobin had improved to 10.1.  Tara Serrano denies chronic rectal bleeding, but had one episode over Labor Day without any change in bowel habits.  She reports her bowel habits is regular, she denies chronic abdominal pain, nausea, vomiting, dysphagia, anorexia or weight loss.  ROS:  Review of Systems  Constitutional: Positive for fatigue. Negative for appetite change and unexpected weight change.  HENT: Negative for mouth sores and voice change.   Eyes: Negative for pain and redness.  Respiratory: Positive for cough. Negative for shortness of breath.   Cardiovascular: Negative for chest pain and palpitations.  Genitourinary: Negative for dysuria and hematuria.  Musculoskeletal: Positive for myalgias. Negative for arthralgias.  Skin: Negative for pallor and rash.  Neurological: Negative for weakness and headaches.  Hematological: Negative for adenopathy.     Past Medical History: Past Medical History:  Diagnosis Date  . Acute cholecystitis 11/06/2016  . Atrial fibrillation ([facility] [place])   . Cholangitis   . Diabetes mellitus without complication ([place])   . Elevated LFTs     . GERD (gastroesophageal reflux disease)   . Hypertension   . Kidney disease   . Pancreatic cyst   . PMR (polymyalgia rheumatica) ([facility])   . Pulmonary nodule   . Rib lesion   . Thyroid nodule      Past Surgical History: Past Surgical History:  Procedure Laterality Date  . CHOLECYSTECTOMY N/A 11/10/2016   Procedure: LAPAROSCOPIC CHOLECYSTECTOMY WITH INTRAOPERATIVE CHOLANGIOGRAM;  Surgeon: Stark Klein, MD;  Location: [facility];  Service: General;  Laterality: N/A;     Family History: Family History  Problem Relation Age of Onset  . Stroke Mother   . Diabetes Mother   . Hypertension Mother   . Chronic Renal Failure Father   . Other Sister        Glioblastoma  . Esophageal cancer Brother   . Melanoma Brother     Social History: Social History   Socioeconomic History  . Marital status: Married    Spouse name: Not on file  . Number of children: 1  . Years of education: Not on file  . Highest education level: Not on file  Occupational History  . Occupation: Medical sales representative  Social Needs  . Financial resource strain: Not on file  . Food insecurity:    Worry: Not on file    Inability: Not on file  . Transportation needs:    Medical: Not on file    Non-medical: Not on file  Tobacco Use  . Smoking status: Never Smoker  . Smokeless tobacco: Never Used  Substance and Sexual Activity  . Alcohol use: No  . Drug use: No  . Sexual activity: Not on file  Lifestyle  . Physical activity:  Days per week: Not on file    Minutes per session: Not on file  . Stress: Not on file  Relationships  . Social connections:    Talks on phone: Not on file    Gets together: Not on file    Attends religious service: Not on file    Active member of club or organization: Not on file    Attends meetings of clubs or organizations: Not on file    Relationship status: Not on file  Other Topics Concern  . Not on file  Social History Narrative   Lives with spouse in [place].     Allergies: Allergies  Allergen Reactions  . Metformin And Related Nausea Only  . Statins Other (See Comments)    Muscle problems    Outpatient Meds: Current Outpatient Medications  Medication Sig Dispense Refill  . carvedilol (COREG) 25 MG tablet TAKE 1 TABLET BY MOUTH TWICE DAILY WITH A MEAL 180 tablet 0  . furosemide (LASIX) 20 MG tablet Take 1 tablet (20 mg total) by mouth daily. 30 tablet 5  . glimepiride (AMARYL) 4 MG tablet Take 6 mg by mouth daily before breakfast.     . lisinopril (PRINIVIL,ZESTRIL) 5 MG tablet Take 5 mg by mouth daily.    Marland Kitchen NIFEdipine (ADALAT CC) 90 MG 24 hr tablet Take 90 mg by mouth daily.    Marland Kitchen PREDNISONE PO Take 13 mg by mouth.     . RABEprazole (ACIPHEX) 20 MG tablet Take 20 mg by mouth 2 (two) times daily.     . Rivaroxaban (XARELTO PO) Take by mouth.     No current facility-administered medications for this visit.       ___________________________________________________________________ Objective   Exam:  BP 132/76 (BP Location: Left Arm, Patient Position: Sitting, Cuff Size: Normal)   Pulse 88   Ht 4' 11.25" (1.505 m) Comment: height measured without shoes  Wt 182 lb 8 oz (82.8 kg)   BMI 36.55 kg/m    General: this is a(n) well-appearing patient  Eyes: sclera anicteric, no redness  ENT: oral mucosa moist without lesions, no cervical or supraclavicular lymphadenopathy, good dentition  CV: Irregular without murmur, S1/S2, no JVD, no peripheral edema  Resp: clear to auscultation bilaterally, normal RR and effort noted  GI: soft, no tenderness, with active bowel sounds. No guarding or palpable organomegaly noted.  Skin; warm and dry, no rash or jaundice noted  Neuro: awake, alert and oriented x 3. Normal gross motor function and fluent speech  Labs:  10/27/2017 labs: WBC 8.9 hemoglobin 9.6 hematocrit 30 MCV 82 RDW 15 platelet 513 Albumin 3.4 creatinine 1.1 BUN 18 GFR 50-60 FOBT reportedly positive.  Radiologic  Studies:  CLINICAL DATA:  Epigastric pain for 1 week. Fever. Suspect infection. History of hypertension and diabetes.   EXAM: CT ABDOMEN AND PELVIS WITH CONTRAST   TECHNIQUE: Multidetector CT imaging of the abdomen and pelvis was performed using the standard protocol following bolus administration of intravenous contrast.   CONTRAST:  152mL ISOVUE-300 IOPAMIDOL (ISOVUE-300) INJECTION 61%   COMPARISON:  Abdominal ultrasound November 06, 2016 at 0458 hours   FINDINGS: LOWER CHEST: 10 mm RIGHT lower lobe pulmonary nodule (axial 2/66, series 5). Hazy ground-glass opacities with interlobular septal thickening and atelectasis. The heart is mildly enlarged. Mild coronary artery calcifications. No pericardial effusion. Retained versus refluxed contrast in distal esophagus.   HEPATOBILIARY: Distended gallbladder. Minimal intrahepatic biliary dilatation. The liver is 24 cm in cranial caudad dimension . Common bile duct is 10  mm without choledocholithiasis.   PANCREAS: 11 mm cystic mass in tail of the pancreas, pancreas is otherwise unremarkable.   SPLEEN: Normal.   ADRENALS/URINARY TRACT: Kidneys are orthotopic, demonstrating symmetric enhancement. 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 Multifocal scarring in too small to characterize hypodensities bilateral kidneys. No nephrolithiasis or hydronephrosis. 12 mm cyst upper pole LEFT kidney. The unopacified ureters are normal in course and caliber. Delayed imaging through the kidneys demonstrates symmetric prompt contrast excretion within the proximal urinary collecting system. Urinary bladder is partially distended and unremarkable. Normal adrenal glands.   STOMACH/BOWEL: The stomach, small and large bowel are normal in course and caliber without inflammatory changes, sensitivity decreased without oral contrast. Moderate descending and sigmoid colonic diverticulosis. The  appendix is not discretely identified, however there are no inflammatory changes in the right lower quadrant.   VASCULAR/LYMPHATIC: Aortoiliac vessels are normal in course and caliber. Mild calcific atherosclerosis. No lymphadenopathy by CT size criteria.   REPRODUCTIVE: Normal.   OTHER: No intraperitoneal free fluid or free air.   MUSCULOSKELETAL: Expanded LEFT anterior rib (image 1/ 66), possible old fracture. Osteopenia. Multilevel moderate to severe degenerative change of the spine.   IMPRESSION: 1. Distended gallbladder with mild biliary dilatation. Findings seen with cholecystitis or, possibly cholangitis. 2. Hepatomegaly. 3. Mild cardiomegaly and suspected pulmonary edema. Recommend chest radiograph. 4. **An incidental finding of potential clinical significance has been found. 10 mm RIGHT lower lobe pulmonary nodule. Consider CT, PET-CT or tissue sampling at 3 months. This recommendation follows the consensus statement: Guidelines for Management of Small Pulmonary Nodules Detected on CT Scans: A Statement from the [place] as published in Radiology 2005; 237:395-400. ** 5. **An incidental finding of potential clinical significance has been found. 11 mm cystic mass tail of pancreas. Recommend re- imaging at 2 years. This recommendation follows ACR consensus guidelines: Management of Incidental Pancreatic Cysts: A White Paper of the ACR Incidental Findings Committee. [place] 2707;86:754-492.** Aortic Atherosclerosis (ICD10-I70.0).     Electronically Signed   By: Elon Alas M.D.   On: 11/06/2016 06:13  (This CT was done when patient admitted for sepsis from cholecystitis)  Assessment: Encounter Diagnoses  Name Primary?  . Iron deficiency anemia due to chronic blood loss Yes  . Heme positive stool   . Pancreatic cyst   . Paroxysmal atrial fibrillation ([facility] [facility])   . Long term current use of anticoagulant     EGD and colonoscopy planned to  work-up anemia and heme positive stool.  I suspect there is at least some element of anemia related to chronic kidney disease. She has a pancreatic cyst discovered incidentally on CT scan August 2018.  Radiologist cites guidelines recommending repeat CT scan at the 2-year interval.  Tara Serrano reports that her oncologist wanted me to know about this.  I recommend primary care follow this with serial imaging as recommended by radiology.  Tara Serrano is agreeable to an upper endoscopy and colonoscopy.  The benefits and risks of the planned procedure were described in detail with the patient or (when appropriate) their health care proxy.  Risks were outlined as including, but not limited to, bleeding, infection, perforation, adverse medication reaction leading to cardiac or pulmonary decompensation, or pancreatitis (if ERCP).  The limitation of incomplete mucosal visualization was also discussed.  No guarantees or warranties were given.  She will stay off oral anticoagulation 2 days prior to procedure given her CKD.  She understands the small but real  risk of stroke while off anticoagulation.  Patient at increased risk for cardiopulmonary complications of procedure due to medical comorbidities.    Thank you for the courtesy of this consult.  Please call me with any questions or concerns.  Nelida Meuse III  CC: Ivan Anchors, MD

## 2017-12-17 NOTE — Telephone Encounter (Signed)
[facility] Pre-operative Risk Assessment     Request for surgical clearance:     Endoscopy Procedure  What type of surgery is being performed?     EGD/Colonoscopy  When is this surgery scheduled?     01-26-2018  What type of clearance is required ?   Pharmacy  Are there any medications that need to be held prior to surgery and how long? Yes, Xarelto, 2 days   Practice name and name of physician performing surgery?      [facility]  What is your office phone and fax number?      Phone- [phone]  [phone]  Anesthesia type (None, local, MAC, general) ?       MAC

## 2017-12-17 NOTE — Telephone Encounter (Signed)
Pt has been notified and aware. She states understanding.

## 2018-01-04 ENCOUNTER — Telehealth: Payer: Self-pay | Admitting: Internal Medicine

## 2018-01-04 NOTE — Telephone Encounter (Signed)
LMTCB

## 2018-01-04 NOTE — Telephone Encounter (Signed)
Pt c/o medication issue:  1. Name of Medication: Xarelto  2. How are you currently taking this medication (dosage and times per day)?  1 tablet once a day  3. Are you having a reaction (difficulty breathing--STAT)? no  4. What is your medication issue? Rectal bleeding started last Thursday, still bleeding. She says it is not much at this time,but she is still bleeding

## 2018-01-05 NOTE — Telephone Encounter (Signed)
Follow up    Patient is returning call in reference to her medication issue   Pt c/o medication issue:  1. Name of Medication: Xarelto  2. How are you currently taking this medication (dosage and times per day)?  1 tablet once a day  3. Are you having a reaction (difficulty breathing--STAT)? no  4. What is your medication issue? Rectal bleeding started last Thursday, still bleeding. She says it is not much at this time,but she is still bleeding

## 2018-01-05 NOTE — Telephone Encounter (Signed)
Returned call to Pt.  Advised Pt to continue her Xarelto while she is worked up by her GI doctor.  Advised Pt was at risk for stroke, etc if she discontinues Xarelto.    Advised Pt to call our office if GI work up was negative for cause of bleeding and she can discuss her options further with Dr. Rayann Heman.  Pt indicates understanding.

## 2018-01-26 ENCOUNTER — Encounter: Payer: Medicare Other | Admitting: Gastroenterology

## 2018-02-14 ENCOUNTER — Other Ambulatory Visit: Payer: Self-pay | Admitting: Internal Medicine

## 2018-03-31 ENCOUNTER — Encounter: Payer: Self-pay | Admitting: Gastroenterology

## 2018-03-31 ENCOUNTER — Telehealth: Payer: Self-pay | Admitting: Gastroenterology

## 2018-03-31 NOTE — Telephone Encounter (Signed)
Pt would like to resched endo-col.  Pt had OV in October with Dr. Loletha Carrow.  Pt is taking Xarelto.  Please advise if pt needs OV or can schedule directly.

## 2018-03-31 NOTE — Telephone Encounter (Signed)
Please see note below and book pt for previsit and direct procedure.

## 2018-03-31 NOTE — Telephone Encounter (Signed)
OK for direct book. Last xarelto dose two days prior to procedure

## 2018-03-31 NOTE — Telephone Encounter (Signed)
Pt had to cancel her previous ECL due to her husband having a car accident. She is calling to reschedule the procedure, she is on a blood thinner. Can pt be scheduled for direct procedure or would you like her to have an OV first? Please advise.

## 2018-04-20 ENCOUNTER — Ambulatory Visit (AMBULATORY_SURGERY_CENTER): Payer: Self-pay | Admitting: *Deleted

## 2018-04-20 ENCOUNTER — Other Ambulatory Visit: Payer: Self-pay

## 2018-04-20 VITALS — Ht 60.0 in | Wt 198.4 lb

## 2018-04-20 DIAGNOSIS — D500 Iron deficiency anemia secondary to blood loss (chronic): Secondary | ICD-10-CM

## 2018-04-20 NOTE — Progress Notes (Signed)
No egg or soy allergy known to patient  No issues with past sedation with any surgeries  or procedures, no intubation problems  No diet pills per patient No home 02 use per patient  Pt denies issues with constipation   A fib or A flutter  EMMI video offered and declined per patient.

## 2018-04-23 NOTE — Progress Notes (Signed)
Electrophysiology Office Note Date: 04/26/2018  ID:  Tara Serrano, DOB 10-29-1945, MRN [id number]  PCP: Ivan Anchors, MD Electrophysiologist: Rayann Heman  CC: AF follow up  Tara Serrano is a 73 y.o. female seen today for Dr Rayann Heman.  She presents today for routine electrophysiology followup.  Since last being seen in our clinic, the patient reports doing relatively well.  She has stable shortness of breath with exertion and LE edema. She is functionally limited by PMR.  She denies chest pain, palpitations, PND, orthopnea, nausea, vomiting, dizziness, syncope, weight gain, or early satiety.  Past Medical History:  Diagnosis Date  . Acute cholecystitis 11/06/2016  . Allergy   . Anemia   . Atrial fibrillation ([place])   . Cataract   . Cholangitis   . Diabetes mellitus without complication ([place])   . Elevated LFTs   . GERD (gastroesophageal reflux disease)   . Hyperlipidemia   . Hypertension   . Kidney disease   . Neuromuscular disorder ([facility])    polymyalgia rheumatica  . Pancreatic cyst   . PMR (polymyalgia rheumatica) ([facility])   . Pulmonary nodule   . Rib lesion   . Thyroid nodule    Past Surgical History:  Procedure Laterality Date  . CHOLECYSTECTOMY N/A 11/10/2016   Procedure: LAPAROSCOPIC CHOLECYSTECTOMY WITH INTRAOPERATIVE CHOLANGIOGRAM;  Surgeon: Stark Klein, MD;  Location: [facility];  Service: General;  Laterality: N/A;  . COLONOSCOPY      Current Outpatient Medications  Medication Sig Dispense Refill  . carvedilol (COREG) 25 MG tablet TAKE 1 TABLET BY MOUTH TWICE DAILY WITH A MEAL 180 tablet 3  . ferrous sulfate 325 (65 FE) MG tablet Take 325 mg by mouth daily with breakfast.    . furosemide (LASIX) 20 MG tablet Take 1 tablet (20 mg total) by mouth daily. 30 tablet 5  . glimepiride (AMARYL) 4 MG tablet Take 6 mg by mouth daily before breakfast.     . lisinopril (PRINIVIL,ZESTRIL) 5 MG tablet Take 5 mg by mouth daily.    Marland Kitchen NIFEdipine (ADALAT CC) 90 MG 24 hr tablet Take  90 mg by mouth daily.    Marland Kitchen PREDNISONE PO Take 10 mg by mouth.     . RABEprazole (ACIPHEX) 20 MG tablet Take 20 mg by mouth 2 (two) times daily.     . Rivaroxaban (XARELTO PO) Take by mouth.     No current facility-administered medications for this visit.     Allergies:   Metformin and related and Statins   Social History: Social History   Socioeconomic History  . Marital status: Married    Spouse name: Not on file  . Number of children: 1  . Years of education: Not on file  . Highest education level: Not on file  Occupational History  . Occupation: Medical sales representative  Social Needs  . Financial resource strain: Not on file  . Food insecurity:    Worry: Not on file    Inability: Not on file  . Transportation needs:    Medical: Not on file    Non-medical: Not on file  Tobacco Use  . Smoking status: Never Smoker  . Smokeless tobacco: Never Used  Substance and Sexual Activity  . Alcohol use: No  . Drug use: No  . Sexual activity: Not on file  Lifestyle  . Physical activity:    Days per week: Not on file    Minutes per session: Not on file  . Stress: Not on file  Relationships  . Social  connections:    Talks on phone: Not on file    Gets together: Not on file    Attends religious service: Not on file    Active member of club or organization: Not on file    Attends meetings of clubs or organizations: Not on file    Relationship status: Not on file  . Intimate partner violence:    Fear of current or ex partner: Not on file    Emotionally abused: Not on file    Physically abused: Not on file    Forced sexual activity: Not on file  Other Topics Concern  . Not on file  Social History Narrative   Lives with spouse in [place].    Family History: Family History  Problem Relation Age of Onset  . Stroke Mother   . Diabetes Mother   . Hypertension Mother   . Chronic Renal Failure Father   . Other Sister        Glioblastoma  . Esophageal cancer Brother   . Melanoma Brother    . Colon cancer Neg Hx   . Colon polyps Neg Hx   . Rectal cancer Neg Hx   . Stomach cancer Neg Hx     Review of Systems: All other systems reviewed and are otherwise negative except as noted above.   Physical Exam: VS:  BP (!) 142/70   Pulse 84   Ht 5' (1.524 m)   Wt 197 lb (89.4 kg)   BMI 38.47 kg/m  , BMI Body mass index is 38.47 kg/m. Wt Readings from Last 3 Encounters:  04/26/18 197 lb (89.4 kg)  04/20/18 198 lb 6.4 oz (90 kg)  12/17/17 182 lb 8 oz (82.8 kg)    GEN- The patient is obese appearing, alert and oriented x 3 today.   HEENT: normocephalic, atraumatic; sclera clear, conjunctiva pink; hearing intact; oropharynx clear; neck supple  Lungs- Clear to ausculation bilaterally, normal work of breathing.  No wheezes, rales, rhonchi Heart- Irregular rate and rhythm  GI- soft, non-tender, non-distended, bowel sounds present  Extremities- no clubbing, cyanosis, + dependent BLE edema MS- no significant deformity or atrophy Skin- warm and dry, no rash or lesion  Psych- euthymic mood, full affect Neuro- strength and sensation are intact   EKG:  EKG is not ordered today.  Recent Labs: 12/10/2017: ALT 20; BUN 26; Creatinine 1.07; Hemoglobin 10.4; Platelet Count 278; Potassium 3.7; Sodium 142    Other studies Reviewed: Additional studies/ records that were reviewed today include: Dr Jackalyn Lombard office notes   Assessment and Plan:  1.  Persistent atrial fibrillation Asymptomatic Continue Xarelto for CHADS2VASC of 4 Will update echo with prior cardiomyopathy CBC, BMET from 10/19 reviewed  2.  HTN Stable No change required today   Current medicines are reviewed at length with the patient today.   The patient does not have concerns regarding her medicines.  The following changes were made today:  none  Labs/ tests ordered today include: echo No orders of the defined types were placed in this encounter.    Disposition:   Follow up with Dr Rayann Heman 6 months       Signed, Chanetta Marshall, NP 04/26/2018 8:16 AM   [facility] [address] [place] [address] [phone] (office) [phone] (fax)

## 2018-04-26 ENCOUNTER — Ambulatory Visit (INDEPENDENT_AMBULATORY_CARE_PROVIDER_SITE_OTHER): Payer: Medicare Other | Admitting: Nurse Practitioner

## 2018-04-26 VITALS — BP 142/70 | HR 84 | Ht 60.0 in | Wt 197.0 lb

## 2018-04-26 DIAGNOSIS — I4819 Other persistent atrial fibrillation: Secondary | ICD-10-CM

## 2018-04-26 DIAGNOSIS — I10 Essential (primary) hypertension: Secondary | ICD-10-CM | POA: Diagnosis not present

## 2018-04-26 NOTE — Patient Instructions (Addendum)
Medication Instructions:   Your physician recommends that you continue on your current medications as directed. Please refer to the Current Medication list given to you today.   If you need a refill on your cardiac medications before your next appointment, please call your pharmacy.   Lab work: NONE ORDERED  TODAY    If you have labs (blood work) drawn today and your tests are completely normal, you will receive your results only by: Marland Kitchen MyChart Message (if you have MyChart) OR . A paper copy in the mail If you have any lab test that is abnormal or we need to change your treatment, we will call you to review the results.  Testing/Procedures: Your physician has requested that you have an echocardiogram. Echocardiography is a painless test that uses sound waves to create images of your heart. It provides your doctor with information about the size and shape of your heart and how well your heart's chambers and valves are working. This procedure takes approximately one hour. There are no restrictions for this procedure.   Follow-Up: At [facility], you and your health needs are our priority.  As part of our continuing mission to provide you with exceptional heart care, we have created designated Provider Care Teams.  These Care Teams include your primary Cardiologist (physician) and Advanced Practice Providers (APPs -  Physician Assistants and Nurse Practitioners) who all work together to provide you with the care you need, when you need it. You will need a follow up appointment in 6 months.  Please call our office 2 months in advance to schedule this appointment.  You may see  Dr.Allred  or one of the following Advanced Practice Providers on your designated Care Team:   Chanetta Marshall, NP . Tommye Standard, PA-C  Any Other Special Instructions Will Be Listed Below (If Applicable).

## 2018-05-04 ENCOUNTER — Ambulatory Visit (AMBULATORY_SURGERY_CENTER): Payer: Medicare Other | Admitting: Gastroenterology

## 2018-05-04 ENCOUNTER — Encounter: Payer: Self-pay | Admitting: Certified Registered Nurse Anesthetist

## 2018-05-04 VITALS — BP 166/67 | HR 88 | Temp 96.4°F | Resp 16 | Ht 60.0 in | Wt 197.0 lb

## 2018-05-04 DIAGNOSIS — K5140 Inflammatory polyps of colon without complications: Secondary | ICD-10-CM | POA: Diagnosis not present

## 2018-05-04 DIAGNOSIS — D123 Benign neoplasm of transverse colon: Secondary | ICD-10-CM

## 2018-05-04 DIAGNOSIS — K317 Polyp of stomach and duodenum: Secondary | ICD-10-CM

## 2018-05-04 DIAGNOSIS — R195 Other fecal abnormalities: Secondary | ICD-10-CM

## 2018-05-04 DIAGNOSIS — K648 Other hemorrhoids: Secondary | ICD-10-CM

## 2018-05-04 DIAGNOSIS — D509 Iron deficiency anemia, unspecified: Secondary | ICD-10-CM

## 2018-05-04 MED ORDER — FAMOTIDINE 20 MG PO TABS: 40.0000 mg | ORAL_TABLET | Freq: Two times a day (BID) | ORAL | 3 refills | Status: DC

## 2018-05-04 MED ORDER — SODIUM CHLORIDE 0.9 % IV SOLN: 500.0000 mL | Freq: Once | INTRAVENOUS | Status: DC

## 2018-05-04 NOTE — Progress Notes (Signed)
Pt's states no medical or surgical changes since previsit or office visit. 

## 2018-05-04 NOTE — Progress Notes (Signed)
Called to room to assist during endoscopic procedure.  Patient ID and intended procedure confirmed with present staff. Received instructions for my participation in the procedure from the performing physician.  

## 2018-05-04 NOTE — Patient Instructions (Signed)
Resume Xarelto at prior dose in 2 days (Friday) Return to primary care doctor to monitor and treat anemia Stop using Aciphex (Rabeprazole) Famotidine 40 mg twice daily has been ordered Records sent to primary care and hematologist/oncologist- ? IV Iron treatment Repeat colonoscopy based on pathology results (2-3 weeks to get results)  YOU HAD AN ENDOSCOPIC PROCEDURE TODAY AT [facility]:   Refer to the procedure report that was given to you for any specific questions about what was found during the examination.  If the procedure report does not answer your questions, please call your gastroenterologist to clarify.  If you requested that your care partner not be given the details of your procedure findings, then the procedure report has been included in a sealed envelope for you to review at your convenience later.  YOU SHOULD EXPECT: Some feelings of bloating in the abdomen. Passage of more gas than usual.  Walking can help get rid of the air that was put into your GI tract during the procedure and reduce the bloating. If you had a lower endoscopy (such as a colonoscopy or flexible sigmoidoscopy) you may notice spotting of blood in your stool or on the toilet paper. If you underwent a bowel prep for your procedure, you may not have a normal bowel movement for a few days.  Please Note:  You might notice some irritation and congestion in your nose or some drainage.  This is from the oxygen used during your procedure.  There is no need for concern and it should clear up in a day or so.  SYMPTOMS TO REPORT IMMEDIATELY:   Following lower endoscopy (colonoscopy or flexible sigmoidoscopy):  Excessive amounts of blood in the stool  Significant tenderness or worsening of abdominal pains  Swelling of the abdomen that is new, acute  Fever of 100F or higher   Following upper endoscopy (EGD)  Vomiting of blood or coffee ground material  New chest pain or pain under the shoulder  blades  Painful or persistently difficult swallowing  New shortness of breath  Fever of 100F or higher  Black, tarry-looking stools  For urgent or emergent issues, a gastroenterologist can be reached at any hour by calling [phone].   DIET:  We do recommend a small meal at first, but then you may proceed to your regular diet.  Drink plenty of fluids but you should avoid alcoholic beverages for 24 hours.  ACTIVITY:  You should plan to take it easy for the rest of today and you should NOT DRIVE or use heavy machinery until tomorrow (because of the sedation medicines used during the test).    FOLLOW UP: Our staff will call the number listed on your records the next business day following your procedure to check on you and address any questions or concerns that you may have regarding the information given to you following your procedure. If we do not reach you, we will leave a message.  However, if you are feeling well and you are not experiencing any problems, there is no need to return our call.  We will assume that you have returned to your regular daily activities without incident.  If any biopsies were taken you will be contacted by phone or by letter within the next 1-3 weeks.  Please call us at [phone] if you have not heard about the biopsies in 3 weeks.    SIGNATURES/CONFIDENTIALITY: You and/or your care partner have signed paperwork which will be entered into your  electronic medical record.  These signatures attest to the fact that that the information above on your After Visit Summary has been reviewed and is understood.  Full responsibility of the confidentiality of this discharge information lies with you and/or your care-partner.

## 2018-05-04 NOTE — Op Note (Signed)
[facility]ara Serr[facility] Patient Name: Tara Serrano Procedure Date: 05/04/2018 9:08 AM MRN: [id number] Endoscopist: Mallie Mussel L. Tara Serrano , MD Age: 73 Referring MD:  Date of Birth: December 09, 1945 Gender: Female Account #: [id number] Procedure:                Colonoscopy Indications:              Heme positive stool, Iron deficiency anemia                            secondary to chronic blood loss Medicines:                Monitored Anesthesia Care Procedure:                Pre-Anesthesia Assessment:                           - Prior to the procedure, a History and Physical                            was performed, and patient medications and                            allergies were reviewed. The patient's tolerance of                            previous anesthesia was also reviewed. The risks                            and benefits of the procedure and the sedation                            options and risks were discussed with the patient.                            All questions were answered, and informed consent                            was obtained. Prior Anticoagulants: The patient has                            taken Xarelto (rivaroxaban), last dose was 2 days                            prior to procedure. ASA Grade Assessment: III - A                            patient with severe systemic disease. After                            reviewing the risks and benefits, the patient was                            deemed in satisfactory condition to undergo the  procedure.                           After obtaining informed consent, the colonoscope                            was passed under direct vision. Throughout the                            procedure, the patient's blood pressure, pulse, and                            oxygen saturations were monitored continuously. The                            Colonoscope was introduced through the anus and             advanced to the the cecum, identified by                            appendiceal orifice and ileocecal valve. The                            colonoscopy was performed without difficulty. The                            patient tolerated the procedure well. The quality                            of the bowel preparation was excellent. The                            ileocecal valve, appendiceal orifice, and rectum                            were photographed. The bowel preparation used was                            SUPREP. Scope In: 9:15:15 AM Scope Out: 9:33:04 AM Scope Withdrawal Time: 0 hours 14 minutes 38 seconds  Total Procedure Duration: 0 hours 17 minutes 49 seconds  Findings:                 The digital rectal exam findings include decreased                            sphincter tone and internal hemorrhoids that                            prolapse with straining, but spontaneously regress                            to the resting position (Grade II).                           A 12 mm polyp was found in  the transverse colon.                            The polyp was semi-pedunculated with surface                            erosions. The polyp was removed with a hot snare.                            Resection and retrieval were complete.                           Two sessile polyps were found in the transverse                            colon. The polyps were 4 to 6 mm in size. These                            polyps were removed with a cold snare. Resection                            and retrieval were complete.                           Multiple diverticula were found in the left colon.                           The exam was otherwise without abnormality on                            direct and retroflexion views. Complications:            No immediate complications. Estimated Blood Loss:     Estimated blood loss was minimal. Impression:               - Decreased sphincter  tone and internal hemorrhoids                            that prolapse with straining, but spontaneously                            regress to the resting position (Grade II) found on                            digital rectal exam.                           - One 12 mm polyp in the transverse colon, removed                            with a hot snare. Resected and retrieved.                           - Two 4 to 6 mm polyps in the transverse colon,  removed with a cold snare. Resected and retrieved.                           - Diverticulosis in the left colon.                           - The examination was otherwise normal on direct                            and retroflexion views. Recommendation:           - Patient has a contact number available for                            emergencies. The signs and symptoms of potential                            delayed complications were discussed with the                            patient. Return to normal activities tomorrow.                            Written discharge instructions were provided to the                            patient.                           - Resume previous diet.                           - Resume Xarelto (rivaroxaban) at prior dose in 2                            days.                           - Await pathology results.                           - Repeat colonoscopy is recommended for                            surveillance. The colonoscopy date will be                            determined after pathology results from today's                            exam become available for review.                           - See the other procedure note for documentation of  additional recommendations. Shalamar Plourde L. Tara Carrow, MD 05/04/2018 9:49:20 AM This report has been signed electronically.

## 2018-05-04 NOTE — Progress Notes (Signed)
PT taken to PACU. Monitors in place. VSS. Report given to RN. 

## 2018-05-04 NOTE — Op Note (Signed)
[facility] Patient Name: Tara Serrano Procedure Date: 05/04/2018 9:08 AM MRN: [id number] Endoscopist: Mallie Mussel L. Loletha Carrow , MD Age: 73 Referring MD:  Date of Birth: 1945/08/12 Gender: Female Account #: [id number] Procedure:                Upper GI endoscopy Indications:              Suspected upper gastrointestinal bleeding in                            patient with unexplained iron deficiency anemia,                            Heme positive stool Medicines:                Monitored Anesthesia Care Procedure:                Pre-Anesthesia Assessment:                           - Prior to the procedure, a History and Physical                            was performed, and patient medications and                            allergies were reviewed. The patient's tolerance of                            previous anesthesia was also reviewed. The risks                            and benefits of the procedure and the sedation                            options and risks were discussed with the patient.                            All questions were answered, and informed consent                            was obtained. Prior Anticoagulants: The patient has                            taken Xarelto (rivaroxaban), last dose was 2 days                            prior to procedure. ASA Grade Assessment: III - A                            patient with severe systemic disease. After                            reviewing the risks and benefits, the patient was  deemed in satisfactory condition to undergo the                            procedure.                           After obtaining informed consent, the endoscope was                            passed under direct vision. Throughout the                            procedure, the patient's blood pressure, pulse, and                            oxygen saturations were monitored continuously. The        Endoscope was introduced through the mouth, and                            advanced to the second part of duodenum. The upper                            GI endoscopy was accomplished without difficulty.                            The patient tolerated the procedure well. Scope In: Scope Out: Findings:                 The esophagus was normal.                           Multiple large and small semi-pedunculated fundic                            gland polyps with no bleeding and no stigmata of                            recent bleeding (but with clean-based surface                            ulcerations) were found in the gastric fundus and                            in the gastric body.                           The cardia and gastric fundus were normal on                            retroflexion.                           The examined duodenum was normal. Complications:            No immediate complications. Estimated Blood Loss:     Estimated blood loss: none. Impression:               -  Normal esophagus.                           - Multiple fundic gland polyps.                           - Normal examined duodenum.                           - No specimens collected.                           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                           Discontinuing PPI therapy may cause gastric polyps                            to decrease in size, thereby reducing ulceration                            and chance of GI bleeding. Recommendation:           - Patient has a contact number available for                            emergencies. The signs and symptoms of potential                            delayed complications were discussed with the                            patient. Return to normal activities tomorrow.                             Written discharge instructions were provided to the                            patient.                           - Resume previous diet.                           - Resume Xarelto (rivaroxaban) at prior dose in 2                            days (see colonoscopy report).                           -  Return to primary care physician to monitor and                            treat anemia.                           - Discontinue aciphex (rabeprazole)                           Begin famotidine 40 mg twice daily, weaning down                            dose and frequency as tolerated by GERD symptoms.                            Disp# 60, RF 3                           Records will be sent to patient's                            hematologist/oncologist to consider IV iron                            treatment and for long term management of                            hemoglobin and iron therapy. Tava Peery L. Loletha Carrow, MD 05/04/2018 9:59:00 AM This report has been signed electronically.

## 2018-05-05 ENCOUNTER — Telehealth: Payer: Self-pay

## 2018-05-05 NOTE — Telephone Encounter (Signed)
  Follow up Call-  Call back number 05/04/2018  Post procedure Call Back phone  # [phone]  Permission to leave phone message Yes  Some recent data might be hidden     Patient questions:  Do you have a fever, pain , or abdominal swelling? No. Pain Score  0 *  Have you tolerated food without any problems? Yes.    Have you been able to return to your normal activities? Yes.    Do you have any questions about your discharge instructions: Diet   No. Medications  No. Follow up visit  No.  Do you have questions or concerns about your Care? No.  Actions: * If pain score is 4 or above: No action needed, pain <4.

## 2018-05-09 ENCOUNTER — Encounter: Payer: Self-pay | Admitting: Gastroenterology

## 2018-05-11 ENCOUNTER — Ambulatory Visit (HOSPITAL_COMMUNITY): Payer: Medicare Other | Attending: Cardiology

## 2018-05-11 ENCOUNTER — Telehealth: Payer: Self-pay

## 2018-05-11 DIAGNOSIS — I4819 Other persistent atrial fibrillation: Secondary | ICD-10-CM | POA: Diagnosis not present

## 2018-05-11 NOTE — Telephone Encounter (Signed)
Notes recorded by Frederik Schmidt, RN on 05/11/2018 at 2:36 PM EST [facility]pm with Echo results and was told to call us, if questions. 3/4 ------

## 2018-05-11 NOTE — Telephone Encounter (Signed)
-----   Message from Patsey Berthold, NP sent at 05/11/2018  2:33 PM EST ----- Please notify patient of stable echo. Thanks!

## 2018-06-13 ENCOUNTER — Ambulatory Visit (HOSPITAL_COMMUNITY)
Admission: RE | Admit: 2018-06-13 | Discharge: 2018-06-13 | Disposition: A | Discharge status: Home / Self Care | Payer: Medicare Other | Source: Ambulatory Visit | Attending: Hematology | Admitting: Hematology

## 2018-06-13 ENCOUNTER — Encounter (HOSPITAL_COMMUNITY): Payer: Self-pay

## 2018-06-13 ENCOUNTER — Other Ambulatory Visit: Payer: Self-pay

## 2018-06-13 DIAGNOSIS — R911 Solitary pulmonary nodule: Secondary | ICD-10-CM | POA: Diagnosis present

## 2018-06-13 DIAGNOSIS — M954 Acquired deformity of chest and rib: Secondary | ICD-10-CM | POA: Insufficient documentation

## 2018-06-14 NOTE — Progress Notes (Signed)
HEMATOLOGY/ONCOLOGY CONSULTATION NOTE  Date of Service: 06/11/17  Patient Care Team: Ivan Anchors, MD as PCP - General (Family Medicine)  CHIEF COMPLAINTS/PURPOSE OF CONSULTATION:  Rib mass ? Enchondroma  HISTORY OF PRESENTING ILLNESS:   Tara Serrano is a wonderful 73 y.o. female who has been referred to [place] by Dr Claiborne Billings, Grace Bushy, MD for evaluation and management of left rib lesions seen on recent CT A/P.   Initially, the patient presented into the ED on 11/06/16 complaining of a one week history of upper abdominal pain with associated nausea, poor appetite. During her visit to the ED she was found to have gallstones, distended gallbladder and dilated CBD on CT A/P. While in the ED she also became increasingly hypoxic and ABG was found to have significant CO2 retention. She was admitted to ICU at that time for workup with sepsis due to cholangitis.   During her admission she underwent a cholecystectomy and Zosyn therapy. Her cultures were without growth. She was discharged with Augmentin. Incidentally, on her CT she was found to have 1mm right lower lobe pulmonary nodule, 24mm cystic mass on tail of the pancreas, retroperitoneal lymphadenopathy, 3.7cm left fifth rib lesion, and sub-centimeter T1 hyperintense renal cortical lesions. She was discharged on 11/12/16 from her admission. Surgical pathology from her cholecystectomy showed no evidence of malignancy within the gallbladder.   On 12/10/16 she underwent PET/CT with results as demonstrated: Right lower lobe pulmonary nodule is not hypermetabolic. Recommend followup noncontrast chest CT in 6 months. Left fifth anterior rib lesion appears to be a chondroid lesion with low metabolic activity. 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 Biopsy versus close surveillance. Cystic lesion involving the right thyroid lobe is not hypermetabolic. Ultrasound follow-up suggested. She was subsequently referred into medical oncology for further  management of her disease.   Prior to her admission, she reports that she noticed approximately one year ago she was sick with a UTI with similar symptoms to her admission. She was placed on antibiotics which resolved her symptoms mostly, however, following this her symptoms had returned shortly after and she has not felt normal since. She also reports that she had lost approximately 25lbs throughout this time as well.   Since her surgery, she has been healing well. She reports that she has been experiencing some diarrhea, however, this has been manageable. Otherwise she has been feeling well. She has been keeping her fluid intake up throughout this and she has not felt dehydrated. She did try taking Imodium for this which helped, however, she stopped this because of side effects. She denies any abdominal cramping during her diarrhea, but she has noticed some drops of blood that she believes to be related to hemorrhoids. She has not recently had a mammogram. Her last colonoscopy was 2-3 years ago. She is now feeling mostly at her baseline otherwise and has otherwise been asymptomatic. She denies any pain specifically within her chest wall or palpable masses along the chest wall.   On review of systems, pt denies fever, chills, rash, mouth sores, weight loss, decreased appetite, urinary complaints. Denies pain. Pt denies abdominal pain, nausea, vomiting. She does reports ongoing diarrhea since her cholecystectomy. She also has chronic leg swelling which has been much improved lately since her admission.    INTERVAL HISTORY:   Tara Serrano is here for a follow up of her left rib lesion. The patient's last visit with [place] was on 12/10/17. The pt reports that she is doing well overall.   The pt  reports that she has not developed any new concerns in the interim. She notes that she "feels no better, and no worse," and remains at her baseline. The pt ntoes that she continues on 10mg  Prednisone for her PMR and  notes some mild, stable SOB. She denies abdominal pains, chest pains, or changes in her bowel habits.  Of note since the patient's last visit, pt has had a CT Chest completed on 06/13/18 with results revealing No appreciable change in the right lower lobe pulmonary nodules since most recent comparison CT and also when comparing back to PET-CT of 12/10/2016. These nodules have been stable for 18 months, most suggestive of benign etiology. Repeat CT in 6-12 months will provide 2 years of imaging follow-up and if stable at that time, these nodules can be considered benign based on consensus criteria. 2. No change in the anterior left fifth rib expansile lesion. As noted previously, differential considerations include enchondroma versus low-grade chondrosarcoma. 3. Cystic pancreatic lesion noted on prior CT not included in the field of view for today's exam. 4.  Aortic Atherosclerois.  On review of systems, pt reports stable energy levels, some SOB, stable weight, stable ankle swelling, and denies abdominal pains, CP, changes in bowel habits, unexpected weight loss, calf pain, and any other symptoms.   MEDICAL HISTORY:  Past Medical History:  Diagnosis Date   Acute cholecystitis 11/06/2016   Allergy    Anemia    Atrial fibrillation (HCC)    Cataract    Cholangitis    Diabetes mellitus without complication (HCC)    Elevated LFTs    GERD (gastroesophageal reflux disease)    Hyperlipidemia    Hypertension    Kidney disease    Neuromuscular disorder (HCC)    polymyalgia rheumatica   Pancreatic cyst    PMR (polymyalgia rheumatica) (HCC)    Pulmonary nodule    Rib lesion    Thyroid nodule     SURGICAL HISTORY: Past Surgical History:  Procedure Laterality Date   CHOLECYSTECTOMY N/A 11/10/2016   Procedure: LAPAROSCOPIC CHOLECYSTECTOMY WITH INTRAOPERATIVE CHOLANGIOGRAM;  Surgeon: Stark Klein, MD;  Location: [facility] OR;  Service: General;  Laterality: N/A;   COLONOSCOPY       SOCIAL HISTORY: Social History   Socioeconomic History   Marital status: Married    Spouse name: Not on file   Number of children: 1   Years of education: Not on file   Highest education level: Not on file  Occupational History   Occupation: Paediatric nurse strain: Not on file   Food insecurity:    Worry: Not on file    Inability: Not on file   Transportation needs:    Medical: Not on file    Non-medical: Not on file  Tobacco Use   Smoking status: Never Smoker   Smokeless tobacco: Never Used  Substance and Sexual Activity   Alcohol use: No   Drug use: No   Sexual activity: Not on file  Lifestyle   Physical activity:    Days per week: Not on file    Minutes per session: Not on file   Stress: Not on file  Relationships   Social connections:    Talks on phone: Not on file    Gets together: Not on file    Attends religious service: Not on file    Active member of club or organization: Not on file    Attends meetings of clubs or organizations: Not on file  Relationship status: Not on file   Intimate partner violence:    Fear of current or ex partner: Not on file    Emotionally abused: Not on file    Physically abused: Not on file    Forced sexual activity: Not on file  Other Topics Concern   Not on file  Social History Narrative   Lives with spouse in [place].    FAMILY HISTORY: Family History  Problem Relation Age of Onset   Stroke Mother    Diabetes Mother    Hypertension Mother    Chronic Renal Failure Father    Other Sister        Glioblastoma   Esophageal cancer Brother    Melanoma Brother    Colon cancer Neg Hx    Colon polyps Neg Hx    Rectal cancer Neg Hx    Stomach cancer Neg Hx     ALLERGIES:  is allergic to metformin and related and statins.  MEDICATIONS:  Current Outpatient Medications  Medication Sig Dispense Refill   carvedilol (COREG) 25 MG tablet TAKE 1 TABLET BY MOUTH  TWICE DAILY WITH A MEAL 180 tablet 3   famotidine (PEPCID) 20 MG tablet Take 2 tablets (40 mg total) by mouth 2 (two) times daily. 60 tablet 3   ferrous sulfate 325 (65 FE) MG tablet Take 325 mg by mouth daily with breakfast.     furosemide (LASIX) 20 MG tablet Take 1 tablet (20 mg total) by mouth daily. 30 tablet 5   glimepiride (AMARYL) 4 MG tablet Take 6 mg by mouth daily before breakfast.      lisinopril (PRINIVIL,ZESTRIL) 5 MG tablet Take 5 mg by mouth daily.     NIFEdipine (ADALAT CC) 90 MG 24 hr tablet Take 90 mg by mouth daily.     PREDNISONE PO Take 10 mg by mouth.      RABEprazole (ACIPHEX) 20 MG tablet Take 20 mg by mouth 2 (two) times daily.      rivaroxaban (XARELTO) 20 MG TABS tablet Take 20 mg by mouth daily.      No current facility-administered medications for this visit.     REVIEW OF SYSTEMS:    A 10+ POINT REVIEW OF SYSTEMS WAS OBTAINED including neurology, dermatology, psychiatry, cardiac, respiratory, lymph, extremities, GI, GU, Musculoskeletal, constitutional, breasts, reproductive, HEENT.  All pertinent positives are noted in the HPI.  All others are negative.   PHYSICAL EXAMINATION: ECOG PERFORMANCE STATUS: 1 - Symptomatic but completely ambulatory  Vitals:   06/15/18 0958  BP: (!) 145/90  Pulse: 87  Resp: 18  Temp: 98.1 F (36.7 C)  SpO2: 100%   Filed Weights   06/15/18 0958  Weight: 195 lb 9.6 oz (88.7 kg)   .Body mass index is 38.2 kg/m.  GENERAL:alert, in no acute distress and comfortable SKIN: no acute rashes, no significant lesions EYES: conjunctiva are pink and non-injected, sclera anicteric OROPHARYNX: MMM, no exudates, no oropharyngeal erythema or ulceration NECK: supple, no JVD. Palpable cyst is felt within the right neck CHEST: palpable mass on the left mid-chest anteriorly LYMPH:  no palpable lymphadenopathy in the cervical, axillary or inguinal regions LUNGS: clear to auscultation b/l with normal respiratory effort HEART:  regular rate & rhythm ABDOMEN:  normoactive bowel sounds , non tender, not distended. No palpable hepatosplenomegaly.  Extremity: 1+ pedal edema bilaterally PSYCH: alert & oriented x 3 with fluent speech NEURO: no focal motor/sensory deficits   LABORATORY DATA:  I have reviewed the data as listed  .  CBC Latest Ref Rng & Units 12/10/2017 06/11/2017 02/03/2017  WBC 3.9 - 10.3 K/uL 9.5 8.2 11.2(H)  Hemoglobin 11.6 - 15.9 g/dL 10.4(L) 11.2(L) 12.0  Hematocrit 34.8 - 46.6 % 34.5(L) 36.0 36.9  Platelets 145 - 400 K/uL 278 308 400  HGB 11.2  . CMP Latest Ref Rng & Units 12/10/2017 06/11/2017 02/03/2017  Glucose 70 - 99 mg/dL 212(H) 116 109(H)  BUN 8 - 23 mg/dL 26(H) 18 26(H)  Creatinine 0.44 - 1.00 mg/dL 1.07(H) 0.92 1.08(H)  Sodium 135 - 145 mmol/L 142 141 139  Potassium 3.5 - 5.1 mmol/L 3.7 3.6 3.6  Chloride 98 - 111 mmol/L 104 105 102  CO2 22 - 32 mmol/L 28 27 27   Calcium 8.9 - 10.3 mg/dL 9.0 9.3 9.3  Total Protein 6.5 - 8.1 g/dL 6.3(L) 7.0 -  Total Bilirubin 0.3 - 1.2 mg/dL 0.3 0.3 -  Alkaline Phos 38 - 126 U/L 104 117 -  AST 15 - 41 U/L 15 13 -  ALT 0 - 44 U/L 20 10 -   . Lab Results  Component Value Date   LDH 242 01/12/2017         RADIOGRAPHIC STUDIES: I have personally reviewed the radiological images as listed and agreed with the findings in the report. Ct Chest Wo Contrast  Result Date: 06/13/2018 CLINICAL DATA:  Pulmonary nodule.  Rib deformity. EXAM: CT CHEST WITHOUT CONTRAST TECHNIQUE: Multidetector CT imaging of the chest was performed following the standard protocol without IV contrast. COMPARISON:  11/26/2017.  06/04/2017. FINDINGS: Cardiovascular: The heart size is normal. No substantial pericardial effusion. Coronary artery calcification is evident. No thoracic aortic aneurysm. Mediastinum/Nodes: No mediastinal lymphadenopathy. No evidence for gross hilar lymphadenopathy although assessment is limited by the lack of intravenous contrast on today's study. The  esophagus has normal imaging features. There is no axillary lymphadenopathy. Stable 3.2 cm low-density right thyroid lesion. Lungs/Pleura: The central tracheobronchial airways are patent. Right lower lobe pulmonary nodule is stable at 12 x 7 mm today compared to 11 x 8 mm previously. Second 4 mm right lower lobe nodule (82/7) is also unchanged. Subtle mosaic attenuation is nonspecific but may reflect a degree of underlying air trapping. Tiny cluster of nodules posterior lingula (78/7) is stable. No pleural effusion. Upper Abdomen: Stable 13 mm left adrenal nodule with average attenuation value most suggestive of benign adrenal adenoma. Pancreatic lesion described on the prior study has not been included in the field of view on today's exam. Musculoskeletal: Mixed attenuation lesion in the anterior left fifth rib is stable, measuring 2.5 x 3.7 cm today compared to 2.6 x 3.7 cm previously. IMPRESSION: 1. No appreciable change in the right lower lobe pulmonary nodules since most recent comparison CT and also when comparing back to PET-CT of 12/10/2016. These nodules have been stable for 18 months, most suggestive of benign etiology. Repeat CT in 6-12 months will provide 2 years of imaging follow-up and if stable at that time, these nodules can be considered benign based on consensus criteria. 2. No change in the anterior left fifth rib expansile lesion. As noted previously, differential considerations include enchondroma versus low-grade chondrosarcoma. 3. Cystic pancreatic lesion noted on prior CT not included in the field of view for today's exam. 4.  Aortic Atherosclerois (ICD10-170.0) Electronically Signed   By: Misty Stanley M.D.   On: 06/13/2018 08:33     PET/CT 12/10/2016: IMPRESSION: 1. Right lower lobe pulmonary nodule is not hypermetabolic. Recommend followup noncontrast chest CT in 6 months. 2.  Left fifth anterior rib lesion appears to be a chondroid lesion with low metabolic activity. Enchondroma  versus low-grade chondrosarcoma. Biopsy versus close surveillance. 3. Cystic lesion involving the right thyroid lobe is not hypermetabolic. Ultrasound follow-up suggested. 4. Hypermetabolism in the liver around the gallbladder fossa likely from recent surgery. 5. Diffuse GI uptake, likely physiologic.   Electronically Signed   By: Marijo Sanes M.D.   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  Multiple small less than 1 cm cysts centered in the renal medulla bilaterally. Nonspecific could be related to congenital cystic kidney disease, medullary cystic kidney disease or medullary sponge kidney. No associated calcifications.  1.6 cm left adrenal adenoma.  Sigmoid diverticulosis, without diverticulitis.  Cholelithiasis.     US thyroid 01/23/2017: IMPRESSION: 1. No significant change in a predominantly cystic nodule in the right thyroid lobe measuring 3.9 cm.   ASSESSMENT & PLAN:   This is a wonderful 73 y.o. female who presents today to discuss:   1) Indeterminate left rib lesion 12/10/16 PET/CT- Radiology felt that this was possibly a chondroid lesion, enchondroma versus low-grade chondrosarcoma could not be ruled out. No other bony sites of pathology were identified.  06/04/17 CT reveals that the left rib expansile mass has not changed in size and that her pulmonary nodule in the right lower lobe is also stable.  -Rib mass appears to be a benign lesion such as a an enchondroma   11/26/17 CT Chest revealed Similar right lower lobe pulmonary nodules, including a dominant 11 mm nodule. Stability for approximately 1 year has been confirmed. Per consensus criteria, a total of 2 years of stability is required. Recommend CT follow-up at 6-12 months. 2. Similar appearance of anterior left fifth rib lesion with  differential considerations of enchondroma versus low-grade Chondrosarcoma. 3. Cystic lesion within the pancreatic body is incompletely imaged. This may be new or enlarged since the prior MRI. Recommend follow-up with abdominal pre and postcontrast MRCP/MRI at approximately 1 year. Please see 11/07/2016 MRI report for further discussion. 4. Pulmonary artery enlargement suggests pulmonary arterial hypertension.   Myeloma panel and SFLC done and show no overt evidence of monoclonal paraproteinemia to suggest Myeloma.  2) Mesenteric and retroperitoneal lymph nodes and diffuse uptake within the GI tract -- appear reactive ? Inflammation.  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  PLAN -Discussed the 06/13/18 CT Chest which revealed No appreciable change in the right lower lobe pulmonary nodules since most recent comparison CT and also when comparing back to PET-CT of 12/10/2016. These nodules have been stable for 18 months, most suggestive of benign etiology. Repeat CT in 6-12 months will provide 2 years of imaging follow-up and if stable at that time, these nodules can be considered benign based on consensus criteria. 2. No change in the anterior left fifth rib expansile lesion. As noted previously, differential considerations include enchondroma versus low-grade chondrosarcoma. 3. Cystic pancreatic lesion noted on prior CT not included in the field of view for today's exam. 4.  Aortic Atherosclerois. -Discussed that her rib lesion appears to be a cartilaginous benign tumor of some kind, not felt to be  malignant.  -Also suspect lung nodules are benign but intend to follow with an interval study in 1 year -Recommended again that the pt follow up with GI regarding her pancreatic cyst, Dr Loletha Carrow notes  there Is a plan to reimage these in Aug 2020 -Discussed continuing to watch these concerns vs additional biopsies, and the pt prefers the former which is reasonable at this time given radiographic, clinical, and lab stability -Recommended sports compression socks for her leg swelling that would likely be more comfortable than her previous compression socks   -Will see the pt back in 1 year with CT Chest   RTC with Dr Irene Limbo with labs and CT chest in 12 months    All of the patients questions were answered with apparent satisfaction. The patient knows to call the clinic with any problems, questions or concerns.  The total time spent in the appt was 20 minutes and more than 50% was on counseling and direct patient cares.    Sullivan Lone MD MS AAHIVMS [facility] Hematology/Oncology Physician [facility]  (Office):       [phone] (Work cell):  [phone] (Fax):           [phone]  06/15/2018 10:29 AM  I, Baldwin Jamaica, am acting as a scribe for Dr. Sullivan Lone.   .I have reviewed the above documentation for accuracy and completeness, and I agree with the above. Brunetta Genera MD

## 2018-06-15 ENCOUNTER — Inpatient Hospital Stay: Payer: Medicare Other | Attending: Hematology | Admitting: Hematology

## 2018-06-15 ENCOUNTER — Other Ambulatory Visit: Payer: Self-pay

## 2018-06-15 VITALS — BP 145/90 | HR 87 | Temp 98.1°F | Resp 18 | Ht 60.0 in | Wt 195.6 lb

## 2018-06-15 DIAGNOSIS — M859 Disorder of bone density and structure, unspecified: Secondary | ICD-10-CM | POA: Diagnosis present

## 2018-06-15 DIAGNOSIS — E079 Disorder of thyroid, unspecified: Secondary | ICD-10-CM | POA: Diagnosis not present

## 2018-06-15 DIAGNOSIS — K862 Cyst of pancreas: Secondary | ICD-10-CM | POA: Diagnosis not present

## 2018-06-15 DIAGNOSIS — M954 Acquired deformity of chest and rib: Secondary | ICD-10-CM

## 2018-06-15 DIAGNOSIS — N289 Disorder of kidney and ureter, unspecified: Secondary | ICD-10-CM | POA: Diagnosis not present

## 2018-06-15 DIAGNOSIS — R918 Other nonspecific abnormal finding of lung field: Secondary | ICD-10-CM | POA: Diagnosis present

## 2018-06-16 ENCOUNTER — Telehealth: Payer: Self-pay | Admitting: Hematology

## 2018-06-16 NOTE — Telephone Encounter (Signed)
Called and scheduled appt per 4/8 los.  Patient did not answer the phone, left a VM of appt and also stated they will need a CT.  Central radiology will contact patient about scan appt.

## 2018-10-13 ENCOUNTER — Telehealth: Payer: Self-pay

## 2018-10-13 ENCOUNTER — Other Ambulatory Visit: Payer: Self-pay

## 2018-10-13 DIAGNOSIS — K862 Cyst of pancreas: Secondary | ICD-10-CM

## 2018-10-13 NOTE — Telephone Encounter (Signed)
Pt agrees to have the repeat MRI. I scheduled at [facility] for 11-10-2018 @ 10am. Instruction have been mailed to patients home address.

## 2018-10-13 NOTE — Telephone Encounter (Signed)
Elias Else, CMA  Elias Else, CMA        Pt needs repeat imaging.      Please place a clinical reminder for CT pancreas Aug 2020 to follow up a pancreatic cyst.    Please advise on the order for this repeat CT. Thank you

## 2018-10-13 NOTE — Telephone Encounter (Signed)
Please contact patient and remind her of the previously-seen pancreatic cyst on 2018 scans.  If she is agreeable, please arrange and MRI abdomen with and without contrast (no MRCP needed) Indication is : follow up pancreatic cyst

## 2018-10-31 ENCOUNTER — Ambulatory Visit: Payer: Medicare Other | Admitting: Internal Medicine

## 2018-10-31 ENCOUNTER — Telehealth: Payer: Self-pay

## 2018-10-31 NOTE — Telephone Encounter (Signed)
Spoke with pt regarding appt on 11/02/18. Pt stated she will be at work at the time of he appt and would like to reschedule for a different day. Pt was informed her appt will be cancelled and someone will call her to reschedule.

## 2018-11-02 ENCOUNTER — Telehealth: Payer: Medicare Other | Admitting: Internal Medicine

## 2018-11-04 ENCOUNTER — Ambulatory Visit: Payer: Medicare Other | Admitting: Internal Medicine

## 2018-11-10 ENCOUNTER — Ambulatory Visit (HOSPITAL_COMMUNITY): Payer: Medicare Other

## 2018-11-22 ENCOUNTER — Ambulatory Visit (HOSPITAL_COMMUNITY)
Admission: RE | Admit: 2018-11-22 | Discharge: 2018-11-22 | Disposition: A | Discharge status: Home / Self Care | Payer: Medicare Other | Source: Ambulatory Visit | Attending: Gastroenterology | Admitting: Gastroenterology

## 2018-11-22 ENCOUNTER — Other Ambulatory Visit: Payer: Self-pay

## 2018-11-22 ENCOUNTER — Other Ambulatory Visit: Payer: Self-pay | Admitting: Gastroenterology

## 2018-11-22 DIAGNOSIS — K862 Cyst of pancreas: Secondary | ICD-10-CM

## 2018-11-22 LAB — POCT I-STAT CREATININE: Creatinine, Ser: 1.9 mg/dL — ABNORMAL HIGH (ref 0.44–1.00)

## 2018-11-22 MED ORDER — GADOBUTROL 1 MMOL/ML IV SOLN: 10.0000 mL | Freq: Once | INTRAVENOUS | Status: DC | PRN

## 2019-01-04 ENCOUNTER — Ambulatory Visit (INDEPENDENT_AMBULATORY_CARE_PROVIDER_SITE_OTHER): Payer: Medicare Other | Admitting: Internal Medicine

## 2019-01-04 ENCOUNTER — Encounter: Payer: Self-pay | Admitting: Internal Medicine

## 2019-01-04 ENCOUNTER — Other Ambulatory Visit: Payer: Self-pay

## 2019-01-04 VITALS — BP 146/88 | HR 98 | Ht 60.0 in | Wt 206.0 lb

## 2019-01-04 DIAGNOSIS — E042 Nontoxic multinodular goiter: Secondary | ICD-10-CM | POA: Diagnosis not present

## 2019-01-04 NOTE — Progress Notes (Signed)
Patient ID: Tara Serrano, female   DOB: 01-16-1946, 73 y.o.   MRN: [id number]    HPI  Tara Serrano is a 73 y.o.-year-old female, initially referred by her PCP, Tara Anchors, MD , returning for follow-up for thyroid nodules, dx'ed ~2007.  Last visit 1 year and 2 months.  Thyroid U/S (08/05/2017):  Right Predominantly cystic nodule with possible peripheral solid components measuring 3.8 x 2.7 x 3.7 cm, previously 3.9 x 2.6 x 3 cm.  She also has Left scattered tiny cystic nodules measuring less than 7 mm are unchanged.  Small 0.8 x 0.6 x 0.6 cm likely complex cystic nodule within the isthmus which previously measured 0.7 x 0.5 x 0.6 cm.  Reviewed the ultrasound images  - CD from [facility]:   R thyroid mostly cystic nodule:   L thyroid lobe:   She previously had FNAs of the largest nodules: 02/2005: FNA: benign 11/2007: FNA: benign  12/10/2016: PET scan: 2.9 x 2.8 cm complex thyroid cyst - not hypermetabolic.  Pt denies: - feeling nodules in neck - hoarseness - dysphagia - choking - SOB with lying down  TFTs: 10/27/2017: TFTs reportedly normal No results found for: TSH, FREET4   No FH of thyroid ds. No FH of thyroid cancer. No h/o radiation tx to head or neck.  No seaweed or kelp. No recent contrast studies. No herbal supplements. No Biotin use. + steroids use: Prednisone 10 mg daily for PMR >> 7.5 mg since last week, ut will need to increase it back.  Pt also has a history of type 2 diabetes- HbA1c on 07/27/2017 of 6.6%, HbA1c on 10/27/2017: 5.3% (decreased appetite) No results found for: HGBA1C  She is still on Xarelto.  ROS: Constitutional: + weight gain/no weight loss, no fatigue, no subjective hyperthermia, no subjective hypothermia Eyes: no blurry vision, no xerophthalmia ENT: no sore throat, + see HPI Cardiovascular: no CP/+ SOB/no palpitations/+ leg swelling Respiratory: + cough/+ SOB/no wheezing Gastrointestinal: no N/no V/no D/no C/no acid reflux  Musculoskeletal: + muscle aches/+ joint aches Skin: no rashes, no hair loss Neurological: no tremors/no numbness/no tingling/no dizziness  I reviewed pt's medications, allergies, PMH, social hx, family hx, and changes were documented in the history of present illness. Otherwise, unchanged from my initial visit note.  Past Medical History:  Diagnosis Date  . Acute cholecystitis 11/06/2016  . Allergy   . Anemia   . Atrial fibrillation ([place])   . Cataract   . Cholangitis   . Diabetes mellitus without complication ([place])   . Elevated LFTs   . GERD (gastroesophageal reflux disease)   . Hyperlipidemia   . Hypertension   . Kidney disease   . Neuromuscular disorder (HCC)    polymyalgia rheumatica  . Pancreatic cyst   . PMR (polymyalgia rheumatica) (HCC)   . Pulmonary nodule   . Rib lesion   . Thyroid nodule   Also, anemia.  Past Surgical History:  Procedure Laterality Date  . CHOLECYSTECTOMY N/A 11/10/2016   Procedure: LAPAROSCOPIC CHOLECYSTECTOMY WITH INTRAOPERATIVE CHOLANGIOGRAM;  Surgeon: Stark Klein, MD;  Location: [facility];  Service: General;  Laterality: N/A;  . COLONOSCOPY     Social History   Socioeconomic History  . Marital status: Married    Spouse name: Not on file  . Number of children: 1  . Years of education: Not on file  . Highest education level: Not on file  Occupational History  .  N/A  Tobacco Use  . Smoking status: Never Smoker  . Smokeless tobacco:  Never Used  Substance and Sexual Activity  . Alcohol use: No  . Drug use: No  . Sexual activity: Not on file   Lives with spouse in [place].   Current Outpatient Medications on File Prior to Visit  Medication Sig Dispense Refill  . carvedilol (COREG) 25 MG tablet TAKE 1 TABLET BY MOUTH TWICE DAILY WITH A MEAL 180 tablet 3  . famotidine (PEPCID) 20 MG tablet Take 2 tablets (40 mg total) by mouth 2 (two) times daily. 60 tablet 3  . ferrous sulfate 325 (65 FE) MG tablet Take 325 mg by mouth daily with  breakfast.    . furosemide (LASIX) 20 MG tablet Take 1 tablet (20 mg total) by mouth daily. 30 tablet 5  . glimepiride (AMARYL) 4 MG tablet Take 6 mg by mouth daily before breakfast.     . lisinopril (PRINIVIL,ZESTRIL) 5 MG tablet Take 5 mg by mouth daily.    Marland Kitchen NIFEdipine (ADALAT CC) 90 MG 24 hr tablet Take 90 mg by mouth daily.    Marland Kitchen PREDNISONE PO Take 10 mg by mouth.     . RABEprazole (ACIPHEX) 20 MG tablet Take 20 mg by mouth 2 (two) times daily.     . rivaroxaban (XARELTO) 20 MG TABS tablet Take 20 mg by mouth daily.      No current facility-administered medications on file prior to visit.    Allergies  Allergen Reactions  . Metformin And Related Nausea Only  . Statins Other (See Comments)    Muscle problems   Family History  Problem Relation Age of Onset  . Stroke Mother   . Diabetes Mother   . Hypertension Mother   . Chronic Renal Failure Father   . Other Sister        Glioblastoma  . Esophageal cancer Brother   . Melanoma Brother   . Colon cancer Neg Hx   . Colon polyps Neg Hx   . Rectal cancer Neg Hx   . Stomach cancer Neg Hx    PE: BP (!) 146/88   Pulse 98   Ht 5' (1.524 m)   Wt 206 lb (93.4 kg)   SpO2 95%   BMI 40.23 kg/m  Wt Readings from Last 3 Encounters:  01/04/19 206 lb (93.4 kg)  06/15/18 195 lb 9.6 oz (88.7 kg)  05/04/18 197 lb (89.4 kg)   Constitutional: overweight, in NAD Eyes: PERRLA, EOMI, no exophthalmos ENT: moist mucous membranes, no thyromegaly, no cervical lymphadenopathy Cardiovascular: tachycardia, RR, No MRG, + pitting edema bilateral LE Respiratory: CTA B Gastrointestinal: abdomen soft, NT, ND, BS+ Musculoskeletal: no deformities, strength intact in all 4 Skin: moist, warm, no rashes Neurological: no tremor with outstretched hands, DTR normal in all 4  ASSESSMENT: 1. Thyroid nodules  PLAN: 1. Thyroid nodules -Patient has a long history of thyroid nodules, of which the dominant one was large and situated in the right thyroid.   This is a predominantly cystic nodule with only peripheral solid material she also has several small thyroid nodules in the isthmus and also in the left lobe.  Note, she had FNA performed twice on this nodule in the past and the results were benign.  She also had a CAT scan in 2018 and this nodule was not hypermetabolic, pointing towards benignity. The recommendation from radiology was to only continue surveillance of this nodule, however, due to the increasing size of the nodule, the patient was referred to endocrinology for a second opinion.  After I reviewed the images  of her thyroid ultrasound, we discussed that the most likely reason for the increase in size of the nodule was accumulation of colloid. We can drain the right cystic nodule if she develops neck compression symptoms, but states she did not have them, we discussed to just monitor it. -Before our last visit, she started to experience frequent throat clearing and cough but this resolved on prednisone.  Time, she was also on lisinopril and we discussed that she may also need to stop this to see if her cough improves. She still coughs today >> allergies. -At today's visit, patient has no neck compression symptoms -We will need to obtain TFTs today - will check a TSH -Otherwise, we can go ahead and repeat the ultrasound now 1.5 years after the previous, but if this shows unchanged thyroid nodules, I would probably suggest to repeat this in 2 to 3 years if she has no neck compression symptoms.  If she does develop them, one option would be to drain the right thyroid cyst and to instill EtOH -I will see her back in 2 years, or sooner, depending on her symptoms  - time spent with the patient: 15 min,  of which >50% was spent in obtaining information about her symptoms, reviewing her previous labs, evaluations, and treatments, counseling her about her condition (please see the discussed topics above), and developing a plan to further investigate and  treat it; she had a number of questions which I addressed.  Office Visit on 01/04/2019  Component Date Value Ref Range Status  . TSH 01/04/2019 2.28  0.35 - 4.50 uIU/mL Final   TSH is normal.  I will addend the results of the thyroid U/S when they become available.  [name], MD PhD [facility]

## 2019-01-04 NOTE — Patient Instructions (Signed)
Please stop at the lab.  We will order a new thyroid U/S now.  Please come back in 2 years.

## 2019-01-06 LAB — TSH: TSH: 2.28 u[IU]/mL (ref 0.35–4.50)

## 2019-02-21 ENCOUNTER — Ambulatory Visit
Admission: RE | Admit: 2019-02-21 | Discharge: 2019-02-21 | Disposition: A | Discharge status: Home / Self Care | Payer: Medicare Other | Source: Ambulatory Visit | Attending: Internal Medicine | Admitting: Internal Medicine

## 2019-02-21 DIAGNOSIS — E042 Nontoxic multinodular goiter: Secondary | ICD-10-CM

## 2019-02-22 ENCOUNTER — Telehealth: Payer: Self-pay

## 2019-02-22 NOTE — Telephone Encounter (Signed)
-----   Message from [name], MD sent at 02/21/2019  5:02 PM EST ----- Tara Serrano, can you please call pt: the new thyroid ultrasound shows that her right thyroid nodule did not change in size significantly.  We can continue to just follow this.

## 2019-02-22 NOTE — Telephone Encounter (Signed)
Notified patient of message from Dr. Gherghe, patient expressed understanding and agreement. No further questions.  

## 2019-02-27 ENCOUNTER — Other Ambulatory Visit: Payer: Self-pay | Admitting: Internal Medicine

## 2019-04-27 ENCOUNTER — Telehealth (INDEPENDENT_AMBULATORY_CARE_PROVIDER_SITE_OTHER): Payer: Medicare Other | Admitting: Internal Medicine

## 2019-04-27 ENCOUNTER — Other Ambulatory Visit: Payer: Self-pay

## 2019-04-27 VITALS — Wt 172.0 lb

## 2019-04-27 DIAGNOSIS — I5033 Acute on chronic diastolic (congestive) heart failure: Secondary | ICD-10-CM

## 2019-04-27 DIAGNOSIS — I4819 Other persistent atrial fibrillation: Secondary | ICD-10-CM

## 2019-04-27 DIAGNOSIS — I10 Essential (primary) hypertension: Secondary | ICD-10-CM

## 2019-04-27 NOTE — Progress Notes (Signed)
Electrophysiology TeleHealth Note  Due to national recommendations of social distancing due to [place] 19, an audio telehealth visit is felt to be most appropriate for this patient at this time.  Verbal consent was obtained by me for the telehealth visit today.  The patient does not have capability for a virtual visit.  A phone visit is therefore required today.   Date:  04/27/2019   ID:  Tara Serrano, DOB [birth year], MRN [id number]  Location: patient's home  Provider location:  [place]  Evaluation Performed: Follow-up visit  PCP:  Ivan Anchors, MD   Electrophysiologist:  Dr Rayann Heman  Chief Complaint:  palpitations  History of Present Illness:    Tara Serrano is a 74 y.o. female who presents via telehealth conferencing today.  Since last being seen in our clinic, the patient reports doing resaonably well.  She remains unaware of her afib. She did have acute respiratory failure with diastolic dysfunction for which she was hospitalized and required diuresis in January.  I have reviewed the note in epic.  EF was preserved.  She had markedly elevated BNP and required 1600 ml diuresis.   Today, she denies symptoms of palpitations, chest pain,  lower extremity edema, dizziness, presyncope, or syncope.  The patient is otherwise without complaint today.  The patient denies symptoms of fevers, chills, cough, or new SOB worrisome for COVID 19.  Past Medical History:  Diagnosis Date  . Acute cholecystitis 11/06/2016  . Allergy   . Anemia   . Atrial fibrillation ([place])   . Cataract   . Cholangitis   . Diabetes mellitus without complication ([place])   . Elevated LFTs   . GERD (gastroesophageal reflux disease)   . Hyperlipidemia   . Hypertension   . Kidney disease   . Neuromuscular disorder (HCC)    polymyalgia rheumatica  . Pancreatic cyst   . PMR (polymyalgia rheumatica) (HCC)   . Pulmonary nodule   . Rib lesion   . Thyroid nodule     Past Surgical History:  Procedure  Laterality Date  . CHOLECYSTECTOMY N/A 11/10/2016   Procedure: LAPAROSCOPIC CHOLECYSTECTOMY WITH INTRAOPERATIVE CHOLANGIOGRAM;  Surgeon: Stark Klein, MD;  Location: [facility];  Service: General;  Laterality: N/A;  . COLONOSCOPY      Current Outpatient Medications  Medication Sig Dispense Refill  . Cholecalciferol 25 MCG (1000 UT) tablet Take 1 tablet by mouth daily.    . famotidine (PEPCID) 20 MG tablet Take 2 tablets (40 mg total) by mouth 2 (two) times daily. 60 tablet 3  . ferrous sulfate 325 (65 FE) MG tablet Take 325 mg by mouth daily with breakfast.    . fluticasone (FLONASE) 50 MCG/ACT nasal spray Place 1 spray into both nostrils daily.    Marland Kitchen glimepiride (AMARYL) 4 MG tablet Take 8 mg by mouth daily before breakfast.     . hydrALAZINE (APRESOLINE) 25 MG tablet Take 25 mg by mouth 3 (three) times daily.    Marland Kitchen lisinopril (ZESTRIL) 20 MG tablet Take 1 tablet by mouth in the morning and at bedtime.    . magnesium oxide (MAG-OX) 400 (241.3 Mg) MG tablet Take 1 tablet by mouth daily.    . metoprolol succinate (TOPROL-XL) 50 MG 24 hr tablet Take 75 mg by mouth 2 (two) times daily.    Marland Kitchen NIFEdipine (ADALAT CC) 90 MG 24 hr tablet Take 90 mg by mouth daily.    Marland Kitchen PREDNISONE PO Take 10 mg by mouth daily.     Marland Kitchen  rivaroxaban (XARELTO) 20 MG TABS tablet Take 20 mg by mouth daily.     Marland Kitchen senna-docusate (SENOKOT-S) 8.6-50 MG tablet Take 1 tablet by mouth as needed for constipation.    Marland Kitchen spironolactone (ALDACTONE) 25 MG tablet Take 25 mg by mouth daily.    Marland Kitchen SSD 1 % cream Apply 1 application topically daily.     No current facility-administered medications for this visit.    Allergies:   Metformin and related and Statins   Social History:  The patient  reports that she has never smoked. She has never used smokeless tobacco. She reports that she does not drink alcohol or use drugs.   Family History:  The patient's family history includes Chronic Renal Failure in her father; Diabetes in her mother;  Esophageal cancer in her brother; Hypertension in her mother; Melanoma in her brother; Other in her sister; Stroke in her mother.   ROS:  Please see the history of present illness.   All other systems are personally reviewed and negative.    Exam:    Vital Signs:  Wt 172 lb (78 kg)   BMI 33.59 kg/m   Well sounding, alert and conversant   Labs/Other Tests and Data Reviewed:    Recent Labs: 11/22/2018: Creatinine, Ser 1.90 01/04/2019: TSH 2.28   Wt Readings from Last 3 Encounters:  04/27/19 172 lb (78 kg)  01/04/19 206 lb (93.4 kg)  06/15/18 195 lb 9.6 oz (88.7 kg)   echo 03/29/19 (in [facility], available in care everywhere) shows EF 55-60%, mild to moderate MR  ASSESSMENT & PLAN:    1.  Acute on chronic diastolic dysfunction Recently admitted with NYHA Class 3 CHF BNP was 1600.  EF 55% Creatine 1.6 She diuresed with IV lasix with clinical improvement I would advise consideration of Alleviate HF trial.  She is very interested.  I will have our research team reach out to her. Return to see cardiology APP for CHF management in the next two weeks.  Would benefit from general cardiology care going forward.  2. Longstanding persistent afib Rate controlled On xarelto for chads2vasc score of at least 5  3. HTN Stable No change required today  4. Obesity Body mass index is 33.59 kg/m. She has managed to lose 16 lbs since we saw her last  Follow-up:  Cardiology team in the next 2 weeks Research to reach out to her regarding Alleviate HF   Patient Risk:  after full review of this patients clinical status, I feel that they are at moderate risk at this time.  Today, I have spent 15 minutes with the patient with telehealth technology discussing arrhythmia management .    Army Fossa, MD  04/27/2019 9:07 AM     [facility] [address] [place] [address] [phone] (office) [phone] (fax)

## 2019-05-03 ENCOUNTER — Telehealth: Payer: Self-pay

## 2019-05-03 DIAGNOSIS — Z006 Encounter for examination for normal comparison and control in clinical research program: Secondary | ICD-10-CM

## 2019-05-03 NOTE — Research (Signed)
Telephone encounter  Spoke with patient regarding Alleviate Heart Failure study. I sent the ICF to the patient and she has reviewed it at this time and would like to proceed with the Alleviate HF study.

## 2019-05-03 NOTE — Telephone Encounter (Signed)
Error

## 2019-05-07 ENCOUNTER — Other Ambulatory Visit: Payer: Self-pay | Admitting: *Deleted

## 2019-05-08 ENCOUNTER — Other Ambulatory Visit: Payer: Self-pay

## 2019-05-08 ENCOUNTER — Encounter: Payer: Self-pay | Admitting: Internal Medicine

## 2019-05-08 ENCOUNTER — Ambulatory Visit (INDEPENDENT_AMBULATORY_CARE_PROVIDER_SITE_OTHER): Payer: Medicare Other | Admitting: Internal Medicine

## 2019-05-08 VITALS — BP 128/80 | HR 93 | Ht 60.0 in | Wt 172.0 lb

## 2019-05-08 DIAGNOSIS — Z006 Encounter for examination for normal comparison and control in clinical research program: Secondary | ICD-10-CM

## 2019-05-08 DIAGNOSIS — I428 Other cardiomyopathies: Secondary | ICD-10-CM | POA: Insufficient documentation

## 2019-05-08 DIAGNOSIS — I10 Essential (primary) hypertension: Secondary | ICD-10-CM

## 2019-05-08 DIAGNOSIS — I4819 Other persistent atrial fibrillation: Secondary | ICD-10-CM

## 2019-05-08 DIAGNOSIS — I5032 Chronic diastolic (congestive) heart failure: Secondary | ICD-10-CM

## 2019-05-08 HISTORY — PX: OTHER SURGICAL HISTORY: SHX169

## 2019-05-08 NOTE — Progress Notes (Signed)
PCP: Ivan Anchors, MD Primary EP: Dr Blinda Leatherwood is a 74 y.o. female who presents today for routine electrophysiology followup.  Since last being seen in our clinic, the patient reports doing very well.  Her CHF has improved. Today, she denies symptoms of palpitations, chest pain, shortness of breath,  lower extremity edema, dizziness, presyncope, or syncope.  The patient is otherwise without complaint today.   Past Medical History:  Diagnosis Date  . Acute cholecystitis 11/06/2016  . Allergy   . Anemia   . Atrial fibrillation ([place])   . Cataract   . Cholangitis   . Diabetes mellitus without complication ([place])   . Elevated LFTs   . GERD (gastroesophageal reflux disease)   . Hyperlipidemia   . Hypertension   . Kidney disease   . Neuromuscular disorder (HCC)    polymyalgia rheumatica  . Pancreatic cyst   . PMR (polymyalgia rheumatica) (HCC)   . Pulmonary nodule   . Rib lesion   . Thyroid nodule    Past Surgical History:  Procedure Laterality Date  . CHOLECYSTECTOMY N/A 11/10/2016   Procedure: LAPAROSCOPIC CHOLECYSTECTOMY WITH INTRAOPERATIVE CHOLANGIOGRAM;  Surgeon: Stark Klein, MD;  Location: [facility];  Service: General;  Laterality: N/A;  . COLONOSCOPY      ROS- all systems are reviewed and negatives except as per HPI above  Current Outpatient Medications  Medication Sig Dispense Refill  . apixaban (ELIQUIS) 5 MG TABS tablet Take 5 mg by mouth 2 (two) times daily.    . Cholecalciferol 25 MCG (1000 UT) tablet Take 1 tablet by mouth daily.    . DULoxetine (CYMBALTA) 20 MG capsule Take 20 mg by mouth daily.    . famotidine (PEPCID) 20 MG tablet Take 2 tablets (40 mg total) by mouth 2 (two) times daily. 60 tablet 3  . ferrous sulfate 325 (65 FE) MG tablet Take 325 mg by mouth daily with breakfast.    . fluticasone (FLONASE) 50 MCG/ACT nasal spray Place 1 spray into both nostrils daily.    . furosemide (LASIX) 40 MG tablet Take 20 mg by mouth.    . hydrALAZINE  (APRESOLINE) 25 MG tablet Take 25 mg by mouth 3 (three) times daily.    Marland Kitchen linagliptin (TRADJENTA) 5 MG TABS tablet Take 5 mg by mouth daily.    . magnesium oxide (MAG-OX) 400 (241.3 Mg) MG tablet Take 1 tablet by mouth daily.    . metoprolol succinate (TOPROL-XL) 50 MG 24 hr tablet Take 75 mg by mouth 2 (two) times daily.    Marland Kitchen PREDNISONE PO Take 10 mg by mouth daily.     Marland Kitchen senna-docusate (SENOKOT-S) 8.6-50 MG tablet Take 1 tablet by mouth as needed for constipation.     No current facility-administered medications for this visit.    Physical Exam: Vitals:   05/08/19 1205  BP: 128/80  Pulse: 93  SpO2: 96%  Weight: 172 lb (78 kg)  Height: 5' (1.524 m)    GEN- The patient is well appearing, alert and oriented x 3 today.   Head- normocephalic, atraumatic Eyes-  Sclera clear, conjunctiva pink Ears- hearing intact Oropharynx- clear Lungs-   normal work of breathing Heart- irregular rate and rhythm  GI- soft, NT, ND, + BS Extremities- no clubbing, cyanosis, or edema  Wt Readings from Last 3 Encounters:  05/08/19 171 lb 15.3 oz (78 kg)  05/08/19 172 lb (78 kg)  04/27/19 172 lb (78 kg)    EKG tracing ordered today is personally  reviewed and shows afib, RBBB  Assessment and Plan:  1. Chronic diastolic dysfunction Recently admitted with NYHA Class III CHF I would advise loop recorder placement for Alleviate HF study.  Risks and benefits to ILR were discussed at length with the patient today, including but not limited to risks of bleeding and infection.  Extensive device education was performed.  Remote monitoring was also discussed at length today.  The patient understands and wishes to proceed.  We will proceed at this time with ILR implantation.  2. Longstanding persistent afib Rate controlled We will follow ventricular rates with ILR Continue xarelto for chads2vasc score of 5  3. HTN Stable No change required today  Return in 2 weeks for CHF management  Thompson Grayer  MD, [facility] 05/08/2019 4:24 PM      DESCRIPTION OF PROCEDURE:  Informed written consent was obtained.  The patient required no sedation for the procedure today.  The patients left chest was prepped and draped. Mapping over the patient's chest was performed to identify the appropriate ILR site.  This area was found to be the left inframammary region.  The skin overlying this region was infiltrated with lidocaine for local analgesia.  A 0.5-cm incision was made at the implant site.  A subcutaneous ILR pocket was fashioned using a combination of sharp and blunt dissection.  A Medtronic Reveal Linq model LNQ 11 implantable loop recorder [id number]) was then placed into the pocket R waves were very prominent and measured > 0.2 mV. EBL<1 ml.  Steri- Strips and a sterile dressing were then applied.  There were no early apparent complications.     CONCLUSIONS:   1. Successful implantation of a Medtronic Reveal LINQ implantable loop recorder for Alleviate HF study  2. No early apparent complications.   Thompson Grayer MD, [facility] 05/08/2019 4:24 PM

## 2019-05-08 NOTE — Research (Addendum)
Alleviate Heart Failure Research Study  Alleviate Informed Consent   Subject Name: Tara Serrano  Subject met inclusion and exclusion criteria.  The informed consent form, study requirements and expectations were reviewed with the subject and questions and concerns were addressed prior to the signing of the consent form.  The subject verbalized understanding of the trial requirements.  The subject agreed to participate in the Alleviate trial and signed the informed consent at 1045 on 05/08/2019.  The informed consent was obtained prior to performance of any protocol-specific procedures for the subject.  A copy of the signed informed consent was given to the subject and a copy was placed in the subject's medical record.    Reviewed of history and physical by investigator.   INCLUSION '[x]'  '[]'  Patient has NYHA Class II or III heart failure per most recent assessment, irrespective of left ventricular ejection fraction (LVEF)  '[x]'  '[]'  Patient has documented recent history of symptomatic heart failure, defined as meeting any one of the following three criteria: 1. Hospital admission with primary diagnosis of HF within the last 12 months, OR 2. Intravenous HF therapy (e.g. IV diuretics/vasodilators) or ultrafiltration within the last 6 months, OR 3. Patient had the following BNP/NT-proBNP6 within the last 3 months: - If LVEF ? 50%, then BNP> 150 pg/ml or NT-proBNP > 450 pg/ml OR - If LVEF is <50%, then BNP> 300 pg/ml or NT-proBNP > 900 pg/ml  '[x]'  '[]'  Patient is willing and able to comply with the protocol, including LINQ ICM insertion, CareLink transmissions (including adequate connectivity), study visits and remote care directions  '[x]'  '[]'  Patient is 75 years of age or older  '[x]'  '[]'  Patient has a life expectancy of 12 months or more    EXCLUSION '[]'  '[x]'  Patient is currently implanted with a cardiovascular implantable electronic device (CIED)7 (e.g. ICM8, pacemaker, ICD, CRT-D or CRT-P device) or  hemodynamic monitor   '[]'  '[x]'  Patient is receiving temporary or permanent mechanical circulatory support   '[]'  '[x]'  Patient had MI or PCI/CABG within past 90 days   '[]'  '[x]'  Patient has had a heart transplant, or is currently on heart transplant list   '[]'  '[x]'  Patient has severe valve stenosis on echocardiogram   '[]'  '[x]'  Patient has primary pulmonary hypertension (pre-capillary, WHO group 1,3,4,5)   '[]'  '[x]'  Patient is on chronic intravenous inotropic drug therapy (e.g. dobutamine, milrinone)   '[]'  '[x]'  Patient has severe renal impairment (eGFR <30 mL/min)   '[]'  '[x]'  Patient has systolic blood pressure of < 90 mmHg at the time of enrollment   '[]'  '[x]'  Patient is on chronic renal dialysis   '[]'  '[x]'  Patient is unable to undergo one round of PRN medication intervention (i.e. 4 days of increased diuretics dose), based on the judgement of the investigator (e.g. known history of side effects or intolerance to PRN dosing of diuretics)   '[]'  '[x]'  Patient has liver disease, defined as AST/ALT >5x normal, or bilirubin >2x normal   '[]'  '[x]'  Patient has serum albumin < 3 g/dL   '[]'  '[x]'  Patient has hypertrophic obstructive cardiomyopathy, constrictive pericarditis or amyloidosis   '[]'  '[x]'   Patient has complex adult congenital heart disease   '[]'  '[x]'  Patient has active cancer involving chemotherapy and/or radiation therapy   '[]'  '[x]'  Patient weighs more than 500 pounds  '[]'  '[x]'  Patient is pregnant (all females of child-bearing potential must have a negative pregnancy test within 1 week of enrollment)   [id number]  MOBILITY:    I HAVE NO PROBLEMS WALKING '[x]'   I HAVE SLIGHT PROBLEMS WALKING '[]'   I HAVE MODERATE PROBLEMS WALKING '[]'   I HAVE SEVERE PROBLEMS WALKING '[]'   I AM UNABLE TO WALK  '[]'     SELF-CARE:   I HAVE NO PROBLEMS WASHING OR DRESSING MYSELF  '[x]'   I HAVE SLIGHT PROBLEMS WASHING OR DRESSING MYSELF  '[]'   I HAVE MODERATE PROBLEMS WASHING OR DRESSING MYSELF '[]'   I HAVE SEVERE PROBLEMS WASHING OR DRESSING MYSELF  '[]'   I HAVE  SEVERE PROBLEMS WASHING OR DRESSING MYSELF  '[]'   I AM UNABLE TO WASH OR DRESS MYSELF '[]'     USUAL ACTIVITIES: (E.G. WORK/STUDY/HOUSEWORK/FAMILY OR LEISURE ACTIVITIES.    I HAVE NO PROBLEMS DOING MY USUAL ACTIVITIES '[]'   I HAVE SLIGHT PROBLEMS DOING MY USUAL ACTIVITIES '[]'   I HAVE MODERATE PROBLEMS DOING MY USUAL ACTIVIITIES '[x]'   I HAVE SEVERE PROBLEMS DOING MY USUAL ACTIVITIES '[]'   I AM UNABLE TO DO MY USUAL ACTIVITIES '[]'     PAIN /DISCOMFORT   I HAVE NO PAIN OR DISCOMFORT '[x]'   I HAVE SLIGHT PAIN OR DISCOMFORT '[]'   I HAVE MODERATE PAIN OR DISCOMFORT '[]'   I HAVE SEVERE PAIN OR DISCOMFORT '[]'   I HAVE EXTREME PAIN OR DISCOMFORT '[]'     ANXIETY/DEPRESSION   I AM NOT ANXIOUS OR DEPRESSED '[]'   I AM SLIGHTLY ANXIOUS OR DEPRESSED '[x]'   I AM MODERATELY ANXIOUS OR DREPRESSED '[]'   I AM SEVERELY ANXIOUS OR DEPRESSED '[]'   I AM EXTREMELY ANXIOUS OR DEPRESSED '[]'     SCALE OF 0-100 HOW WOULD YOU RATE TODAY?  0 IS THE WORSE AND 100 IS THE BEST HEALTH YOU CAN IMAGINE: 70    Alleviate Research Study  Device implanted at this time.  Device: Reveal LINQ [id number] SN: [id number] S Exp: 03/22/2020

## 2019-05-08 NOTE — Patient Instructions (Addendum)
Medication Instructions:  Your physician recommends that you continue on your current medications as directed. Please refer to the Current Medication list given to you today.  Labwork: None ordered.  Testing/Procedures: None ordered.  Follow-Up: Your physician wants you to follow-up:  May 24, 2019 at 10:30 am with Tara Kilts, PA at the [facility] office.   Any Other Special Instructions Will Be Listed Below (If Applicable).  If you need a refill on your cardiac medications before your next appointment, please call your pharmacy.    Implantable Loop Recorder Placement, Care After This sheet gives you information about how to care for yourself after your procedure. Your health care provider may also give you more specific instructions. If you have problems or questions, contact your health care provider. What can I expect after the procedure? After the procedure, it is common to have:  Soreness or discomfort near the incision.  Some swelling or bruising near the incision. Follow these instructions at home: Incision care   Follow instructions from your health care provider about how to take care of your incision. Make sure you:  You can remove your outer dressing after 24 hours ? Leave adhesive strips in place. These skin closures may need to stay in place for 2 weeks or longer. If adhesive strip edges start to loosen and curl up, you may trim the loose edges. Do not remove adhesive strips completely unless your health care provider tells you to do that.  Check your incision area every day for signs of infection. Check for: ? Redness, swelling, or pain. ? Fluid or blood. ? Warmth. ? Pus or a bad smell.  Do not take baths, swim, or use a hot tub until your health care provider approves. Ask your health care provider if you can take showers. Activity   Return to your normal activities as told by your health care provider. Ask your health care provider what  activities are safe for you. General instructions  Follow instructions from your health care provider about how to manage your implantable loop recorder and transmit the information. Learn how to activate a recording if this is necessary for your type of device.  Do not go through a metal detection gate, and do not let someone hold a metal detector over your chest. Show your ID card.  Do not have an MRI unless you check with your health care provider first.  Take over-the-counter and prescription medicines only as told by your health care provider.  Keep all follow-up visits as told by your health care provider. This is important. Contact a health care provider if:  You have redness, swelling, or pain around your incision.  You have a fever.  You have pain that is not relieved by your pain medicine.  You have triggered your device because of fainting (syncope) or because of a heartbeat that feels like it is racing, slow, fluttering, or skipping (palpitations). Get help right away if you have:  Chest pain.  Difficulty breathing. Summary  After the procedure, it is common to have soreness or discomfort near the incision.  Change your dressing as told by your health care provider.  Follow instructions from your health care provider about how to manage your implantable loop recorder and transmit the information.  Keep all follow-up visits as told by your health care provider. This is important. This information is not intended to replace advice given to you by your health care provider. Make sure you discuss any questions you have  with your health care provider. Document Revised: 04/10/2017 Document Reviewed: 04/10/2017 Elsevier Patient Education  2020 [employer].

## 2019-05-10 ENCOUNTER — Encounter: Payer: Medicare Other | Admitting: Student

## 2019-05-17 DIAGNOSIS — Z006 Encounter for examination for normal comparison and control in clinical research program: Secondary | ICD-10-CM

## 2019-05-17 DIAGNOSIS — I5032 Chronic diastolic (congestive) heart failure: Secondary | ICD-10-CM

## 2019-05-17 MED ORDER — POTASSIUM CHLORIDE CRYS ER 20 MEQ PO TBCR: 20.0000 meq | EXTENDED_RELEASE_TABLET | Freq: Every day | ORAL | 3 refills | Status: DC | PRN

## 2019-05-17 MED ORDER — FUROSEMIDE 20 MG PO TABS: 20.0000 mg | ORAL_TABLET | Freq: Every day | ORAL | 3 refills | Status: DC | PRN

## 2019-05-17 NOTE — Addendum Note (Signed)
Addended by: Rubie Maid B on: 05/17/2019 11:33 AM   Modules accepted: Orders

## 2019-05-17 NOTE — Research (Signed)
Called patient to go over medication list due to recent note in Care everywhere regarding changes. Reviewed meds with patient and Furosemide has been discontinued as of 05/09/19 by her MD. Asked patient about edema and she states "it is fine", I asked about daily weights and she stated "I have not been weighing daily" I explained the importance of daily weights and instructed patient on doing so and keeping a daily diary. I also asked the patient to please notify me of any medication changes by any other physicians.

## 2019-05-24 ENCOUNTER — Encounter: Payer: Medicare Other | Admitting: Student

## 2019-06-04 ENCOUNTER — Encounter (HOSPITAL_COMMUNITY): Payer: Self-pay

## 2019-06-04 ENCOUNTER — Inpatient Hospital Stay (HOSPITAL_COMMUNITY)
Admission: EM | Admit: 2019-06-04 | Discharge: 2019-07-08 | DRG: 304 | Disposition: E | Payer: Medicare Other | Attending: Internal Medicine | Admitting: Internal Medicine

## 2019-06-04 DIAGNOSIS — D638 Anemia in other chronic diseases classified elsewhere: Secondary | ICD-10-CM | POA: Diagnosis present

## 2019-06-04 DIAGNOSIS — I482 Chronic atrial fibrillation, unspecified: Secondary | ICD-10-CM | POA: Diagnosis present

## 2019-06-04 DIAGNOSIS — Z66 Do not resuscitate: Secondary | ICD-10-CM | POA: Diagnosis not present

## 2019-06-04 DIAGNOSIS — D849 Immunodeficiency, unspecified: Secondary | ICD-10-CM | POA: Diagnosis present

## 2019-06-04 DIAGNOSIS — R4182 Altered mental status, unspecified: Secondary | ICD-10-CM | POA: Diagnosis not present

## 2019-06-04 DIAGNOSIS — G9341 Metabolic encephalopathy: Secondary | ICD-10-CM | POA: Diagnosis present

## 2019-06-04 DIAGNOSIS — E876 Hypokalemia: Secondary | ICD-10-CM | POA: Diagnosis present

## 2019-06-04 DIAGNOSIS — Z79899 Other long term (current) drug therapy: Secondary | ICD-10-CM

## 2019-06-04 DIAGNOSIS — I674 Hypertensive encephalopathy: Secondary | ICD-10-CM | POA: Diagnosis present

## 2019-06-04 DIAGNOSIS — I161 Hypertensive emergency: Principal | ICD-10-CM | POA: Diagnosis present

## 2019-06-04 DIAGNOSIS — Z7901 Long term (current) use of anticoagulants: Secondary | ICD-10-CM

## 2019-06-04 DIAGNOSIS — E669 Obesity, unspecified: Secondary | ICD-10-CM | POA: Diagnosis present

## 2019-06-04 DIAGNOSIS — Z7984 Long term (current) use of oral hypoglycemic drugs: Secondary | ICD-10-CM

## 2019-06-04 DIAGNOSIS — Z20822 Contact with and (suspected) exposure to covid-19: Secondary | ICD-10-CM | POA: Diagnosis present

## 2019-06-04 DIAGNOSIS — I629 Nontraumatic intracranial hemorrhage, unspecified: Secondary | ICD-10-CM

## 2019-06-04 DIAGNOSIS — I6340 Cerebral infarction due to embolism of unspecified cerebral artery: Secondary | ICD-10-CM | POA: Diagnosis present

## 2019-06-04 DIAGNOSIS — D72829 Elevated white blood cell count, unspecified: Secondary | ICD-10-CM | POA: Diagnosis present

## 2019-06-04 DIAGNOSIS — Z9119 Patient's noncompliance with other medical treatment and regimen: Secondary | ICD-10-CM

## 2019-06-04 DIAGNOSIS — E785 Hyperlipidemia, unspecified: Secondary | ICD-10-CM | POA: Diagnosis present

## 2019-06-04 DIAGNOSIS — M353 Polymyalgia rheumatica: Secondary | ICD-10-CM | POA: Diagnosis present

## 2019-06-04 DIAGNOSIS — E871 Hypo-osmolality and hyponatremia: Secondary | ICD-10-CM | POA: Diagnosis present

## 2019-06-04 DIAGNOSIS — R0682 Tachypnea, not elsewhere classified: Secondary | ICD-10-CM

## 2019-06-04 DIAGNOSIS — I615 Nontraumatic intracerebral hemorrhage, intraventricular: Secondary | ICD-10-CM | POA: Diagnosis not present

## 2019-06-04 DIAGNOSIS — Z6833 Body mass index (BMI) 33.0-33.9, adult: Secondary | ICD-10-CM

## 2019-06-04 DIAGNOSIS — Z7952 Long term (current) use of systemic steroids: Secondary | ICD-10-CM

## 2019-06-04 DIAGNOSIS — I6783 Posterior reversible encephalopathy syndrome: Secondary | ICD-10-CM | POA: Diagnosis present

## 2019-06-04 DIAGNOSIS — I5032 Chronic diastolic (congestive) heart failure: Secondary | ICD-10-CM | POA: Diagnosis present

## 2019-06-04 DIAGNOSIS — N179 Acute kidney failure, unspecified: Secondary | ICD-10-CM | POA: Diagnosis present

## 2019-06-04 DIAGNOSIS — E1122 Type 2 diabetes mellitus with diabetic chronic kidney disease: Secondary | ICD-10-CM | POA: Diagnosis present

## 2019-06-04 DIAGNOSIS — K219 Gastro-esophageal reflux disease without esophagitis: Secondary | ICD-10-CM | POA: Diagnosis present

## 2019-06-04 DIAGNOSIS — E1165 Type 2 diabetes mellitus with hyperglycemia: Secondary | ICD-10-CM | POA: Diagnosis present

## 2019-06-04 DIAGNOSIS — Z515 Encounter for palliative care: Secondary | ICD-10-CM | POA: Diagnosis not present

## 2019-06-04 DIAGNOSIS — N183 Chronic kidney disease, stage 3 unspecified: Secondary | ICD-10-CM | POA: Diagnosis present

## 2019-06-04 DIAGNOSIS — I428 Other cardiomyopathies: Secondary | ICD-10-CM | POA: Diagnosis present

## 2019-06-04 DIAGNOSIS — K117 Disturbances of salivary secretion: Secondary | ICD-10-CM

## 2019-06-04 DIAGNOSIS — I130 Hypertensive heart and chronic kidney disease with heart failure and stage 1 through stage 4 chronic kidney disease, or unspecified chronic kidney disease: Secondary | ICD-10-CM | POA: Diagnosis present

## 2019-06-04 NOTE — ED Triage Notes (Signed)
BIB EMS from home. Per family pt has reported generalized weakness/fatigue over past few days. Hypertensive en route BP 200/120's. A/OX4

## 2019-06-05 ENCOUNTER — Emergency Department (HOSPITAL_COMMUNITY): Payer: Medicare Other

## 2019-06-05 ENCOUNTER — Other Ambulatory Visit: Payer: Self-pay

## 2019-06-05 ENCOUNTER — Inpatient Hospital Stay (HOSPITAL_COMMUNITY): Payer: Medicare Other

## 2019-06-05 DIAGNOSIS — I674 Hypertensive encephalopathy: Secondary | ICD-10-CM | POA: Diagnosis present

## 2019-06-05 DIAGNOSIS — I6340 Cerebral infarction due to embolism of unspecified cerebral artery: Secondary | ICD-10-CM | POA: Diagnosis present

## 2019-06-05 DIAGNOSIS — I480 Paroxysmal atrial fibrillation: Secondary | ICD-10-CM | POA: Diagnosis not present

## 2019-06-05 DIAGNOSIS — I161 Hypertensive emergency: Secondary | ICD-10-CM | POA: Diagnosis present

## 2019-06-05 DIAGNOSIS — Z7901 Long term (current) use of anticoagulants: Secondary | ICD-10-CM | POA: Diagnosis not present

## 2019-06-05 DIAGNOSIS — Z7189 Other specified counseling: Secondary | ICD-10-CM | POA: Diagnosis not present

## 2019-06-05 DIAGNOSIS — I629 Nontraumatic intracranial hemorrhage, unspecified: Secondary | ICD-10-CM | POA: Diagnosis not present

## 2019-06-05 DIAGNOSIS — E119 Type 2 diabetes mellitus without complications: Secondary | ICD-10-CM | POA: Diagnosis not present

## 2019-06-05 DIAGNOSIS — R4182 Altered mental status, unspecified: Secondary | ICD-10-CM | POA: Diagnosis present

## 2019-06-05 DIAGNOSIS — Z79899 Other long term (current) drug therapy: Secondary | ICD-10-CM | POA: Diagnosis not present

## 2019-06-05 DIAGNOSIS — I6783 Posterior reversible encephalopathy syndrome: Secondary | ICD-10-CM | POA: Diagnosis present

## 2019-06-05 DIAGNOSIS — I6389 Other cerebral infarction: Secondary | ICD-10-CM | POA: Diagnosis not present

## 2019-06-05 DIAGNOSIS — I428 Other cardiomyopathies: Secondary | ICD-10-CM | POA: Diagnosis present

## 2019-06-05 DIAGNOSIS — I5032 Chronic diastolic (congestive) heart failure: Secondary | ICD-10-CM | POA: Diagnosis present

## 2019-06-05 DIAGNOSIS — E876 Hypokalemia: Secondary | ICD-10-CM | POA: Diagnosis present

## 2019-06-05 DIAGNOSIS — N179 Acute kidney failure, unspecified: Secondary | ICD-10-CM | POA: Diagnosis present

## 2019-06-05 DIAGNOSIS — I4819 Other persistent atrial fibrillation: Secondary | ICD-10-CM | POA: Diagnosis not present

## 2019-06-05 DIAGNOSIS — K219 Gastro-esophageal reflux disease without esophagitis: Secondary | ICD-10-CM | POA: Diagnosis present

## 2019-06-05 DIAGNOSIS — E871 Hypo-osmolality and hyponatremia: Secondary | ICD-10-CM | POA: Diagnosis present

## 2019-06-05 DIAGNOSIS — D849 Immunodeficiency, unspecified: Secondary | ICD-10-CM | POA: Diagnosis present

## 2019-06-05 DIAGNOSIS — Z20822 Contact with and (suspected) exposure to covid-19: Secondary | ICD-10-CM | POA: Diagnosis present

## 2019-06-05 DIAGNOSIS — D72829 Elevated white blood cell count, unspecified: Secondary | ICD-10-CM | POA: Diagnosis present

## 2019-06-05 DIAGNOSIS — R0682 Tachypnea, not elsewhere classified: Secondary | ICD-10-CM | POA: Diagnosis not present

## 2019-06-05 DIAGNOSIS — I482 Chronic atrial fibrillation, unspecified: Secondary | ICD-10-CM | POA: Diagnosis present

## 2019-06-05 DIAGNOSIS — I130 Hypertensive heart and chronic kidney disease with heart failure and stage 1 through stage 4 chronic kidney disease, or unspecified chronic kidney disease: Secondary | ICD-10-CM | POA: Diagnosis present

## 2019-06-05 DIAGNOSIS — M353 Polymyalgia rheumatica: Secondary | ICD-10-CM | POA: Diagnosis present

## 2019-06-05 DIAGNOSIS — Z515 Encounter for palliative care: Secondary | ICD-10-CM | POA: Diagnosis not present

## 2019-06-05 DIAGNOSIS — E785 Hyperlipidemia, unspecified: Secondary | ICD-10-CM | POA: Diagnosis present

## 2019-06-05 DIAGNOSIS — I4891 Unspecified atrial fibrillation: Secondary | ICD-10-CM | POA: Diagnosis not present

## 2019-06-05 DIAGNOSIS — Z66 Do not resuscitate: Secondary | ICD-10-CM | POA: Diagnosis not present

## 2019-06-05 DIAGNOSIS — G9341 Metabolic encephalopathy: Secondary | ICD-10-CM | POA: Diagnosis present

## 2019-06-05 DIAGNOSIS — N183 Chronic kidney disease, stage 3 unspecified: Secondary | ICD-10-CM | POA: Diagnosis present

## 2019-06-05 DIAGNOSIS — I615 Nontraumatic intracerebral hemorrhage, intraventricular: Secondary | ICD-10-CM | POA: Diagnosis not present

## 2019-06-05 LAB — ECHOCARDIOGRAM COMPLETE
Height: 60 in
Weight: 2603.19 oz

## 2019-06-05 LAB — RESPIRATORY PANEL BY RT PCR (FLU A&B, COVID)
Influenza A by PCR: NEGATIVE
Influenza B by PCR: NEGATIVE
SARS Coronavirus 2 by RT PCR: NEGATIVE

## 2019-06-05 LAB — DIFFERENTIAL
Abs Immature Granulocytes: 0.08 10*3/uL — ABNORMAL HIGH (ref 0.00–0.07)
Basophils Absolute: 0 10*3/uL (ref 0.0–0.1)
Basophils Relative: 0 %
Eosinophils Absolute: 0 10*3/uL (ref 0.0–0.5)
Eosinophils Relative: 0 %
Immature Granulocytes: 1 %
Lymphocytes Relative: 5 %
Lymphs Abs: 0.7 10*3/uL (ref 0.7–4.0)
Monocytes Absolute: 1 10*3/uL (ref 0.1–1.0)
Monocytes Relative: 8 %
Neutro Abs: 11 10*3/uL — ABNORMAL HIGH (ref 1.7–7.7)
Neutrophils Relative %: 86 %

## 2019-06-05 LAB — CBC
HCT: 37.5 % (ref 36.0–46.0)
HCT: 37.6 % (ref 36.0–46.0)
Hemoglobin: 11.6 g/dL — ABNORMAL LOW (ref 12.0–15.0)
Hemoglobin: 11.6 g/dL — ABNORMAL LOW (ref 12.0–15.0)
MCH: 26.7 pg (ref 26.0–34.0)
MCH: 27.1 pg (ref 26.0–34.0)
MCHC: 30.9 g/dL (ref 30.0–36.0)
MCHC: 30.9 g/dL (ref 30.0–36.0)
MCV: 86.4 fL (ref 80.0–100.0)
MCV: 87.6 fL (ref 80.0–100.0)
Platelets: 347 10*3/uL (ref 150–400)
Platelets: 377 10*3/uL (ref 150–400)
RBC: 4.28 MIL/uL (ref 3.87–5.11)
RBC: 4.35 MIL/uL (ref 3.87–5.11)
RDW: 16.4 % — ABNORMAL HIGH (ref 11.5–15.5)
RDW: 16.6 % — ABNORMAL HIGH (ref 11.5–15.5)
WBC: 12.8 10*3/uL — ABNORMAL HIGH (ref 4.0–10.5)
WBC: 16 10*3/uL — ABNORMAL HIGH (ref 4.0–10.5)
nRBC: 0 % (ref 0.0–0.2)
nRBC: 0 % (ref 0.0–0.2)

## 2019-06-05 LAB — BASIC METABOLIC PANEL
Anion gap: 16 — ABNORMAL HIGH (ref 5–15)
BUN: 20 mg/dL (ref 8–23)
CO2: 26 mmol/L (ref 22–32)
Calcium: 8.3 mg/dL — ABNORMAL LOW (ref 8.9–10.3)
Chloride: 95 mmol/L — ABNORMAL LOW (ref 98–111)
Creatinine, Ser: 1.2 mg/dL — ABNORMAL HIGH (ref 0.44–1.00)
GFR calc Af Amer: 52 mL/min — ABNORMAL LOW (ref >60)
GFR calc non Af Amer: 44 mL/min — ABNORMAL LOW (ref >60)
Glucose, Bld: 198 mg/dL — ABNORMAL HIGH (ref 70–99)
Potassium: 3.5 mmol/L (ref 3.5–5.1)
Sodium: 137 mmol/L (ref 135–145)

## 2019-06-05 LAB — POCT I-STAT 7, (LYTES, BLD GAS, ICA,H+H)
Acid-Base Excess: 6 mmol/L — ABNORMAL HIGH (ref 0.0–2.0)
Bicarbonate: 28.6 mmol/L — ABNORMAL HIGH (ref 20.0–28.0)
Calcium, Ion: 1.16 mmol/L (ref 1.15–1.40)
HCT: 33 % — ABNORMAL LOW (ref 36.0–46.0)
Hemoglobin: 11.2 g/dL — ABNORMAL LOW (ref 12.0–15.0)
O2 Saturation: 97 %
Patient temperature: 99.4
Potassium: 2.8 mmol/L — ABNORMAL LOW (ref 3.5–5.1)
Sodium: 133 mmol/L — ABNORMAL LOW (ref 135–145)
TCO2: 30 mmol/L (ref 22–32)
pCO2 arterial: 34.1 mmHg (ref 32.0–48.0)
pH, Arterial: 7.533 — ABNORMAL HIGH (ref 7.350–7.450)
pO2, Arterial: 83 mmHg (ref 83.0–108.0)

## 2019-06-05 LAB — COMPREHENSIVE METABOLIC PANEL
ALT: 12 U/L (ref 0–44)
AST: 21 U/L (ref 15–41)
Albumin: 2.3 g/dL — ABNORMAL LOW (ref 3.5–5.0)
Alkaline Phosphatase: 66 U/L (ref 38–126)
Anion gap: 16 — ABNORMAL HIGH (ref 5–15)
BUN: 19 mg/dL (ref 8–23)
CO2: 24 mmol/L (ref 22–32)
Calcium: 8.4 mg/dL — ABNORMAL LOW (ref 8.9–10.3)
Chloride: 94 mmol/L — ABNORMAL LOW (ref 98–111)
Creatinine, Ser: 1.3 mg/dL — ABNORMAL HIGH (ref 0.44–1.00)
GFR calc Af Amer: 47 mL/min — ABNORMAL LOW (ref >60)
GFR calc non Af Amer: 40 mL/min — ABNORMAL LOW (ref >60)
Glucose, Bld: 186 mg/dL — ABNORMAL HIGH (ref 70–99)
Potassium: 3.2 mmol/L — ABNORMAL LOW (ref 3.5–5.1)
Sodium: 134 mmol/L — ABNORMAL LOW (ref 135–145)
Total Bilirubin: 1.6 mg/dL — ABNORMAL HIGH (ref 0.3–1.2)
Total Protein: 5.2 g/dL — ABNORMAL LOW (ref 6.5–8.1)

## 2019-06-05 LAB — URINALYSIS, ROUTINE W REFLEX MICROSCOPIC
Bilirubin Urine: NEGATIVE
Glucose, UA: 50 mg/dL — AB
Hgb urine dipstick: NEGATIVE
Ketones, ur: 20 mg/dL — AB
Leukocytes,Ua: NEGATIVE
Nitrite: NEGATIVE
Protein, ur: >=300 mg/dL — AB
Specific Gravity, Urine: 1.018 (ref 1.005–1.030)
pH: 7 (ref 5.0–8.0)

## 2019-06-05 LAB — AMMONIA: Ammonia: 11 umol/L (ref 9–35)

## 2019-06-05 LAB — MAGNESIUM: Magnesium: 1.8 mg/dL (ref 1.7–2.4)

## 2019-06-05 LAB — RAPID URINE DRUG SCREEN, HOSP PERFORMED
Amphetamines: NOT DETECTED
Barbiturates: NOT DETECTED
Benzodiazepines: NOT DETECTED
Cocaine: NOT DETECTED
Opiates: NOT DETECTED
Tetrahydrocannabinol: NOT DETECTED

## 2019-06-05 LAB — MRSA PCR SCREENING: MRSA by PCR: NEGATIVE

## 2019-06-05 LAB — CK: Total CK: 94 U/L (ref 38–234)

## 2019-06-05 LAB — BRAIN NATRIURETIC PEPTIDE: B Natriuretic Peptide: 765.5 pg/mL — ABNORMAL HIGH (ref 0.0–100.0)

## 2019-06-05 LAB — ETHANOL: Alcohol, Ethyl (B): <10 mg/dL (ref <10)

## 2019-06-05 LAB — CBG MONITORING, ED: Glucose-Capillary: 158 mg/dL — ABNORMAL HIGH (ref 70–99)

## 2019-06-05 LAB — HEMOGLOBIN A1C
Hgb A1c MFr Bld: 7.7 % — ABNORMAL HIGH (ref 4.8–5.6)
Mean Plasma Glucose: 174.29 mg/dL

## 2019-06-05 LAB — TSH: TSH: 1.255 u[IU]/mL (ref 0.350–4.500)

## 2019-06-05 LAB — TROPONIN I (HIGH SENSITIVITY): Troponin I (High Sensitivity): 49 ng/L — ABNORMAL HIGH (ref <18)

## 2019-06-05 LAB — GLUCOSE, CAPILLARY: Glucose-Capillary: 233 mg/dL — ABNORMAL HIGH (ref 70–99)

## 2019-06-05 LAB — PHOSPHORUS: Phosphorus: 3.2 mg/dL (ref 2.5–4.6)

## 2019-06-05 MED ORDER — PREDNISONE 5 MG PO TABS
10.0000 mg | ORAL_TABLET | Freq: Every day | ORAL | Status: DC
  Administered 2019-06-05 – 2019-06-07 (×3): 10 mg via ORAL
  Filled 2019-06-05: qty 2
  Filled 2019-06-05 (×2): qty 1

## 2019-06-05 MED ORDER — FLUTICASONE PROPIONATE 50 MCG/ACT NA SUSP
1.0000 | Freq: Every day | NASAL | Status: DC
  Administered 2019-06-05 – 2019-06-07 (×3): 1 via NASAL
  Filled 2019-06-05: qty 16

## 2019-06-05 MED ORDER — HYDRALAZINE HCL 20 MG/ML IJ SOLN
10.0000 mg | Freq: Once | INTRAMUSCULAR | Status: AC
  Administered 2019-06-05: 01:00:00 10 mg via INTRAVENOUS
  Filled 2019-06-05: qty 1

## 2019-06-05 MED ORDER — APIXABAN 5 MG PO TABS
5.0000 mg | ORAL_TABLET | Freq: Two times a day (BID) | ORAL | Status: DC
  Administered 2019-06-05 – 2019-06-07 (×6): 5 mg via ORAL
  Filled 2019-06-05 (×7): qty 1

## 2019-06-05 MED ORDER — NICARDIPINE HCL IN NACL 20-0.86 MG/200ML-% IV SOLN
3.0000 mg/h | INTRAVENOUS | Status: DC
  Administered 2019-06-05: 5 mg/h via INTRAVENOUS
  Filled 2019-06-05: qty 200

## 2019-06-05 MED ORDER — METOPROLOL TARTRATE 12.5 MG HALF TABLET
12.5000 mg | ORAL_TABLET | Freq: Two times a day (BID) | ORAL | Status: DC
  Administered 2019-06-05 (×3): 12.5 mg via ORAL
  Filled 2019-06-05 (×3): qty 1

## 2019-06-05 MED ORDER — INSULIN ASPART 100 UNIT/ML ~~LOC~~ SOLN
0.0000 [IU] | Freq: Every day | SUBCUTANEOUS | Status: DC
  Administered 2019-06-05: 23:00:00 2 [IU] via SUBCUTANEOUS
  Administered 2019-06-06: 4 [IU] via SUBCUTANEOUS
  Administered 2019-06-07: 3 [IU] via SUBCUTANEOUS

## 2019-06-05 MED ORDER — CHLORHEXIDINE GLUCONATE CLOTH 2 % EX PADS
6.0000 | MEDICATED_PAD | Freq: Every day | CUTANEOUS | Status: DC
  Administered 2019-06-05 – 2019-06-06 (×2): 6 via TOPICAL

## 2019-06-05 MED ORDER — LABETALOL HCL 5 MG/ML IV SOLN
0.5000 mg/min | INTRAVENOUS | Status: DC
  Administered 2019-06-05: 0.5 mg/min via INTRAVENOUS
  Filled 2019-06-05: qty 100

## 2019-06-05 MED ORDER — POTASSIUM CHLORIDE 10 MEQ/100ML IV SOLN
10.0000 meq | INTRAVENOUS | Status: AC
  Administered 2019-06-05 (×4): 10 meq via INTRAVENOUS
  Filled 2019-06-05 (×3): qty 100

## 2019-06-05 MED ORDER — INSULIN ASPART 100 UNIT/ML ~~LOC~~ SOLN
0.0000 [IU] | Freq: Three times a day (TID) | SUBCUTANEOUS | Status: DC
  Administered 2019-06-06: 3 [IU] via SUBCUTANEOUS
  Administered 2019-06-06: 5 [IU] via SUBCUTANEOUS
  Administered 2019-06-06: 1 [IU] via SUBCUTANEOUS
  Administered 2019-06-07: 3 [IU] via SUBCUTANEOUS
  Administered 2019-06-07 (×2): 2 [IU] via SUBCUTANEOUS

## 2019-06-05 MED ORDER — ORAL CARE MOUTH RINSE
15.0000 mL | Freq: Two times a day (BID) | OROMUCOSAL | Status: DC
  Administered 2019-06-05 – 2019-06-07 (×6): 15 mL via OROMUCOSAL

## 2019-06-05 MED ORDER — FUROSEMIDE 10 MG/ML IJ SOLN
40.0000 mg | Freq: Once | INTRAMUSCULAR | Status: AC
  Administered 2019-06-05: 40 mg via INTRAVENOUS
  Filled 2019-06-05: qty 4

## 2019-06-05 MED ORDER — FAMOTIDINE 20 MG PO TABS
40.0000 mg | ORAL_TABLET | Freq: Two times a day (BID) | ORAL | Status: DC
  Administered 2019-06-05 (×2): 40 mg via ORAL
  Filled 2019-06-05 (×2): qty 2

## 2019-06-05 NOTE — ED Notes (Signed)
Patient son Tara Serrano calling to leave his info for we can call and give update to him over night  Please call [phone]

## 2019-06-05 NOTE — Progress Notes (Signed)
  Echocardiogram 2D Echocardiogram has been performed.  Michiel Cowboy 06/05/2019, 10:31 AM

## 2019-06-05 NOTE — Progress Notes (Signed)
eLink Physician-Brief Progress Note Patient Name: Kemonie Duca DOB: [birth year] MRN: [id number]   Date of Service  06/05/2019  HPI/Events of Note  74 yr old female with hx of A fib, on eliquis, Non ischemic cardiomyopathy with CC: AMS for days. Dx : Encephalaopthy from HTN emergency-? PRES. CTH : no acute changes. No Sepsis. CxR no CHF. ED doc swithcing from Cardene to Labetalol gtt ( A fib RVR/RBBB). LFT, amonia normal. neg for ETOH. Covid pending   Camera; Obese, on room air, sats 95%, SBP 146. On Labetalol 0.25 mg/min. Getting K replaced.  Resting.   Meds/Orders reviewed. EKG: afib RVR, RBBB. qtc 503.    eICU Interventions  - continue care - follow MRI for any PRESS syndrome. - asp and sz precautions - ECHO - wean down labetalol, SBP to be at 170 or above.  - continue eliquis - BG goals < 180. Received Daltasone 10 mg once, but Covid neg.  - on pepcid for stress ulcer prophylaxis. - follow AM labs for K level. Lytes.  - already started on oral metoprolol.  - follow OP. Cr level.      Intervention Category Major Interventions: Hypertension - evaluation and management;Arrhythmia - evaluation and management Evaluation Type: New Patient Evaluation  Elmer Sow 06/05/2019, 4:51 AM

## 2019-06-05 NOTE — ED Notes (Signed)
Lab called by RN to run urinalysis and hold UDS per MD Wards orders.

## 2019-06-05 NOTE — ED Provider Notes (Signed)
TIME SEEN: 12:16 AM  CHIEF COMPLAINT: Altered mental status  HPI: Patient is a 74 year old female with history of atrial fibrillation on Eliquis, hypertension, hyperlipidemia, chronic kidney disease who presents to the emergency department with altered mental status.  Most of the history is provided by her son and daughter over the phone.  Patient states she is not sure why she is here.  AMS x 2 days.  On couch x 24 hours.  Seems "lethargic" and just answers "I don't know."  Hasn't taken any medications today.  Lives with son, [name].  Eating less than normal for a few days.  Fall unwitnessed Friday.  On Eliquis.  Unclear if hit her head or not.  Adjusted her hydralazine but Rx not yet picked up.  She is also on Toprol and Eliquis.  PCP visit Wednesday 24th.  PCP is Dr. Claiborne Billings in [place].  No h/o ETOH or drug abuse.  Family denies any recent known fevers, cough, vomiting, diarrhea.  Family unsure what her blood pressure normally runs.  Patient tells me that she is not having any headache, chest pain, shortness of breath.  She denies any numbness.  She reports she is normally able to ambulate without assistance.  Daughter - [name] - [phone]  Son [name] - [phone]    ROS: Level 5 caveat secondary to altered mental status  PAST MEDICAL HISTORY/PAST SURGICAL HISTORY:  Past Medical History:  Diagnosis Date  . Acute cholecystitis 11/06/2016  . Allergy   . Anemia   . Atrial fibrillation ([place])   . Cataract   . Cholangitis   . Diabetes mellitus without complication ([place])   . Elevated LFTs   . GERD (gastroesophageal reflux disease)   . Hyperlipidemia   . Hypertension   . Kidney disease   . Neuromuscular disorder ([facility])    polymyalgia rheumatica  . Pancreatic cyst   . PMR (polymyalgia rheumatica) ([facility])   . Pulmonary nodule   . Rib lesion   . Thyroid nodule     MEDICATIONS:  Prior to Admission medications   Medication Sig Start Date End Date Taking?  Authorizing Provider  apixaban (ELIQUIS) 5 MG TABS tablet Take 5 mg by mouth 2 (two) times daily.    [provider]  Cholecalciferol 25 MCG (1000 UT) tablet Take 1 tablet by mouth daily. 03/01/19   [provider]  DULoxetine (CYMBALTA) 20 MG capsule Take 20 mg by mouth daily.    [provider]  famotidine (PEPCID) 20 MG tablet Take 2 tablets (40 mg total) by mouth 2 (two) times daily. 05/04/18   Nelida Meuse III, MD  ferrous sulfate 325 (65 FE) MG tablet Take 325 mg by mouth daily with breakfast.    [provider]  fluticasone (FLONASE) 50 MCG/ACT nasal spray Place 1 spray into both nostrils daily. 03/26/19   [provider]  furosemide (LASIX) 20 MG tablet Take 1 tablet (20 mg total) by mouth daily as needed. Patient will only take if instructed for Alleviate HF research protocol. 05/17/19 05/11/20  Allred, Jeneen Rinks, MD  hydrALAZINE (APRESOLINE) 25 MG tablet Take 25 mg by mouth 3 (three) times daily. 04/02/19   [provider]  linagliptin (TRADJENTA) 5 MG TABS tablet Take 5 mg by mouth daily.    [provider]  magnesium oxide (MAG-OX) 400 (241.3 Mg) MG tablet Take 1 tablet by mouth daily. 04/02/19   [provider]  metoprolol succinate (TOPROL-XL) 50 MG 24 hr tablet Take 75 mg by mouth  2 (two) times daily. 04/19/19   [provider]  potassium chloride SA (KLOR-CON) 20 MEQ tablet Take 1 tablet (20 mEq total) by mouth daily as needed. Patient will only take if instructed for Alleviate HF research protocol. 05/17/19   Allred, Jeneen Rinks, MD  PREDNISONE PO Take 10 mg by mouth daily.     [provider]  senna-docusate (SENOKOT-S) 8.6-50 MG tablet Take 1 tablet by mouth as needed for constipation. 04/02/19 04/01/20  [provider]    ALLERGIES:  Allergies  Allergen Reactions  . Metformin And Related Nausea Only  . Statins Other (See Comments)    Muscle problems    SOCIAL HISTORY:  Social History    Tobacco Use  . Smoking status: Never Smoker  . Smokeless tobacco: Never Used  Substance Use Topics  . Alcohol use: No    FAMILY HISTORY: Family History  Problem Relation Age of Onset  . Stroke Mother   . Diabetes Mother   . Hypertension Mother   . Chronic Renal Failure Father   . Other Sister        Glioblastoma  . Esophageal cancer Brother   . Melanoma Brother   . Colon cancer Neg Hx   . Colon polyps Neg Hx   . Rectal cancer Neg Hx   . Stomach cancer Neg Hx     EXAM: BP (!) 222/166   Pulse (!) 106   Temp 98 F (36.7 C)   Resp (!) 26   Ht 5' (1.524 m)   Wt 79.4 kg   SpO2 97%   BMI 34.18 kg/m  CONSTITUTIONAL: Alert and oriented to person, place and year but does not know why she is here.  Obese.  Arousable. HEAD: Normocephalic, appears atraumatic EYES: Conjunctivae clear, pupils appear equal, EOM appear intact ENT: normal nose; moist mucous membranes NECK: Supple, normal ROM CARD: Irregularly irregular and minimally tachycardic; S1 and S2 appreciated; no murmurs, no clicks, no rubs, no gallops RESP: Normal chest excursion without splinting or tachypnea; breath sounds clear and equal bilaterally; no wheezes, no rhonchi, no rales, no hypoxia or respiratory distress, speaking full sentences ABD/GI: Normal bowel sounds; non-distended; soft, non-tender, no rebound, no guarding, no peritoneal signs, no hepatosplenomegaly BACK:  The back appears normal EXT: Normal ROM in all joints; no deformity noted, bilateral symmetric nonpitting edema in her lower extremities; no cyanosis SKIN: Normal color for age and race; warm; no rash on exposed skin NEURO: Moves all extremities equally but she has difficulty lifting either leg off of the bed, no pronator drift of the upper extremities, normal speech, cranial nerves II through XII intact, reports normal sensation diffusely PSYCH: The patient's mood and manner are appropriate.   MEDICAL DECISION MAKING: Patient here with altered  mental status.  Found to be extremely hypertensive here.  I am concerned for hypertensive encephalopathy.  Will give IV hydralazine.  If no significant improvement will start Cardene infusion.  Will also obtain labs to evaluate for any other organic cause of her altered mental status.  Family denies any recent infectious symptoms.  Recently had her hydralazine increased at her PCPs visit.  I am unable to tell what her blood pressure was during that appointment.  Family is not aware what it normally runs either.  Daughter does report that she does not believe her mother is often compliant with her medications.  No history of drug or alcohol abuse.  No obvious focal neurologic deficits other than appearing drowsy and having a difficult time  lifting either leg off of the bed.  Patient will need admission.  ED PROGRESS: No significant provement with IV hydralazine.  Cardene infusion started and blood pressure improving but now heart rate has gone up into the 120s and she is in A. fib with RVR.  Will change to a labetalol infusion to see if this better controls her heart rate and her blood pressure.  Labs show leukocytosis of 16,000.  Rectal temp of 99.4.  Urine pending.  Chest x-ray shows cardiomegaly and atelectasis without pneumonia, edema.   CT head shows no acute abnormality.  Troponin mildly elevated in the setting of elevated creatinine and hypertensive emergency.  She denies any chest pain or shortness of breath.  2:19 AM  Spoke with E link for admission to ICU.  Urine shows no sign of infection.  Urine drug screen negative.  Updated daughter by phone.    3:00 AM  Pt's blood pressure significantly improved with labetalol.  No significant change in her mental status.  ICU team at bedside.   I reviewed all nursing notes and pertinent previous records as available.  I have interpreted any EKGs, lab and urine results, imaging (as available).    EKG Interpretation  Date/Time:  Monday June 05 2019 01:42:56 EDT Ventricular Rate:  122 PR Interval:    QRS Duration: 136 QT Interval:  353 QTC Calculation: 503 R Axis:   92 Text Interpretation: Atrial fibrillation Ventricular bigeminy Right bundle branch block ST depression, consider ischemia, diffuse lds No significant change since last tracing Confirmed by Zhaire Locker, Cyril Mourning [phone]) on 06/05/2019 1:47:07 AM        CRITICAL CARE Performed by: Cyril Mourning Shay Jhaveri   Total critical care time: 65 minutes  Critical care time was exclusive of separately billable procedures and treating other patients.  Critical care was necessary to treat or prevent imminent or life-threatening deterioration.  Critical care was time spent personally by me on the following activities: development of treatment plan with patient and/or surrogate as well as nursing, discussions with consultants, evaluation of patient's response to treatment, examination of patient, obtaining history from patient or surrogate, ordering and performing treatments and interventions, ordering and review of laboratory studies, ordering and review of radiographic studies, pulse oximetry and re-evaluation of patient's condition.   Tara Serrano was evaluated in Emergency Department on 06/05/2019 for the symptoms described in the history of present illness. She was evaluated in the context of the global COVID-19 pandemic, which necessitated consideration that the patient might be at risk for infection with the SARS-CoV-2 virus that causes COVID-19. Institutional protocols and algorithms that pertain to the evaluation of patients at risk for COVID-19 are in a state of rapid change based on information released by regulatory bodies including the CDC and federal and state organizations. These policies and algorithms were followed during the patient's care in the ED.  Patient was seen wearing N95, face shield, gloves.    Cheral Cappucci, Delice Bison, DO 06/05/19 [phone]

## 2019-06-05 NOTE — H&P (Signed)
NAME:  Tara Serrano, MRN:  [id number], DOB:  [birth year], LOS: 0 ADMISSION DATE:  06/05/2019, CONSULTATION DATE:  06/05/19 REFERRING MD:  Ward, CHIEF COMPLAINT:  Generalized weakness   Brief History   74 year old female with history of hypertension and A. fib was brought in for altered mental status for the last 2 days medications.  Found to have hypertensive emergency and started on labetalol gtt. and PCCM consulted for admission  History of present illness   74 year old female with a history of atrial fibrillation on Eliquis, HFpEF hypertension, DM, hyperlipidemia, polymyalgia rheumatica who was brought in by family members for increased lethargy and less interactive than usual.  She lives with family, and they report she has not been taking her home medications.  No noted fevers, chills, nausea, focal deficits.  Emergency department initial blood pressure was 239/108, patient has been somnolent throughout ED stay but arousable and answer simple questions.  CT head was negative, troponin very mildly elevated at 49, chest x-ray without widened mediastinum or pulmonary edema.  Leukocytosis of 16k without signs of infection chest x-ray. she was started on Cardene GTT with little improvement and changed to labetalol drip and PCCM consulted for admission  Past Medical History   has a past medical history of Acute cholecystitis (11/06/2016), Allergy, Anemia, Atrial fibrillation ([place]), Cataract, Cholangitis, Diabetes mellitus without complication ([place]), Elevated LFTs, GERD (gastroesophageal reflux disease), Hyperlipidemia, Hypertension, Kidney disease, Neuromuscular disorder ([place]), Pancreatic cyst, PMR (polymyalgia rheumatica) ([place]), Pulmonary nodule, Rib lesion, and Thyroid nodule.   Significant Hospital Events   3/29 admit to PCCM  Consults:    Procedures:    Significant Diagnostic Tests:  3/29 CT head>> chronic white matter ischemic changes without acute abnormality  Micro Data:  3/29  respiratory viral panel>> negative  Antimicrobials:    Interim history/subjective:  Blood pressure improved a little drip, patient remained somnolent though protecting her airway.  Objective   Blood pressure (!) 148/77, pulse 98, temperature 99.4 F (37.4 C), temperature source Rectal, resp. rate (!) 22, height 5' (1.524 m), weight 79.4 kg, SpO2 94 %.       No intake or output data in the 24 hours ending 06/05/19 0321 Filed Weights   06/01/2019 2357  Weight: 79.4 kg    General:   Elderly female in no acute distress HEENT: MM pink/moist Neuro: Opens eyes to voice, easily falls back to sleep, oriented to person and will answer questions with one-word answers, moving all extremities to command, pupils equal and reactive to light CV: s1s2, irregular rhythm, no m/r/g PULM: CTA B GI: soft, bsx4 active  Extremities: warm/dry, 2+ edema Skin: no rashes or lesions   Resolved Hospital Problem list     Assessment & Plan:   Hypertensive emergency  -Negative head CT P: -Continue labetalol gtt, titrate to goal SBP of 165-170 -Restart metoprolol at lower dose 12.5 twice daily, hold hydralazine for now -Obtain ABG to evaluate for hypercarbia contributing to altered mental status and obtain MRI brain   Acute Encephalopathy -baseline oriented and fairly independent per son -likely secondary to the above, ammonia normal, no clear source of infection P: -obtain ABG to evaluate for hypercarbia with low threshold for Bpap  -check MRI brain  CKD -Creatinine 1.3, baseline appears to be 0.9-1.9 P: -Monitor renal function and urine output along with electrolytes  Hypokalemia -K 3.2 P: -Replace with 41 M EQ K overnight, check magnesium level  Atrial fibrillation, HFpEF, hypertension, hyperlipidemia -Rate controlled -Last echo 1 year ago with EF of  [id number] and diastolic heart failure P: -Continue Eliquis and metoprolol -Check repeat echocardiogram -Monitor renal function resume  Lasix    Polymyalgia rheumatica -continue Prednisone 10mg   Type 2 DM -hold home Tradjenta and treat with SSI    Best practice:  Diet: npo Pain/Anxiety/Delirium protocol (if indicated): n/a VAP protocol (if indicated): n/a DVT prophylaxis: eliquis GI prophylaxis: n/a Glucose control: SSI Mobility: bed rest Code Status: Full code per son Family Communication: Son Jenny Reichmann updated by phone  Disposition: ICU   Labs   CBC: Recent Labs  Lab 06/05/19 0024  WBC 16.0*  HGB 11.6*  HCT 37.5  MCV 87.6  PLT Q000111Q    Basic Metabolic Panel: Recent Labs  Lab 06/05/19 0024  NA 134*  K 3.2*  CL 94*  CO2 24  GLUCOSE 186*  BUN 19  CREATININE 1.30*  CALCIUM 8.4*   GFR: Estimated Creatinine Clearance: 35.4 mL/min (A) (by C-G formula based on SCr of 1.3 mg/dL (H)). Recent Labs  Lab 06/05/19 0024  WBC 16.0*    Liver Function Tests: Recent Labs  Lab 06/05/19 0024  AST 21  ALT 12  ALKPHOS 66  BILITOT 1.6*  PROT 5.2*  ALBUMIN 2.3*   No results for input(s): LIPASE, AMYLASE in the last 168 hours. Recent Labs  Lab 06/05/19 0028  AMMONIA 11    ABG    Component Value Date/Time   PHART 7.465 (H) 11/06/2016 2206   PCO2ART 44.2 11/06/2016 2206   PO2ART 90.0 11/06/2016 2206   HCO3 31.8 (H) 11/06/2016 2206   TCO2 33 (H) 11/06/2016 2206   O2SAT 97.0 11/06/2016 2206     Coagulation Profile: No results for input(s): INR, PROTIME in the last 168 hours.  Cardiac Enzymes: Recent Labs  Lab 06/05/19 0024  CKTOTAL 94    HbA1C: No results found for: HGBA1C  CBG: Recent Labs  Lab 06/05/19 0041  GLUCAP 158*    Review of Systems:   Unable to obtain secondary to AMS  Past Medical History  She,  has a past medical history of Acute cholecystitis (11/06/2016), Allergy, Anemia, Atrial fibrillation ([place]), Cataract, Cholangitis, Diabetes mellitus without complication ([place]), Elevated LFTs, GERD (gastroesophageal reflux disease), Hyperlipidemia, Hypertension, Kidney  disease, Neuromuscular disorder ([place]), Pancreatic cyst, PMR (polymyalgia rheumatica) ([place]), Pulmonary nodule, Rib lesion, and Thyroid nodule.   Surgical History    Past Surgical History:  Procedure Laterality Date  . CHOLECYSTECTOMY N/A 11/10/2016   Procedure: LAPAROSCOPIC CHOLECYSTECTOMY WITH INTRAOPERATIVE CHOLANGIOGRAM;  Surgeon: Stark Klein, MD;  Location: [facility];  Service: General;  Laterality: N/A;  . COLONOSCOPY    . implantable loop recorder placement  05/08/2019   [employer] Reveal Linq model LNQ 11 implantable loop recorder [id number]) implanted by Dr Rayann Heman for Alleviate HF study protocol     Social History   reports that she has never smoked. She has never used smokeless tobacco. She reports that she does not drink alcohol or use drugs.   Family History   Her family history includes Chronic Renal Failure in her father; Diabetes in her mother; Esophageal cancer in her brother; Hypertension in her mother; Melanoma in her brother; Other in her sister; Stroke in her mother. There is no history of Colon cancer, Colon polyps, Rectal cancer, or Stomach cancer.   Allergies Allergies  Allergen Reactions  . Metformin And Related Nausea Only  . Statins Other (See Comments)    Muscle problems     Home Medications  Prior to Admission medications   Medication Sig Start Date End Date  Taking? Authorizing Provider  apixaban (ELIQUIS) 5 MG TABS tablet Take 5 mg by mouth 2 (two) times daily.    [provider]  Cholecalciferol 25 MCG (1000 UT) tablet Take 1 tablet by mouth daily. 03/01/19   [provider]  DULoxetine (CYMBALTA) 20 MG capsule Take 20 mg by mouth daily.    [provider]  famotidine (PEPCID) 20 MG tablet Take 2 tablets (40 mg total) by mouth 2 (two) times daily. 05/04/18   Nelida Meuse III, MD  ferrous sulfate 325 (65 FE) MG tablet Take 325 mg by mouth daily with breakfast.    [provider]  fluticasone (FLONASE) 50 MCG/ACT nasal  spray Place 1 spray into both nostrils daily. 03/26/19   [provider]  furosemide (LASIX) 20 MG tablet Take 1 tablet (20 mg total) by mouth daily as needed. Patient will only take if instructed for Alleviate HF research protocol. 05/17/19 05/11/20  Allred, Jeneen Rinks, MD  hydrALAZINE (APRESOLINE) 25 MG tablet Take 25 mg by mouth 3 (three) times daily. 04/02/19   [provider]  linagliptin (TRADJENTA) 5 MG TABS tablet Take 5 mg by mouth daily.    [provider]  magnesium oxide (MAG-OX) 400 (241.3 Mg) MG tablet Take 1 tablet by mouth daily. 04/02/19   [provider]  metoprolol succinate (TOPROL-XL) 50 MG 24 hr tablet Take 75 mg by mouth 2 (two) times daily. 04/19/19   [provider]  potassium chloride SA (KLOR-CON) 20 MEQ tablet Take 1 tablet (20 mEq total) by mouth daily as needed. Patient will only take if instructed for Alleviate HF research protocol. 05/17/19   Allred, Jeneen Rinks, MD  PREDNISONE PO Take 10 mg by mouth daily.     [provider]  senna-docusate (SENOKOT-S) 8.6-50 MG tablet Take 1 tablet by mouth as needed for constipation. 04/02/19 04/01/20  [provider]     Critical care time: 55 minutes    CRITICAL CARE Performed by: Otilio Carpen Yobana Culliton   Total critical care time: 55 minutes  Critical care time was exclusive of separately billable procedures and treating other patients.  Critical care was necessary to treat or prevent imminent or life-threatening deterioration.  Critical care was time spent personally by me on the following activities: development of treatment plan with patient and/or surrogate as well as nursing, discussions with consultants, evaluation of patient's response to treatment, examination of patient, obtaining history from patient or surrogate, ordering and performing treatments and interventions, ordering and review of laboratory studies, ordering and review of radiographic studies, pulse oximetry and  re-evaluation of patient's condition.   Otilio Carpen Taahir Grisby, PA-C

## 2019-06-05 NOTE — ED Notes (Signed)
Patient Tara Serrano calling [phone]  Asking for update on patient

## 2019-06-05 NOTE — Progress Notes (Signed)
PULMONARY / CRITICAL CARE MEDICINE   NAME:  Tara Serrano, MRN:  [id number], DOB:  [birth year], LOS: 0 ADMISSION DATE:  05/27/2019, CONSULTATION DATE:  06/02/2019 REFERRING MD: Cyril Mourning Ward, DO, CHIEF COMPLAINT: Altered mental status  BRIEF HISTORY:    74 y/o female with h/o A. 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  HISTORY OF PRESENT ILLNESS   Two day h/o AMS w/decreased appetite at home where patient seemed lethargic. The patient then suffered an unwitnessed fall on 3/26, unclear if head trauma occurred however CT head indicative of chronic changes. During transport patient was found to be hypertensive to 200s/120s. Had recent change in home medications for hypertension but prescription was not yet filled for new dose of hydralazine. Labetalol gtt initiated and AMS progressed d/t systolic bps to Q000111Q. Labetalol weaned and AMS improving with goal systolic bps of [id number]. Remainder of vital signs currently stable. Serum creatinine: 1.2. Troponin I 49 and BNP 765.5.  SIGNIFICANT PAST MEDICAL HISTORY   Atrial Fibrillation Hypertension Chronic Kidney Disease Diabetes Polymyelgia Rheumatica GERD  SIGNIFICANT EVENTS:  No significant events overnight. More alert and interactive this morning.  STUDIES:   ECHO 3/29:  IMPRESSIONS: 1. Left ventricular ejection fraction, by estimation, is 60 to 65%. The  left ventricle has normal function. The left ventricle has no regional  wall motion abnormalities. There is moderate left ventricular hypertrophy.  Left ventricular diastolic  parameters are indeterminate.  2. Right ventricular systolic function is mildly reduced. The right  ventricular size is normal. There is normal pulmonary artery systolic  pressure. The estimated right ventricular systolic pressure is 0000000 mmHg.  3. The mitral valve is normal in structure.  Mild mitral valve  regurgitation.  4. The aortic valve is tricuspid. Aortic valve regurgitation is not  visualized. Mild aortic valve sclerosis is present, with no evidence of  aortic valve stenosis.  5. The inferior vena cava is normal in size with greater than 50%  respiratory variability, suggesting right atrial pressure of 3 mmHg.   MRI Brain w/o contrast 3/29:  IMPRESSION: 1. Small acute to subacute infarcts in multiple distributions suggesting recurrent central embolic disease in this patient with atrial fibrillation. 2. Patchy signal abnormality in the cerebellum, brainstem, and occipital parietal convexities. In the appropriate setting, favor posterior reversible encephalopathy syndrome. Suggest follow up to document resolution. 3. Extensive chronic small vessel ischemia.  CT Head w/o Contrast 3/29:   IMPRESSION: Chronic white matter ischemic change without acute abnormality.  Portable Chest X-ray 3/29:  IMPRESSION: 1. Mild left basilar subsegmental atelectasis. No other active cardiopulmonary disease. 2. Cardiomegaly without pulmonary edema.  CULTURES:  MRSA, COVID, FLU neg.  ANTIBIOTICS:  None  LINES/TUBES:  PIV x2  CONSULTANTS:  Neurology  SUBJECTIVE:  No acute events, systolic blood pressure currently at goal. Able to take oral medications. Showing signs of improvement in mentation with family at bedside.   CONSTITUTIONAL: BP (!) 168/98   Pulse 92   Temp 98 F (36.7 C)   Resp 19   Ht 5' (1.524 m)   Wt 73.8 kg   SpO2 94%   BMI 31.78 kg/m   I/O last 3 completed shifts: In: 201.7 [I.V.:58.9; IV Piggyback:142.8] Out: -    PHYSICAL EXAM: General: Somnolent appearing, obese female. Lying in bed in no acute distress Neuro: Inattentive on exam with waxing/waning presentation. Cranial nerves grossly intact.  No focal limb deficits. HEENT: Normocephalic  and atraumatic. Moist mucus membranes. Facial presentation congruent with body habitus, no  signs of moon facies. Neck soft and supple.  Cardiovascular: RRR. Normal S1 with widely split S2. No murmurs, gallops or rubs. Peripheral DP/PT pulses palable. 2+ Peripheral edema.  Unable to assess JVP Lungs: Clear to auscultation B/L. Normal work of breathing. Abdomen: Soft. NT/ND.  Musculoskeletal: Adequate strength in upper extremities b/l.  Skin: Warm, pink with mild bruising noted on bilateral upper extremities.   RESOLVED PROBLEM LIST    ASSESSMENT AND PLAN   57 female with h/o A. 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   Hypertensive encephalopathy/emergency: improving evidence of PRES vs associated CVA  Goal systolic blood pressure Q000111Q Currently on Labetalol 0.25 mg/min Oral metoprolol 12.5 mg BID Neurology to see.  Atrial Fibrillation: Continue Eliquis 5 mg BID Currently in sinus rhythm, continue to monitor  Nonischemic cardiomyopathy: H/o HFpEF, will continue to monitor TTE ordered Diurese today  Diabetes:  BG goal <200 mg/dL, currently in 190s  Polymyalgia Rheumatica: Continue prednisone 10 mg daily  GERD:  Continue Famotidine   Goals of care: I had a long discussion with the patient's sister and daughter.  There has been more prolonged functional decline.  Patient is likely noncompliant and have suboptimal living conditions.  She has not been working for some time.  She appears to have become more withdrawn since the death of her husband.  We have thus discussed the possibility that some limitations in aggressivity of care may be appropriate as the patient already has a suboptimal quality of life.  SUMMARY OF TODAY'S PLAN:  Neurology consult Continue to wean Labetalol gtt Advance diet as tolerated     Best Practice / Goals of Care / Disposition.   DVT PROPHYLAXIS: Currently on Eliquis, SCDs on and activated SUP: famotidine NUTRITION: Continue to  Advance Diet as tolerated MOBILITY: OOB when tolerated  GOALS OF CARE: Wean Labetalol gtt and continue transition to oral medications.  FAMILY DISCUSSIONS: Family at bedside, updated today.  Palliative care consultation. LABS: Daily CBC, BMP DISPOSITION ICU  LABS  Glucose Recent Labs  Lab 06/05/19 0041  GLUCAP 158*    BMET Recent Labs  Lab 06/05/19 0024 06/05/19 0349 06/05/19 0501  NA 134* 133* 137  K 3.2* 2.8* 3.5  CL 94*  --  95*  CO2 24  --  26  BUN 19  --  20  CREATININE 1.30*  --  1.20*  GLUCOSE 186*  --  198*    Liver Enzymes Recent Labs  Lab 06/05/19 0024  AST 21  ALT 12  ALKPHOS 66  BILITOT 1.6*  ALBUMIN 2.3*    Electrolytes Recent Labs  Lab 06/05/19 0024 06/05/19 0501  CALCIUM 8.4* 8.3*  MG  --  1.8  PHOS  --  3.2    CBC Recent Labs  Lab 06/05/19 0024 06/05/19 0349 06/05/19 0501  WBC 16.0*  --  12.8*  HGB 11.6* 11.2* 11.6*  HCT 37.5 33.0* 37.6  PLT 377  --  347    ABG Recent Labs  Lab 06/05/19 0349  PHART 7.533*  PCO2ART 34.1  PO2ART 83.0    Coag's No results for input(s): APTT, INR in the last 168 hours.  Sepsis Markers No results for input(s): LATICACIDVEN, PROCALCITON, O2SATVEN in the last 168 hours.  Cardiac Enzymes No results for input(s): TROPONINI, PROBNP in the last 168 hours.  Kipp Brood, MD [facility] ICU Physician  [place]  Pager: [phone] Mobile: [phone] After hours: [phone].  06/05/2019, 6:40 PM

## 2019-06-05 NOTE — ED Notes (Signed)
Accidentally documented IV removal.

## 2019-06-05 NOTE — Progress Notes (Signed)
[facility] Progress Note Patient Name: Tara Serrano DOB: 04-03-1945 MRN: [id number]   Date of Service  06/05/2019  HPI/Events of Note  Hyperglycemia - Blood glucose = 158. Patient is on a PO diet.   eICU Interventions  Will order: 1. AC/HS sensitive Novolog SSI.      Intervention Category Major Interventions: Hyperglycemia - active titration of insulin therapy  Lysle Dingwall 06/05/2019, 10:04 PM

## 2019-06-06 DIAGNOSIS — I6389 Other cerebral infarction: Secondary | ICD-10-CM

## 2019-06-06 DIAGNOSIS — I6783 Posterior reversible encephalopathy syndrome: Secondary | ICD-10-CM

## 2019-06-06 LAB — GLUCOSE, CAPILLARY
Glucose-Capillary: 141 mg/dL — ABNORMAL HIGH (ref 70–99)
Glucose-Capillary: 239 mg/dL — ABNORMAL HIGH (ref 70–99)
Glucose-Capillary: 252 mg/dL — ABNORMAL HIGH (ref 70–99)
Glucose-Capillary: 312 mg/dL — ABNORMAL HIGH (ref 70–99)

## 2019-06-06 LAB — BASIC METABOLIC PANEL
Anion gap: 13 (ref 5–15)
BUN: 26 mg/dL — ABNORMAL HIGH (ref 8–23)
CO2: 27 mmol/L (ref 22–32)
Calcium: 8.2 mg/dL — ABNORMAL LOW (ref 8.9–10.3)
Chloride: 95 mmol/L — ABNORMAL LOW (ref 98–111)
Creatinine, Ser: 1.46 mg/dL — ABNORMAL HIGH (ref 0.44–1.00)
GFR calc Af Amer: 41 mL/min — ABNORMAL LOW (ref >60)
GFR calc non Af Amer: 35 mL/min — ABNORMAL LOW (ref >60)
Glucose, Bld: 257 mg/dL — ABNORMAL HIGH (ref 70–99)
Potassium: 3.8 mmol/L (ref 3.5–5.1)
Sodium: 135 mmol/L (ref 135–145)

## 2019-06-06 LAB — CBC
HCT: 35.3 % — ABNORMAL LOW (ref 36.0–46.0)
Hemoglobin: 10.6 g/dL — ABNORMAL LOW (ref 12.0–15.0)
MCH: 26.8 pg (ref 26.0–34.0)
MCHC: 30 g/dL (ref 30.0–36.0)
MCV: 89.1 fL (ref 80.0–100.0)
Platelets: 367 10*3/uL (ref 150–400)
RBC: 3.96 MIL/uL (ref 3.87–5.11)
RDW: 16.9 % — ABNORMAL HIGH (ref 11.5–15.5)
WBC: 10.1 10*3/uL (ref 4.0–10.5)
nRBC: 0 % (ref 0.0–0.2)

## 2019-06-06 MED ORDER — HYDRALAZINE HCL 25 MG PO TABS
12.5000 mg | ORAL_TABLET | Freq: Three times a day (TID) | ORAL | Status: DC
  Administered 2019-06-07 – 2019-06-08 (×2): 12.5 mg via ORAL
  Filled 2019-06-06 (×2): qty 1

## 2019-06-06 MED ORDER — METOPROLOL TARTRATE 25 MG PO TABS
25.0000 mg | ORAL_TABLET | Freq: Two times a day (BID) | ORAL | Status: DC
  Administered 2019-06-06 – 2019-06-07 (×3): 25 mg via ORAL
  Filled 2019-06-06 (×3): qty 1

## 2019-06-06 MED ORDER — HYDRALAZINE HCL 25 MG PO TABS
12.5000 mg | ORAL_TABLET | Freq: Three times a day (TID) | ORAL | Status: DC
  Administered 2019-06-06: 12.5 mg via ORAL
  Filled 2019-06-06: qty 1

## 2019-06-06 MED ORDER — DULOXETINE HCL 20 MG PO CPEP
20.0000 mg | ORAL_CAPSULE | Freq: Every day | ORAL | Status: DC
  Administered 2019-06-06 – 2019-06-07 (×2): 20 mg via ORAL
  Filled 2019-06-06 (×3): qty 1

## 2019-06-06 MED ORDER — FAMOTIDINE 20 MG PO TABS
20.0000 mg | ORAL_TABLET | Freq: Every day | ORAL | Status: DC
  Administered 2019-06-06 – 2019-06-07 (×2): 20 mg via ORAL
  Filled 2019-06-06 (×2): qty 1

## 2019-06-06 NOTE — Progress Notes (Signed)
Patient discussed with Dr. Cathlean Sauer. [facility] to assume care starting 3/31

## 2019-06-06 NOTE — Progress Notes (Signed)
PULMONARY / CRITICAL CARE MEDICINE   NAME:  Tara Serrano, MRN:  [id number], DOB:  Jun 28, 1945, LOS: 1 ADMISSION DATE:  05/11/2019, CONSULTATION DATE:  05/25/2019 REFERRING MD: Cyril Mourning Ward, DO, CHIEF COMPLAINT: Altered mental status  BRIEF HISTORY:    74 y/o female with h/o A. 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 Her condition continues to improve and patient was transferred to med surg and signed out to Dr. Cathlean Sauer with [facility].  HISTORY OF PRESENT ILLNESS   AMS w/decreased appetite at home where patient seemed lethargic. Per patient, symptoms have been present for several months. She also has had on going headaches for similar duration. The patient then suffered an unwitnessed fall on 3/26, unclear if head trauma occurred however CT head indicative of chronic changes. During transport patient was found to be hypertensive to 200s/120s. Had recent change in home medications for hypertension but prescription was not yet filled for new dose of hydralazine. Labetalol gtt initiated and AMS progressed d/t systolic bps to [id number]. Labetalol weaned and AMS improving with goal systolic bps of [id number]. Remainder of vital signs currently stable. Serum creatinine: 1.2. Troponin I 49 and BNP 765.5.  She also reports checking her Bg and Bp at home daily in am prior to event, but cannot recall any readings outside her normal values.   SIGNIFICANT PAST MEDICAL HISTORY   Atrial Fibrillation Hypertension Chronic Kidney Disease Diabetes Polymyelgia Rheumatica GERD  SIGNIFICANT EVENTS:  No significant events overnight. More alert and interactive this morning.  STUDIES:   ECHO 3/29:  IMPRESSIONS: 1. Left ventricular ejection fraction, by estimation, is 60 to 65%. The  left ventricle has normal function. The left ventricle has no regional  wall motion abnormalities. There is moderate  left ventricular hypertrophy.  Left ventricular diastolic  parameters are indeterminate.  2. Right ventricular systolic function is mildly reduced. The right  ventricular size is normal. There is normal pulmonary artery systolic  pressure. The estimated right ventricular systolic pressure is 0000000 mmHg.  3. The mitral valve is normal in structure. Mild mitral valve  regurgitation.  4. The aortic valve is tricuspid. Aortic valve regurgitation is not  visualized. Mild aortic valve sclerosis is present, with no evidence of  aortic valve stenosis.  5. The inferior vena cava is normal in size with greater than 50%  respiratory variability, suggesting right atrial pressure of 3 mmHg.   MRI Brain w/o contrast 3/29:  IMPRESSION: 1. Small acute to subacute infarcts in multiple distributions suggesting recurrent central embolic disease in this patient with atrial fibrillation. 2. Patchy signal abnormality in the cerebellum, brainstem, and occipital parietal convexities. In the appropriate setting, favor posterior reversible encephalopathy syndrome. Suggest follow up to document resolution. 3. Extensive chronic small vessel ischemia.  CT Head w/o Contrast 3/29:   IMPRESSION: Chronic white matter ischemic change without acute abnormality.  Portable Chest X-ray 3/29:  IMPRESSION: 1. Mild left basilar subsegmental atelectasis. No other active cardiopulmonary disease. 2. Cardiomegaly without pulmonary edema.  CULTURES:  MRSA, COVID, FLU neg.  ANTIBIOTICS:  None  LINES/TUBES:  PIV x2, External urinary cath x1.   CONSULTANTS:  Neurology  SUBJECTIVE:  No acute events, systolic blood pressure currently at goal. Mental status continues to improve. Able to ambulate to restroom with nursing assistance. Able to eat on a regular diet and is taking PO meds.  CONSTITUTIONAL: BP (!) 149/85   Pulse 88   Temp  97.6 F (36.4 C) (Oral)   Resp 20   Ht 5' (1.524 m)   Wt 75.1 kg   SpO2  93%   BMI 32.33 kg/m   I/O last 3 completed shifts: In: 738.9 [P.O.:240; I.V.:98.9; IV Piggyback:400] Out: 650 [Urine:650]   PHYSICAL EXAM: General: Well appearing, obese female. Sitting up in bed eating breakfast in NAD Neuro: Awake, Alert and Oriented to person and place. Some degree of difficulty noted with time.  HEENT: Normocephalic and atraumatic. Moist mucus membranes. Facial presentation symmetric and congruent with body habitus, no signs of moon facies. Neck soft and supple.  Cardiovascular: RRR. Normal S1 with widely split S2. No murmurs, gallops or rubs. Peripheral DP/PT pulses palable. 2+ Peripheral edema.  Unable to assess JVP Lungs: Clear to auscultation B/L. Normal work of breathing. Abdomen: Soft. NT/ND.  Musculoskeletal: 5/5 upper and lower extremities.  Skin: Warm, pink with mild bruising noted on bilateral upper extremities.   RESOLVED PROBLEM LIST  Hypertensive encephalopathy  ASSESSMENT AND PLAN   80 female with h/o A. 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 Condition appears stable. Will consider transferring from ICU to the floor.   Hypertensive encephalopathy/emergency: symptoms of PRES vs associated CVA continues to improve. Goal systolic blood pressure [id number] D/c Labetalol 0.25 mg/min Continue oral metoprolol transition from 12.5 mg BID 25 mg BID START home hydralazine 12.5 mg TID Neurology consulted.  Palliative care to see.   Atrial Fibrillation: Continue Eliquis 5 mg BID Currently in sinus rhythm, continue to monitor  Nonischemic cardiomyopathy: H/o HFpEF, LVEF 60-65%. Fluid status improving after 1 time IV Lasix. Will continue to monitor  Diabetes: Elevation in serum glucose readings, received 2 units overnight and 1 am.  BG goal <200 mg/dL, currently in 230s Continue SSI  Polymyalgia Rheumatica: Continue prednisone 10 mg daily  GERD:    Continue home famotidine at 20 mg every other day.  Restart home Duloxetine 20 mg daily.    SUMMARY OF TODAY'S PLAN:  Neurology consult Continue to wean Labetalol gtt Advance diet as tolerated     Best Practice / Goals of Care / Disposition.   DVT PROPHYLAXIS: Currently on Eliquis, SCDs on and activated SUP: famotidine NUTRITION: Continue to Advance Diet as tolerated MOBILITY: OOB when tolerated  GOALS OF CARE: Wean Labetalol gtt and continue transition to oral medications.  FAMILY DISCUSSIONS: Family at bedside, updated today.  Palliative care consultation. LABS: Daily CBC, BMP DISPOSITION ICU  LABS  Glucose Recent Labs  Lab 06/05/19 0041 06/05/19 2242 06/06/19 0650  GLUCAP 158* 233* 141*    BMET Recent Labs  Lab 06/05/19 0024 06/05/19 0349 06/05/19 0501  NA 134* 133* 137  K 3.2* 2.8* 3.5  CL 94*  --  95*  CO2 24  --  26  BUN 19  --  20  CREATININE 1.30*  --  1.20*  GLUCOSE 186*  --  198*    Liver Enzymes Recent Labs  Lab 06/05/19 0024  AST 21  ALT 12  ALKPHOS 66  BILITOT 1.6*  ALBUMIN 2.3*    Electrolytes Recent Labs  Lab 06/05/19 0024 06/05/19 0501  CALCIUM 8.4* 8.3*  MG  --  1.8  PHOS  --  3.2    CBC Recent Labs  Lab 06/05/19 0024 06/05/19 0349 06/05/19 0501  WBC 16.0*  --  12.8*  HGB 11.6* 11.2* 11.6*  HCT 37.5 33.0* 37.6  PLT 377  --  347  ABG Recent Labs  Lab 06/05/19 0349  PHART 7.533*  PCO2ART 34.1  PO2ART 83.0    Coag's No results for input(s): APTT, INR in the last 168 hours.  Sepsis Markers No results for input(s): LATICACIDVEN, PROCALCITON, O2SATVEN in the last 168 hours.  Cardiac Enzymes No results for input(s): TROPONINI, PROBNP in the last 168 hours.   Note initiated by Carolan Shiver. Meda Coffee, MS4

## 2019-06-06 NOTE — Consult Note (Addendum)
Neurology Consultation Reason for Consult: PRES and acute ischemic stroke on MRI Referring Physician: Dr. Doyne Keel  CC: Altered mental status  History is obtained from: patient and chart review  HPI: Tara Serrano is a 74 y.o. 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  On arrival, blood pressure was 200/120.  Of note, per critical care there have been recent changes in her antihypertensive regimen.  ROS: A 14 point ROS was performed and is negative except as noted in the HPI.   Past Medical History:  Diagnosis Date  . Acute cholecystitis 11/06/2016  . Allergy   . Anemia   . Atrial fibrillation ([facility])   . Cataract   . Cholangitis   . Diabetes mellitus without complication ([facility])   . Elevated LFTs   . GERD (gastroesophageal reflux disease)   . Hyperlipidemia   . Hypertension   . Kidney disease   . Neuromuscular disorder ([facility])    polymyalgia rheumatica  . Pancreatic cyst   . PMR (polymyalgia rheumatica) ([facility])   . Pulmonary nodule   . Rib lesion   . Thyroid nodule    Family History  Problem Relation Age of Onset  . Stroke Mother   . Diabetes Mother   . Hypertension Mother   . Chronic Renal Failure Father   . Other Sister        Glioblastoma  . Esophageal cancer Brother   . Melanoma Brother   . Colon cancer Neg Hx   . Colon polyps Neg Hx   . Rectal cancer Neg Hx   . Stomach cancer Neg Hx    Social History:  reports that she has never smoked. She has never used smokeless tobacco. She reports that she does not drink alcohol or use drugs.   Exam: Current vital signs: BP (!) 153/86   Pulse 88   Temp 97.7 F (36.5 C) (Oral)   Resp (!) 27   Ht 5' (1.524 m)   Wt 75.1 kg   SpO2 94%   BMI 32.33 kg/m  Vital signs  in last 24 hours: Temp:  [96.7 F (35.9 C)-97.8 F (36.6 C)] 97.7 F (36.5 C) (03/30 1214) Pulse Rate:  [78-92] 88 (03/30 0832) Resp:  [16-31] 27 (03/30 0832) BP: (121-174)/(60-125) 153/86 (03/30 0832) SpO2:  [90 %-96 %] 94 % (03/30 0832) Weight:  [75.1 kg] 75.1 kg (03/30 0330)   Physical Exam  Constitutional: Appears well-developed and well-nourished.  Psych: Affect appropriate to situation Eyes: No scleral injection HENT: No OP obstrucion Head: Normocephalic.  Cardiovascular: Normal rate and regular rhythm.  Respiratory: Effort normal, non-labored breathing GI: Soft.  No distension. There is no tenderness.  Skin: WDI  Neuro: Mental Status: Patient is awake, alert, oriented to person, place, month, year, and situation. Patient is able to give a clear and coherent history No signs of aphasia or neglect Cranial Nerves: II: Visual Fields are full. Pupils are equal, round, and reactive to light.  III,IV, VI: EOMI without ptosis or diploplia.  V: Facial sensation is symmetric to temperature VII: Facial movement is symmetric.  VIII: hearing is intact to voice X: Uvula elevates symmetrically XI: Shoulder shrug is symmetric. XII: tongue is midline without atrophy or fasciculations.  Motor: Tone is normal. Bulk is normal. 5/5 strength was present in all four extremities.   I have  reviewed labs in epic and the results pertinent to this consultation are: WBC 16-->12.8 Hemoglobin 11.2-->11.6 BUN 26 with creatinine 1.46 today.  Normal BUN with creatinine 1.2 yesterday.  GFR 35   I have reviewed the images obtained: MRI brain without contrast 06/05/2019: 1. Small acute to subacute infarcts in multiple distributions suggesting recurrent central embolic disease in this patient with atrial fibrillation. 2. Patchy signal abnormality in the cerebellum, brainstem, and occipital parietal convexities. In the appropriate setting, favor posterior reversible encephalopathy syndrome. Suggest  follow up to document resolution. 3. Extensive chronic small vessel ischemia.     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  States she has carotid ultrasound scheduled at [facility] already.  Denies any headache, vision changes, weakness.  Recommendations -Recommend strict blood pressure control with goal SBP less than 140 -Patient has multiple acute strokes likely secondary to her atrial fibrillation.  Recommend continuing anticoagulation. 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  Already on Eliquis therefore will not start antiplatelets at this point. -Echocardiogram performed yesterday showed ejection fraction of 60 to 65%.  No evidence of thrombus. -Patient saw Dr. Cammy Brochure at [facility] on 05/04/2019 and already has carotid ultrasound scheduled.  Continue follow-up with Dr. Lynnette Caffey. -Repeat MRI brain without contrast in 4 to 6 weeks after discharge to assess for resolution of acute MRI changes -Does not need further inpatient work-up at this point -Discussed importance of blood pressure control, modification of stroke risk factors, signs of acute stroke (BEFAST), importance of medication compliance at length with patient.  Thank you for allowing [place] to participate in the care of this patient.  Neurology will sign off.  Please call us if you have any further questions.  [place] Lintner Barbra Sarks

## 2019-06-06 NOTE — Progress Notes (Signed)
Patient transfered form ICU due to needs for continuation of care. Patient oriented to room, skin check completed. Open skin tear on L side of chest noted. Pt resting up in the recliner chair with call bell at hand. Family at bedside

## 2019-06-07 DIAGNOSIS — D849 Immunodeficiency, unspecified: Secondary | ICD-10-CM

## 2019-06-07 DIAGNOSIS — Z515 Encounter for palliative care: Secondary | ICD-10-CM

## 2019-06-07 DIAGNOSIS — E119 Type 2 diabetes mellitus without complications: Secondary | ICD-10-CM

## 2019-06-07 DIAGNOSIS — I4819 Other persistent atrial fibrillation: Secondary | ICD-10-CM

## 2019-06-07 DIAGNOSIS — Z7189 Other specified counseling: Secondary | ICD-10-CM

## 2019-06-07 DIAGNOSIS — I4891 Unspecified atrial fibrillation: Secondary | ICD-10-CM

## 2019-06-07 LAB — GLUCOSE, CAPILLARY
Glucose-Capillary: 152 mg/dL — ABNORMAL HIGH (ref 70–99)
Glucose-Capillary: 159 mg/dL — ABNORMAL HIGH (ref 70–99)
Glucose-Capillary: 219 mg/dL — ABNORMAL HIGH (ref 70–99)
Glucose-Capillary: 267 mg/dL — ABNORMAL HIGH (ref 70–99)

## 2019-06-07 MED ORDER — HYDRALAZINE HCL 20 MG/ML IJ SOLN
10.0000 mg | INTRAMUSCULAR | Status: DC | PRN
  Administered 2019-06-07: 10 mg via INTRAVENOUS
  Filled 2019-06-07: qty 1

## 2019-06-07 MED ORDER — HYDRALAZINE HCL 20 MG/ML IJ SOLN
INTRAMUSCULAR | Status: AC
  Filled 2019-06-07: qty 1

## 2019-06-07 MED ORDER — METOPROLOL TARTRATE 50 MG PO TABS
50.0000 mg | ORAL_TABLET | Freq: Two times a day (BID) | ORAL | Status: DC
  Administered 2019-06-07: 50 mg via ORAL
  Filled 2019-06-07: qty 1

## 2019-06-07 NOTE — Progress Notes (Signed)
PROGRESS NOTE  Cerissa Zeiger UUE:[id number] DOB: 05-19-1945 DOA: 05/10/2019 PCP: Ivan Anchors, MD   Brief summary:  74 year old woman with history of atrial fibrillation on Eliquis, hypertension, CKD, diabetes, GERD, polymyalgia rheumatica presenting with generalized weakness, fatigue, acute encephalopathy.  Reported recent fall.  Found to be hypertensive with BP 200/120s en route.  She was somnolent but opens eyes to voice and nods to questioning initially  She was admitted to ICU initially, MRI brain showed small acute to subacute infarct in multiple distributions. 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 Lynnette Caffey, recommend repeat MRI in 4 to 6 weeks  Per ICU attending discussion with patient's sister who report progressive functional decline over the last year, patient is having difficulties in taking care of herself and may be noncompliant with medical therapy.  ICU consulted palliative care for goals of care.  HPI/Recap of past 24 hours:  She denies pain, reports feeling better She is not a reliable historian, confused about time and place  Assessment/Plan: Active Problems:   Hypertensive emergency   Palliative care by specialist   Goals of care, counseling/discussion    hypertension emergency/ press/  multifocal embolic disease/acute metabolic encephalopathy -she was admitted to icu on labetalol drip, improving -neurology input appreciated Continue adjust bp meds to gradually achieve bp control, increase lopressor, continue hydralazine   Afib, chronic On lopressor, eliquis  Chronic diastolic chf -currently euvolemic  Polymyalgia Rheumatica/immunosuppressed status Continue prednisone 10 mg daily  noninsulin dependent diabetes Possible prednisone induced Currently on ssi  Body mass index is 33.71 kg/m.  DVT Prophylaxis:eliquis  Code Status: full Family Communication:  patient   Disposition Plan:    Patient came from:        home                                                                                                  Anticipated d/c place: TBD  Barriers to d/c OR conditions which need to be met to effect a safe d/c: pending clinical improvement, pending palliative care consult   Consultants:  Critical care  Neurology  Palliative care  PT/OT/speech  Procedures:  none  Antibiotics:  none MRSA, COVID, FLU neg  Objective: BP (!) 174/96   Pulse 91   Temp (!) 97.5 F (36.4 C) (Oral)   Resp 20   Ht 5' (1.524 m)   Wt 78.3 kg   SpO2 96%   BMI 33.71 kg/m   Intake/Output Summary (Last 24 hours) at 06/07/2019 1854 Last data filed at 06/07/2019 1705 Gross per 24 hour  Intake 120 ml  Output 300 ml  Net -180 ml   Filed Weights   06/05/19 0445 06/06/19 0330 06/07/19 0146  Weight: 73.8 kg 75.1 kg 78.3 kg    Exam: Patient is examined daily including today on 06/07/2019, exams remain the same as of yesterday except that has changed    General:  NAD, appear weak  Cardiovascular: IRRR  Respiratory: CTABL  Abdomen: Soft/ND/NT, positive BS  Musculoskeletal: No Edema,   Neuro: alert, oriented to  person, she knows it is 2021 but confused about the month, she knows she is in the hospital but not able to name the hospital  Data Reviewed: Basic Metabolic Panel: Recent Labs  Lab 06/05/19 0024 06/05/19 0349 06/05/19 0501 06/06/19 1000  NA 134* 133* 137 135  K 3.2* 2.8* 3.5 3.8  CL 94*  --  95* 95*  CO2 24  --  26 27  GLUCOSE 186*  --  198* 257*  BUN 19  --  20 26*  CREATININE 1.30*  --  1.20* 1.46*  CALCIUM 8.4*  --  8.3* 8.2*  MG  --   --  1.8  --   PHOS  --   --  3.2  --    Liver Function Tests: Recent Labs  Lab 06/05/19 0024  AST 21  ALT 12  ALKPHOS 66  BILITOT 1.6*  PROT 5.2*  ALBUMIN 2.3*   No results for input(s): LIPASE, AMYLASE in the last 168 hours. Recent Labs  Lab 06/05/19 0028  AMMONIA 11    CBC: Recent Labs  Lab 06/05/19 0024 06/05/19 0349 06/05/19 0501 06/06/19 1000  WBC 16.0*  --  12.8* 10.1  NEUTROABS  --   --  11.0*  --   HGB 11.6* 11.2* 11.6* 10.6*  HCT 37.5 33.0* 37.6 35.3*  MCV 87.6  --  86.4 89.1  PLT 377  --  347 367   Cardiac Enzymes:   Recent Labs  Lab 06/05/19 0024  CKTOTAL 94   BNP (last 3 results) Recent Labs    06/05/19 0501  BNP 765.5*    ProBNP (last 3 results) No results for input(s): PROBNP in the last 8760 hours.  CBG: Recent Labs  Lab 06/06/19 1642 06/06/19 2110 06/07/19 0608 06/07/19 1112 06/07/19 1704  GLUCAP 252* 312* 152* 159* 219*    Recent Results (from the past 240 hour(s))  Respiratory Panel by RT PCR (Flu A&B, Covid) - Nasopharyngeal Swab     Status: None   Collection Time: 06/05/19  2:02 AM   Specimen: Nasopharyngeal Swab  Result Value Ref Range Status   SARS Coronavirus 2 by RT PCR NEGATIVE NEGATIVE Final    Comment: (NOTE) SARS-CoV-2 target nucleic acids are NOT DETECTED. The SARS-CoV-2 RNA is generally detectable in upper respiratoy specimens during the acute phase of infection. The lowest concentration of SARS-CoV-2 viral copies this assay can detect is 131 copies/mL. A negative result does not preclude SARS-Cov-2 infection and should not be used as the sole basis for treatment or other patient management decisions. A negative result may occur with  improper specimen collection/handling, submission of specimen other than nasopharyngeal swab, presence of viral mutation(s) within the areas targeted by this assay, and inadequate number of viral copies (<131 copies/mL). A negative result must be combined with clinical observations, patient history, and epidemiological information. The expected result is Negative. Fact Sheet for Patients:  PinkCheek.be Fact Sheet for Healthcare Providers:  GravelBags.it This test is not yet ap proved or cleared by  the [place] FDA and  has been authorized for detection and/or diagnosis of SARS-CoV-2 by FDA under an Emergency Use Authorization (EUA). This EUA will remain  in effect (meaning this test can be used) for the duration of the COVID-19 declaration under Section 564(b)(1) of the Act, 21 U.S.C. section 360bbb-3(b)(1), unless the authorization is terminated or revoked sooner.    Influenza A by PCR NEGATIVE NEGATIVE Final   Influenza B by PCR NEGATIVE NEGATIVE Final    Comment: (  NOTE) The Xpert Xpress SARS-CoV-2/FLU/RSV assay is intended as an aid in  the diagnosis of influenza from Nasopharyngeal swab specimens and  should not be used as a sole basis for treatment. Nasal washings and  aspirates are unacceptable for Xpert Xpress SARS-CoV-2/FLU/RSV  testing. Fact Sheet for Patients: PinkCheek.be Fact Sheet for Healthcare Providers: GravelBags.it This test is not yet approved or cleared by the [place] FDA and  has been authorized for detection and/or diagnosis of SARS-CoV-2 by  FDA under an Emergency Use Authorization (EUA). This EUA will remain  in effect (meaning this test can be used) for the duration of the  Covid-19 declaration under Section 564(b)(1) of the Act, 21  U.S.C. section 360bbb-3(b)(1), unless the authorization is  terminated or revoked. Performed at [facility], [place] [address], [place], [place] [address]   MRSA PCR Screening     Status: None   Collection Time: 06/05/19  5:13 AM   Specimen: Nasopharyngeal  Result Value Ref Range Status   MRSA by PCR NEGATIVE NEGATIVE Final    Comment:        The GeneXpert MRSA Assay (FDA approved for NASAL specimens only), is one component of a comprehensive MRSA colonization surveillance program. It is not intended to diagnose MRSA infection nor to guide or monitor treatment for MRSA infections. Performed at [facility], [place] [address]  [place], [place] [address]      Studies: No results found.  Scheduled Meds: . apixaban  5 mg Oral BID  . Chlorhexidine Gluconate Cloth  6 each Topical Daily  . DULoxetine  20 mg Oral Daily  . famotidine  20 mg Oral Daily  . fluticasone  1 spray Each Nare Daily  . hydrALAZINE  12.5 mg Oral Q8H  . insulin aspart  0-5 Units Subcutaneous QHS  . insulin aspart  0-9 Units Subcutaneous TID WC  . mouth rinse  15 mL Mouth Rinse BID  . metoprolol tartrate  25 mg Oral BID  . predniSONE  10 mg Oral Daily    Continuous Infusions: . labetalol (NORMODYNE) infusion Stopped (06/06/19 1121)     Time spent: 80mns I have personally reviewed and interpreted on  06/07/2019 daily labs, imagings as discussed above under date review session and assessment and plans.  I reviewed all nursing notes, pharmacy notes, consultant notes,  vitals, pertinent old records  I have discussed plan of care as described above with RN , patient  on 06/07/2019   FFlorencia ReasonsMD, PhD, FACP  [facility]  Available via Epic secure chat 7am-7pm for nonurgent issues Please page for urgent issues, pager number available through [email or web address] .   06/07/2019, 6:54 PM  LOS: 2 days

## 2019-06-07 NOTE — Progress Notes (Signed)
[facility] Progress Note Patient Name: Khi Prestwich DOB: November 07, 1945 MRN: [id number]   Date of Service  06/07/2019  HPI/Events of Note  Hypertension - BP = 200/103.   eICU Interventions  Will order: 1. Hydralazine 10 mg IV Q 4 hours PRN SBP > 170 or DBP > 100.      Intervention Category Major Interventions: Hypertension - evaluation and management  Lysle Dingwall 06/07/2019, 4:38 AM

## 2019-06-07 NOTE — Evaluation (Signed)
Physical Therapy Evaluation Patient Details Name: Tara Serrano MRN: [id number] DOB: 1946/02/06 Today's Date: 06/07/2019   History of Present Illness  Pt is a 74 y/o female admitted secondary to lethargy and AMS, found to have PRES. 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 PMH including but not limited to HTN, a-fib, DM and polymyalgia rheumatica.    Clinical Impression  Pt presented supine in bed with HOB elevated, awake and willing to participate in therapy session. Prior to admission, pt reported that she was independent with all functional mobility and ADLs. Pt lives with her son and grandson in a single level home with a few steps to enter. At the time of evaluation, pt very limited overall secondary to elevated BP. At beginning of session, pt's BP in supine was 191/92 mmHg and sitting EOB BP was 200/98 mmHg. Therefore, further mobility was deferred. Pt's RN was notified. Pt would continue to benefit from skilled physical therapy services at this time while admitted and after d/c to address the below listed limitations in order to improve overall safety and independence with functional mobility.     Follow Up Recommendations Home health PT;Supervision/Assistance - 24 hour    Equipment Recommendations  Other (comment)(pending progress with mobility)    Recommendations for Other Services       Precautions / Restrictions Precautions Precautions: Fall Precaution Comments: monitor BP Restrictions Weight Bearing Restrictions: No      Mobility  Bed Mobility Overal bed mobility: Needs Assistance Bed Mobility: Supine to Sit;Sit to Supine     Supine to sit: Min assist Sit to supine: Min assist   General bed mobility comments: increased time and effort, use of bed rail, multimodal cueing for sequencing and to perform; min A needed for trunk elevation and to return bilateral LEs onto  bed  Transfers                 General transfer comment: deferred secondary to elevated BP  Ambulation/Gait                Stairs            Wheelchair Mobility    Modified Rankin (Stroke Patients Only) Modified Rankin (Stroke Patients Only) Pre-Morbid Rankin Score: No symptoms Modified Rankin: Moderately severe disability     Balance                                             Pertinent Vitals/Pain Pain Assessment: No/denies pain    Home Living Family/patient expects to be discharged to:: Private residence Living Arrangements: Children Available Help at Discharge: Family;Available 24 hours/day Type of Home: House Home Access: Stairs to enter Entrance Stairs-Rails: None Entrance Stairs-Number of Steps: 3 Home Layout: One level Home Equipment: None      Prior Function Level of Independence: Independent         Comments: drives, wears glasses     Hand Dominance        Extremity/Trunk Assessment   Upper Extremity Assessment Upper Extremity Assessment: Generalized weakness    Lower Extremity Assessment Lower Extremity Assessment: Generalized weakness       Communication   Communication: No difficulties  Cognition Arousal/Alertness: Awake/alert Behavior During Therapy: Flat affect Overall Cognitive Status: Impaired/Different from baseline Area of Impairment: Problem solving;Following commands  Following Commands: Follows one step commands with increased time;Follows multi-step commands with increased time     Problem Solving: Slow processing;Decreased initiation;Requires verbal cues        General Comments      Exercises     Assessment/Plan    PT Assessment Patient needs continued PT services  PT Problem List Decreased strength;Decreased activity tolerance;Decreased balance;Decreased mobility;Decreased coordination;Decreased cognition;Decreased knowledge of use of  DME;Decreased safety awareness;Decreased knowledge of precautions;Cardiopulmonary status limiting activity       PT Treatment Interventions DME instruction;Gait training;Stair training;Functional mobility training;Therapeutic activities;Therapeutic exercise;Balance training;Neuromuscular re-education;Cognitive remediation;Patient/family education    PT Goals (Current goals can be found in the Care Plan section)  Acute Rehab PT Goals Patient Stated Goal: return home PT Goal Formulation: With patient Time For Goal Achievement: 06/21/19 Potential to Achieve Goals: Good    Frequency Min 4X/week   Barriers to discharge        Co-evaluation               AM-PAC PT "6 Clicks" Mobility  Outcome Measure Help needed turning from your back to your side while in a flat bed without using bedrails?: A Little Help needed moving from lying on your back to sitting on the side of a flat bed without using bedrails?: A Little Help needed moving to and from a bed to a chair (including a wheelchair)?: A Little Help needed standing up from a chair using your arms (e.g., wheelchair or bedside chair)?: A Little Help needed to walk in hospital room?: A Little Help needed climbing 3-5 steps with a railing? : A Lot 6 Click Score: 17    End of Session   Activity Tolerance: Treatment limited secondary to medical complications (Comment);Other (comment)(BP significantly elevated) Patient left: in bed;with call bell/phone within reach;with bed alarm set Nurse Communication: Mobility status;Other (comment)(elevated BP) PT Visit Diagnosis: Other abnormalities of gait and mobility (R26.89)    Time: [id number] PT Time Calculation (min) (ACUTE ONLY): 18 min   Charges:   PT Evaluation $PT Eval Moderate Complexity: 1 Mod          Eduard Clos, PT, DPT  Acute Rehabilitation Services Pager [phone] Office [place] 06/07/2019, 4:43 PM

## 2019-06-07 NOTE — Consult Note (Signed)
Consultation Note Date: 06/07/19  Patient Name: Tara Serrano  DOB: 1945/09/25  MRN: [id number]  Age / Sex: 74 y.o., female  PCP: Ivan Anchors, MD Referring Physician: Florencia Reasons, MD  Reason for Consultation: Establishing goals of care  HPI/Patient Profile: 74 y.o. female  with past medical history of atrial fibrillation on Eliquis, hypertension, HFpEF, DM, HLD, polymyalgia rheumatica, CKD admitted on 06/06/2019 with increased lethargy. Initial blood pressure in ED 239/108 started on cardene gtt with minimal improvement, changed to labetalol gtt. MRI brain revealed small acute to subacute infarcts. Neurology following and recommended to continue anticoagulation, no statin due to myopathy, and outpatient neurology f/u with Dr. Lynnette Caffey for repeat MRI in 4-6 weeks. PT consult pending. Palliative medicine consultation for goals of care  Clinical Assessment and Goals of Care:  I have reviewed medical records, discussed with care team, and met with patient at bedside. No family present. Patient is oriented to person/place. Reoriented her to situation and time. She is able to answer simple questions but with some mild confusion. Mostly 'yes/no' responses. Denies pain or discomfort.   I introduced Palliative Medicine as specialized medical care for people living with serious illness. It focuses on providing relief from the symptoms and stress of a serious illness. The goal is to improve quality of life for both the patient and the family.  Discussed events leading up to admission and course of hospitalization including diagnoses, interventions, plan of care. Patient is eating well inpatient. She is agreeable to work with physical therapy today.    Briefly explored whether or not patient has advanced directive. She shares that her current advance directive has POA's that are deceased and she needs to update this. She  shares she would wish for daughter, son, and grandson to make medical decisions for her if she was unable to do so. When asked about life support measures, the patient nods her head 'no.' Encouraged early discussions with her family regarding her wishes and update documentation of POA's and her wishes. Explained that I would call and discuss with her daughter too. Patient agreeable.    **Shortly after, spoke with daughter Tara Serrano) via telephone to discuss goals of care. Introduced role of palliative medicine.   We discussed a brief life review of the patient. Widowed. Lives with son and grandson. 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 Per daughter, independent but has been slightly more forgetful lately. Tara Serrano shares that her mother is stubborn and has always been a private individual in regards to her health and financial affairs.  Discussed events leading up to admission and course of hospitalization including diagnoses, interventions, plan of care and recommendations for neurology. Discussed high risk for future stroke with underlying chronic conditions and importance of medication compliance. Daughter understands she may need to be more involved in her mother's health and financial matters. Discussed PT evaluation and that they may recommend SNF rehab.   Tara Serrano also confirms that her mother has a documented living will but  POA's are deceased. Tara Serrano understands importance of documenting an updated advance directive and plans to discuss her mother's wishes at bedside this evening when she visits. Explained AD packed and MOST form that I will leave at bedside, explaining that these documents can be completed here if they are ready. Daughter understands and is Patent attorney. PMT contact information left and shared that I would f/u with Tara Serrano tomorrow about these documents. Answered questions and explained care plan for today.    SUMMARY OF  RECOMMENDATIONS    Continue full code/full scope and current plan of care.   Initial palliative discussion with patient and daughter. Introduced and discussed AD packet and MOST form. Daughter plans to further discuss these documents with her mother this evening and understands the importance of updating an AD packet and discussing her mother's wishes. PMT contact information given. Daughter understands these documents can be completed this hospitalization if they are ready.   PMT will follow inpatient.   Code Status/Advance Care Planning:  Full code  Symptom Management:   Per attending  Palliative Prophylaxis:   Aspiration and Delirium Protocol  Psycho-social/Spiritual:   Desire for further Chaplaincy support: yes  Additional Recommendations: Caregiving  Support/Resources  Prognosis:   Unable to determine  Discharge Planning: To Be Determined      Primary Diagnoses: Present on Admission: . Hypertensive emergency   I have reviewed the medical record, interviewed the patient and family, and examined the patient. The following aspects are pertinent.  Past Medical History:  Diagnosis Date  . Acute cholecystitis 11/06/2016  . Allergy   . Anemia   . Atrial fibrillation ([place])   . Cataract   . Cholangitis   . Diabetes mellitus without complication ([place])   . Elevated LFTs   . GERD (gastroesophageal reflux disease)   . Hyperlipidemia   . Hypertension   . Kidney disease   . Neuromuscular disorder ([facility])    polymyalgia rheumatica  . Pancreatic cyst   . PMR (polymyalgia rheumatica) ([facility])   . Pulmonary nodule   . Rib lesion   . Thyroid nodule    Social History   Socioeconomic History  . Marital status: Married    Spouse name: Not on file  . Number of children: 1  . Years of education: Not on file  . Highest education level: Not on file  Occupational History  . Occupation: Medical sales representative  Tobacco Use  . Smoking status: Never Smoker  . Smokeless tobacco: Never Used   Substance and Sexual Activity  . Alcohol use: No  . Drug use: No  . Sexual activity: Not on file  Other Topics Concern  . Not on file  Social History Narrative   Lives with spouse in [place].   Social Determinants of Health   Financial Resource Strain:   . Difficulty of Paying Living Expenses:   Food Insecurity:   . Worried About Charity fundraiser in the Last Year:   . Arboriculturist in the Last Year:   Transportation Needs:   . Film/video editor (Medical):   Marland Kitchen Lack of Transportation (Non-Medical):   Physical Activity:   . Days of Exercise per Week:   . Minutes of Exercise per Session:   Stress:   . Feeling of Stress :   Social Connections:   . Frequency of Communication with Friends and Family:   . Frequency of Social Gatherings with Friends and Family:   . Attends Religious Services:   . Active Member of Clubs or Organizations:   .  Attends Archivist Meetings:   Marland Kitchen Marital Status:    Family History  Problem Relation Age of Onset  . Stroke Mother   . Diabetes Mother   . Hypertension Mother   . Chronic Renal Failure Father   . Other Sister        Glioblastoma  . Esophageal cancer Brother   . Melanoma Brother   . Colon cancer Neg Hx   . Colon polyps Neg Hx   . Rectal cancer Neg Hx   . Stomach cancer Neg Hx    Scheduled Meds: . apixaban  5 mg Oral BID  . Chlorhexidine Gluconate Cloth  6 each Topical Daily  . DULoxetine  20 mg Oral Daily  . famotidine  20 mg Oral Daily  . fluticasone  1 spray Each Nare Daily  . hydrALAZINE  12.5 mg Oral Q8H  . insulin aspart  0-5 Units Subcutaneous QHS  . insulin aspart  0-9 Units Subcutaneous TID WC  . mouth rinse  15 mL Mouth Rinse BID  . metoprolol tartrate  25 mg Oral BID  . predniSONE  10 mg Oral Daily   Continuous Infusions: . labetalol (NORMODYNE) infusion Stopped (06/06/19 1121)   PRN Meds:.hydrALAZINE Medications Prior to Admission:  Prior to Admission medications   Medication Sig Start Date  End Date Taking? Authorizing Provider  apixaban (ELIQUIS) 5 MG TABS tablet Take 5 mg by mouth 2 (two) times daily.   Yes [provider]  Blood Glucose Monitoring Suppl (ACCU-CHEK GUIDE) w/Device KIT 1 each by Other route in the morning and at bedtime. 05/03/19  Yes [provider]  Cholecalciferol 25 MCG (1000 UT) tablet Take 1 tablet by mouth daily. 03/01/19  Yes [provider]  DULoxetine (CYMBALTA) 20 MG capsule Take 20 mg by mouth daily.   Yes [provider]  famotidine (PEPCID) 20 MG tablet Take 2 tablets (40 mg total) by mouth 2 (two) times daily. Patient taking differently: Take 40 mg by mouth every other day.  05/04/18  Yes Danis, Estill Cotta III, MD  ferrous sulfate 325 (65 FE) MG tablet Take 325 mg by mouth daily with breakfast.   Yes [provider]  fluticasone (FLONASE) 50 MCG/ACT nasal spray Place 2 sprays into both nostrils daily.  03/26/19  Yes [provider]  furosemide (LASIX) 20 MG tablet Take 1 tablet (20 mg total) by mouth daily as needed. Patient will only take if instructed for Alleviate HF research protocol. 05/17/19 05/11/20 Yes Allred, Jeneen Rinks, MD  glucose blood (ACCU-CHEK GUIDE) test strip 1 each by Other route in the morning and at bedtime. 05/04/19  Yes [provider]  hydrALAZINE (APRESOLINE) 50 MG tablet Take 50 mg by mouth 3 (three) times daily.  04/02/19  Yes [provider]  linagliptin (TRADJENTA) 5 MG TABS tablet Take 5 mg by mouth daily.   Yes [provider]  lisinopril (ZESTRIL) 20 MG tablet Take 20 mg by mouth in the morning and at bedtime.   Yes [provider]  magnesium oxide (MAG-OX) 400 (241.3 Mg) MG tablet Take 1 tablet by mouth daily. 04/02/19  Yes [provider]  metoprolol succinate (TOPROL-XL) 100 MG 24 hr tablet Take 100 mg by mouth in the morning and at bedtime. Take with or immediately following a meal.   Yes [provider]  predniSONE (DELTASONE) 1  MG tablet Take 8 mg by mouth daily.    Yes [provider]  senna-docusate (SENOKOT-S) 8.6-50 MG tablet Take 1  tablet by mouth 2 (two) times daily as needed for mild constipation.  04/02/19 04/01/20 Yes [provider]  cefdinir (OMNICEF) 300 MG capsule Take 300 mg by mouth 2 (two) times daily.    [provider]  spironolactone (ALDACTONE) 25 MG tablet Take 25 mg by mouth daily.    [provider]   Allergies  Allergen Reactions  . Lipitor [Atorvastatin]     Muscle aches  . Metformin And Related Nausea Only  . Pantoprazole     Headaches and diarrhea.   . Statins Other (Tara Comments)    Muscle problems  . Terazosin     Cough, fatigue.   Marland Kitchen Zetia [Ezetimibe]     Muscle aches   Review of Systems  Unable to perform ROS: Acuity of condition   Physical Exam Vitals and nursing note reviewed.  Constitutional:      General: She is awake.  HENT:     Head: Normocephalic and atraumatic.  Pulmonary:     Effort: No tachypnea, accessory muscle usage or respiratory distress.  Neurological:     Mental Status: She is alert.     Comments: Oriented to person/place. Reoriented to time/situation    Vital Signs: BP (!) 182/99 (BP Location: Left Arm)   Pulse 91   Temp (!) 97.5 F (36.4 C) (Oral)   Resp 20   Ht 5' (1.524 m)   Wt 78.3 kg   SpO2 96%   BMI 33.71 kg/m  Pain Scale: 0-10   Pain Score: 0-No pain   SpO2: SpO2: 96 % O2 Device:SpO2: 96 % O2 Flow Rate: .   IO: Intake/output summary:   Intake/Output Summary (Last 24 hours) at 06/07/2019 1637 Last data filed at 06/07/2019 1253 Gross per 24 hour  Intake 220 ml  Output 300 ml  Net -80 ml    LBM: Last BM Date: 06/06/19 Baseline Weight: Weight: 79.4 kg Most recent weight: Weight: 78.3 kg     Palliative Assessment/Data: PPS 50%   Flowsheet Rows     Most Recent Value  Intake Tab  Referral Department  Hospitalist  Unit at Time of Referral  Cardiac/Telemetry Unit  Palliative Care Primary  Diagnosis  Other (Comment)  Palliative Care Type  New Palliative care  Reason for referral  Clarify Goals of Care  Date first seen by Palliative Care  06/07/19  Clinical Assessment  Palliative Performance Scale Score  50%  Psychosocial & Spiritual Assessment  Palliative Care Outcomes  Patient/Family meeting held?  Yes  Who was at the meeting?  Patient briefly. Spoke with daughter via telephone  Palliative Care Outcomes  Clarified goals of care, Provided end of life care assistance, Provided psychosocial or spiritual support, ACP counseling assistance, Linked to palliative care logitudinal support      Time In: 1015 Time Out: 1115 Time Total: 66mn Greater than 50%  of this time was spent counseling and coordinating care related to the above assessment and plan.  Signed by:   MIhor Dow DNP, FNP-C Palliative Medicine Team  Phone: [phone]: [phone]  Please contact Palliative Medicine Team phone at [phone] questions and concerns.  For individual provider: See AShea Evans

## 2019-06-07 NOTE — Progress Notes (Signed)
Inpatient Diabetes Program Recommendations  AACE/ADA: New Consensus Statement on Inpatient Glycemic Control   Target Ranges:  Prepandial:   less than 140 mg/dL      Peak postprandial:   less than 180 mg/dL (1-2 hours)      Critically ill patients:  140 - 180 mg/dL   Results for Tara Serrano, Tara Serrano (MRN [id number]) as of 06/07/2019 10:51  Ref. Range 06/06/2019 06:50 06/06/2019 12:13 06/06/2019 16:42 06/06/2019 21:10 06/07/2019 06:08  Glucose-Capillary Latest Ref Range: 70 - 99 mg/dL 141 (H) 239 (H) 252 (H) 312 (H) 152 (H)   Review of Glycemic Control  Diabetes history: DM2 Outpatient Diabetes medications: Tradjenta 5 mg daily Current orders for Inpatient glycemic control: Novolog 0-9 units TID with meals, Novolog 0-5 units QHS; Prednisone 10 mg daily  Inpatient Diabetes Program Recommendations:   Oral Agents: Post prandial glucose is consistently elevated.  May want to consider resuming Tradjenta 5 mg daily (as taken as an outpatient) to help improve inpatient glycemic control.  HgbA1C: A1C 7.7% on 06/05/19 indicating an average glucose of 174 mg/dl over the past 2-3 months.  Thanks, Barnie Alderman, RN, MSN, CDE Diabetes Coordinator Inpatient Diabetes Program [phone] (Team Pager from 8am to 5pm)

## 2019-06-08 ENCOUNTER — Inpatient Hospital Stay (HOSPITAL_COMMUNITY): Payer: Medicare Other

## 2019-06-08 DIAGNOSIS — I629 Nontraumatic intracranial hemorrhage, unspecified: Secondary | ICD-10-CM

## 2019-06-08 DIAGNOSIS — K117 Disturbances of salivary secretion: Secondary | ICD-10-CM

## 2019-06-08 DIAGNOSIS — R0682 Tachypnea, not elsewhere classified: Secondary | ICD-10-CM

## 2019-06-08 LAB — GLUCOSE, CAPILLARY: Glucose-Capillary: 248 mg/dL — ABNORMAL HIGH (ref 70–99)

## 2019-06-08 MED ORDER — SCOPOLAMINE 1 MG/3DAYS TD PT72
1.0000 | MEDICATED_PATCH | TRANSDERMAL | Status: DC
  Filled 2019-06-08: qty 1

## 2019-06-08 MED ORDER — HYDROMORPHONE HCL 1 MG/ML IJ SOLN
0.5000 mg | INTRAMUSCULAR | Status: DC | PRN
  Administered 2019-06-08 (×2): 1 mg via INTRAVENOUS
  Filled 2019-06-08 (×2): qty 1

## 2019-06-08 MED ORDER — GLYCOPYRROLATE 0.2 MG/ML IJ SOLN
0.2000 mg | INTRAMUSCULAR | Status: DC | PRN
  Administered 2019-06-08: 11:00:00 0.2 mg via INTRAVENOUS
  Filled 2019-06-08: qty 1

## 2019-06-08 MED ORDER — MORPHINE 100MG IN NS 100ML (1MG/ML) PREMIX INFUSION: 1.0000 mg/h | INTRAVENOUS | Status: DC

## 2019-06-08 MED ORDER — LORAZEPAM 2 MG/ML IJ SOLN
0.5000 mg | Freq: Four times a day (QID) | INTRAMUSCULAR | Status: DC | PRN
  Administered 2019-06-08: 11:00:00 0.5 mg via INTRAVENOUS
  Filled 2019-06-08: qty 1

## 2019-06-08 DEATH — deceased

## 2019-06-14 ENCOUNTER — Other Ambulatory Visit: Payer: Medicare Other

## 2019-06-15 ENCOUNTER — Ambulatory Visit: Payer: Medicare Other | Admitting: Hematology

## 2019-07-08 NOTE — Significant Event (Signed)
Shift event: RN paged due to pt being found unresponsive with no response to sternal rub and unequal pupils. Told RN to call Code Stroke and ordered stat CT head and NP went to bedside.  S: Pt can not participate in ROS due to mental status. Per RN, last seen normal (for pt) around 0100 hrs. Was easily arousable from sleep, oriented to person and year only which has been her normal.  O: Upon arrival, pt lying with HOB at about 30 degrees. BP 240. HR 90s. RR 16, snoring. Eyes closed. Right pupil 43mm, left pupil blown. No response to threat. No response except withdrawal to pain. Non verbal. Breathing irregular. Card: tachycardic, S1S2.  A/P: 1. Acute change in mental status which suspect is brain bleed. Called neuro to meet [place] in CT. Taken to CT emergently and confirmed bleed read by Dr. Leonel Ramsay, neuro. Will need tight BP control, intubation and ICU. Spoke to critical care about case. Ground team unavailable and will need anesthesia to intubate. Neuro calling family-daughter informed of situation and code status discussed. Per daughter, she had a conversation with pt yesterday about living on life support and pt expressed NOT wanting this. When informed of gravity of situation and fact that pt will not survive this, daughter elected DNR and comfort care. Critical care informed of comfort care. Pt transported back to 3W and appropriate orders placed for comfort. Daughter on her way to the hospital. [facility], NP [facility] Performed by: Gardiner Barefoot Total critical care time: start 0345 End 0445 for total of 60 mins. Critical care time was exclusive of separately billable procedures and treating other patients. Critical care was necessary to treat or prevent imminent or life-threatening deterioration. Critical care was time spent personally by me on the following activities: development of treatment plan with patient and/or surrogate as well as nursing, discussions with consultants,  evaluation of patient's response to treatment, examination of patient, obtaining history from patient or surrogate, ordering and performing treatments and interventions, ordering and review of laboratory studies, ordering and review of radiographic studies, pulse oximetry and re-evaluation of patient's condition.

## 2019-07-08 NOTE — Discharge Summary (Addendum)
Discharge Summary  Tara Serrano NTZ:[id number] DOB: March 22, 1945  PCP: Ivan Anchors, MD  Admit date: June 26, 2019 Time of death: 11:48 AM on 06/30/19  Time spent: 77mns, more than 50% time spent on coordination of care.    Discharge Diagnoses:  Active Hospital Problems   Diagnosis Date Noted  . Nontraumatic intracranial hemorrhage ([place]   . Tachypnea   . Increased oropharyngeal secretions   . Palliative care by specialist   . Terminal care   . Hypertensive emergency 06/05/2019    Resolved Hospital Problems  No resolved problems to display.     Filed Weights   06/05/19 0445 06/06/19 0330 06/07/19 0146  Weight: 73.8 kg 75.1 kg 78.3 kg    History of present illness: (Per ICU admission notes on March 250  74year old female with a history of atrial fibrillation on Eliquis, HFpEF hypertension, DM, hyperlipidemia, polymyalgia rheumatica who was brought in by family members for increased lethargy and less interactive than usual.  She lives with family, and they report she has not been taking her home medications.  No noted fevers, chills, nausea, focal deficits.  Emergency department initial blood pressure was 239/108, patient has been somnolent throughout ED stay but arousable and answer simple questions.  CT head was negative, troponin very mildly elevated at 49, chest x-ray without widened mediastinum or pulmonary edema.  Leukocytosis of 16k without signs of infection chest x-ray. she was started on Cardene GTT with little improvement and changed to labetalol drip and PCCM consulted for admission    Hospital Course:  Active Problems:   Hypertensive emergency   Palliative care by specialist   Terminal care   Nontraumatic intracranial hemorrhage (HCC)   Tachypnea   Increased oropharyngeal secretions Brief summary:  74year old woman with history of atrial fibrillation on Eliquis, hypertension, CKD, diabetes, GERD, polymyalgia rheumatica presenting with generalized  weakness, fatigue, acute encephalopathy. Reported recent fall. Found to be hypertensive with BP 200/120s en route.  She was somnolent but opens eyes to voice and nods to questioning initially  She was admitted to ICU initially, MRI brain showed small acute to subacute infarct in multiple distributions. 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 MLynnette Caffey recommend repeat MRI in 4 to 6 weeks  Per ICU attending discussion with patient's sister who report progressive functional decline over the last year, patient is having difficulties in taking care of herself and may be noncompliant with medical therapy.  ICU consulted palliative care for goals of care.  Patient developed sudden neurologic changes on AApr 23, 2024around 4 AM, stat CT head showed large intracerebral hemorrhage, neurology recommended palliative care consult as patient's deemed not able to survive this.  She is made comfort measures around 4:00 this morning.  She passed away peacefully with family surrounding her at 11:48 AM.   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  Consultants:  Critical care  Neurology  Palliative care  PT/OT/speech    Allergies as of 404-23-2021     Reactions   Lipitor [atorvastatin]    Muscle aches   Metformin And Related Nausea Only   Pantoprazole    Headaches and diarrhea.    Statins Other (See Comments)   Muscle problems   Terazosin  Cough, fatigue.    Zetia [ezetimibe]    Muscle aches      Medication List    STOP taking these medications   Accu-Chek Guide test  strip Generic drug: glucose blood   Accu-Chek Guide w/Device Kit   cefdinir 300 MG capsule Commonly known as: OMNICEF   Cholecalciferol 25 MCG (1000 UT) tablet   DULoxetine 20 MG capsule Commonly known as: CYMBALTA   Eliquis 5 MG Tabs tablet Generic drug: apixaban   famotidine 20 MG tablet Commonly known as: Pepcid   ferrous sulfate 325 (65 FE) MG tablet   fluticasone 50 MCG/ACT nasal spray Commonly known as: FLONASE   furosemide 20 MG tablet Commonly known as: LASIX   hydrALAZINE 50 MG tablet Commonly known as: APRESOLINE   linagliptin 5 MG Tabs tablet Commonly known as: TRADJENTA   lisinopril 20 MG tablet Commonly known as: ZESTRIL   magnesium oxide 400 (241.3 Mg) MG tablet Commonly known as: MAG-OX   metoprolol succinate 100 MG 24 hr tablet Commonly known as: TOPROL-XL   predniSONE 1 MG tablet Commonly known as: DELTASONE   senna-docusate 8.6-50 MG tablet Commonly known as: Senokot-S   spironolactone 25 MG tablet Commonly known as: ALDACTONE      Allergies  Allergen Reactions  . Lipitor [Atorvastatin]     Muscle aches  . Metformin And Related Nausea Only  . Pantoprazole     Headaches and diarrhea.   . Statins Other (See Comments)    Muscle problems  . Terazosin     Cough, fatigue.   Marland Kitchen Zetia [Ezetimibe]     Muscle aches      The results of significant diagnostics from this hospitalization (including imaging, microbiology, ancillary and laboratory) are listed below for reference.    Significant Diagnostic Studies: CT Head Wo Contrast  Result Date: 06/05/2019 CLINICAL DATA:  Encephalopathy, recent fall EXAM: CT HEAD WITHOUT CONTRAST TECHNIQUE: Contiguous axial images were obtained from the base of the skull through the vertex without intravenous contrast. COMPARISON:  None. FINDINGS: Brain: Mild chronic white matter ischemic changes are noted. No findings to suggest acute hemorrhage, acute infarction or space-occupying mass lesion are seen.  Small heavily calcified meningioma is noted in the right frontal lobe near the vertex. Vascular: No hyperdense vessel or unexpected calcification. Skull: Normal. Negative for fracture or focal lesion. Sinuses/Orbits: No acute finding. Other: None. IMPRESSION: Chronic white matter ischemic change without acute abnormality. Electronically Signed   By: Inez Catalina M.D.   On: 06/05/2019 01:16   MR BRAIN WO CONTRAST  Addendum Date: 06/05/2019   ADDENDUM REPORT: 06/05/2019 07:56 ADDENDUM: These results were called by telephone at the time of interpretation on 06/05/2019 at 7:56 am to Dr Lynetta Mare , who verbally acknowledged these results. Electronically Signed   By: Monte Fantasia M.D.   On: 06/05/2019 07:56   Result Date: 06/05/2019 CLINICAL DATA:  Encephalopathy. EXAM: MRI HEAD WITHOUT CONTRAST TECHNIQUE: Multiplanar, multiecho pulse sequences of the brain and surrounding structures were obtained without intravenous contrast. COMPARISON:  Head CT from earlier today FINDINGS: Brain: Subcentimeter areas of scattered restricted diffusion in the bilateral cerebral white matter, posterior right frontal cortex, and bilateral basal ganglia. Weakly restricted diffusion in the left brachium pontis. These have the appearance of acute to subacute infarcts. There is extensive patchy FLAIR hyperintensity in the bilateral cerebellum, along the posterior cerebral cortices. Confluent FLAIR hyperintensity in the pons and deep gray nuclei. There is no associated volume loss, rather there is likely mild swelling. No history  of infectious symptoms, rather there is history of marked hypertension. There are chronic lacunes in the deep gray nuclei; remote left lateral lenticulostriate infarct affecting the basal ganglia. Brain volume is normal. There are a few foci of remote hemorrhage which is likely microvascular. No hydrocephalus or focal mass. Vascular: Normal flow voids Skull and upper cervical spine: Normal marrow signal  Sinuses/Orbits: Negative Other: A call has been placed to the ordering provider. IMPRESSION: 1. Small acute to subacute infarcts in multiple distributions suggesting recurrent central embolic disease in this patient with atrial fibrillation. 2. Patchy signal abnormality in the cerebellum, brainstem, and occipital parietal convexities. In the appropriate setting, favor posterior reversible encephalopathy syndrome. Suggest follow up to document resolution. 3. Extensive chronic small vessel ischemia. Electronically Signed: By: Monte Fantasia M.D. On: 06/05/2019 07:31   DG Chest Portable 1 View  Result Date: 06/05/2019 CLINICAL DATA:  Initial evaluation for acute altered mental status. EXAM: PORTABLE CHEST 1 VIEW COMPARISON:  Prior radiograph from 11/09/2016. FINDINGS: Cardiomegaly.  Mediastinal silhouette within normal limits. Lungs normally inflated. Streaky linear densities within the left lung base favored to reflect atelectasis. No consolidative opacity. No pulmonary edema or definite pleural effusion. No pneumothorax. No acute osseous finding. IMPRESSION: 1. Mild left basilar subsegmental atelectasis. No other active cardiopulmonary disease. 2. Cardiomegaly without pulmonary edema. Electronically Signed   By: Jeannine Boga M.D.   On: 06/05/2019 01:23   ECHOCARDIOGRAM COMPLETE  Result Date: 06/05/2019    ECHOCARDIOGRAM REPORT   Patient Name:   CAITLYNNE HARBECK Date of Exam: 06/05/2019 Medical Rec #:  [id number]      Height:       60.0 in Accession #:    [id number]     Weight:       162.7 lb Date of Birth:  [birth year]     BSA:          1.710 m Patient Age:    32 years       BP:           167/82 mmHg Patient Gender: F              HR:           93 bpm. Exam Location:  Inpatient Procedure: 2D Echo, Cardiac Doppler and Color Doppler Indications:    Hypertensive emergency  History:        Patient has prior history of Echocardiogram examinations, most                 recent 05/11/2018. Arrythmias:Atrial  Fibrillation; Risk                 Factors:Non-Smoker.  Sonographer:    Vickie Epley RDCS Referring Phys: [id number] [place]  1. Left ventricular ejection fraction, by estimation, is 60 to 65%. The left ventricle has normal function. The left ventricle has no regional wall motion abnormalities. There is moderate left ventricular hypertrophy. Left ventricular diastolic parameters are indeterminate.  2. Right ventricular systolic function is mildly reduced. The right ventricular size is normal. There is normal pulmonary artery systolic pressure. The estimated right ventricular systolic pressure is 68.0 mmHg.  3. The mitral valve is normal in structure. Mild mitral valve regurgitation.  4. The aortic valve is tricuspid. Aortic valve regurgitation is not visualized. Mild aortic valve sclerosis is present, with no evidence of aortic valve stenosis.  5. The inferior vena cava is normal in size with greater than 50% respiratory variability, suggesting right atrial pressure of 3 mmHg.  FINDINGS  Left Ventricle: Left ventricular ejection fraction, by estimation, is 60 to 65%. The left ventricle has normal function. The left ventricle has no regional wall motion abnormalities. The left ventricular internal cavity size was normal in size. There is  moderate left ventricular hypertrophy. Left ventricular diastolic parameters are indeterminate. Right Ventricle: The right ventricular size is normal. Right vetricular wall thickness was not assessed. Right ventricular systolic function is mildly reduced. There is normal pulmonary artery systolic pressure. The tricuspid regurgitant velocity is 2.54  m/s, and with an assumed right atrial pressure of 3 mmHg, the estimated right ventricular systolic pressure is 40.7 mmHg. Left Atrium: Left atrial size was normal in size. Right Atrium: Right atrial size was normal in size. Pericardium: Trivial pericardial effusion is present. Mitral Valve: The mitral valve is normal in  structure. Mild mitral valve regurgitation. Tricuspid Valve: The tricuspid valve is normal in structure. Tricuspid valve regurgitation is trivial. Aortic Valve: The aortic valve is tricuspid. Aortic valve regurgitation is not visualized. Mild aortic valve sclerosis is present, with no evidence of aortic valve stenosis. Pulmonic Valve: The pulmonic valve was not well visualized. Pulmonic valve regurgitation is not visualized. Aorta: The aortic root is normal in size and structure. Venous: The inferior vena cava is normal in size with greater than 50% respiratory variability, suggesting right atrial pressure of 3 mmHg. IAS/Shunts: The interatrial septum was not well visualized.  LEFT VENTRICLE PLAX 2D LVIDd:         3.70 cm LVIDs:         2.80 cm LV PW:         1.10 cm LV IVS:        1.10 cm LVOT diam:     1.90 cm LV SV:         42 LV SV Index:   25 LVOT Area:     2.84 cm  LV Volumes (MOD) LV vol d, MOD A2C: 71.1 ml LV vol d, MOD A4C: 72.8 ml LV vol s, MOD A2C: 21.6 ml LV vol s, MOD A4C: 36.4 ml LV SV MOD A2C:     49.5 ml LV SV MOD A4C:     72.8 ml LV SV MOD BP:      44.4 ml RIGHT VENTRICLE RV S prime:     6.23 cm/s TAPSE (M-mode): 1.3 cm LEFT ATRIUM             Index       RIGHT ATRIUM           Index LA diam:        3.20 cm 1.87 cm/m  RA Area:     11.50 cm LA Vol (A2C):   37.3 ml 21.81 ml/m RA Volume:   26.30 ml  15.38 ml/m LA Vol (A4C):   37.4 ml 21.87 ml/m LA Biplane Vol: 40.3 ml 23.57 ml/m  AORTIC VALVE LVOT Vmax:   71.80 cm/s LVOT Vmean:  50.800 cm/s LVOT VTI:    0.149 m  AORTA Ao Root diam: 3.10 cm MR Peak grad: 102.4 mmHg  TRICUSPID VALVE MR Vmax:      506.00 cm/s TR Peak grad:   25.8 mmHg                           TR Vmax:        254.00 cm/s  SHUNTS                           Systemic VTI:  0.15 m                           Systemic Diam: 1.90 cm Oswaldo Milian MD Electronically signed by Oswaldo Milian MD Signature Date/Time: 06/05/2019/2:43:50 PM    Final    CT  HEAD CODE STROKE WO CONTRAST  Result Date: 07/08/19 CLINICAL DATA:  Code stroke.  Unresponsive with lung pupils EXAM: CT HEAD WITHOUT CONTRAST TECHNIQUE: Contiguous axial images were obtained from the base of the skull through the vertex without intravenous contrast. COMPARISON:  Brain MRI from 2 days ago FINDINGS: Brain: 6.3 x 4.2 x 5.9 cm hemorrhage centered in the left basal ganglia dissecting into the left midbrain. There is extensive vasogenic edema in the ventricles are obstructed at the level of the third ventricle or cerebral aqua duct. Intraventricular hemorrhage extends to the foramina of [place]. Midline shift measures 2.1 cm. There are multiple underlying infarcts by preceding brain MRI. Vascular: No hyperdense vessel or unexpected calcification. Skull: Normal. Negative for fracture or focal lesion. Sinuses/Orbits: No acute finding. Other: Critical Value/emergent results were called by telephone at the time of interpretation on 07/08/2019 at 4:20 am to provider [facility] , who verbally acknowledged these results. IMPRESSION: 78 cc acute left basal ganglia hemorrhage with vasogenic edema, intraventricular extension, and ventricular obstruction. Midline shift measures 2.1 cm. Electronically Signed   By: Monte Fantasia M.D.   On: Jul 08, 2019 04:23    Microbiology: Recent Results (from the past 240 hour(s))  Respiratory Panel by RT PCR (Flu A&B, Covid) - Nasopharyngeal Swab     Status: None   Collection Time: 06/05/19  2:02 AM   Specimen: Nasopharyngeal Swab  Result Value Ref Range Status   SARS Coronavirus 2 by RT PCR NEGATIVE NEGATIVE Final    Comment: (NOTE) SARS-CoV-2 target nucleic acids are NOT DETECTED. The SARS-CoV-2 RNA is generally detectable in upper respiratoy specimens during the acute phase of infection. The lowest concentration of SARS-CoV-2 viral copies this assay can detect is 131 copies/mL. A negative result does not preclude SARS-Cov-2 infection and should not be  used as the sole basis for treatment or other patient management decisions. A negative result may occur with  improper specimen collection/handling, submission of specimen other than nasopharyngeal swab, presence of viral mutation(s) within the areas targeted by this assay, and inadequate number of viral copies (<131 copies/mL). A negative result must be combined with clinical observations, patient history, and epidemiological information. The expected result is Negative. Fact Sheet for Patients:  PinkCheek.be Fact Sheet for Healthcare Providers:  GravelBags.it This test is not yet ap proved or cleared by the [place] FDA and  has been authorized for detection and/or diagnosis of SARS-CoV-2 by FDA under an Emergency Use Authorization (EUA). This EUA will remain  in effect (meaning this test can be used) for the duration of the COVID-19 declaration under Section 564(b)(1) of the Act, 21 U.S.C. section 360bbb-3(b)(1), unless the authorization is terminated or revoked sooner.    Influenza A by PCR NEGATIVE NEGATIVE Final   Influenza B by PCR NEGATIVE NEGATIVE Final    Comment: (NOTE) The Xpert Xpress SARS-CoV-2/FLU/RSV assay is intended as an aid in  the diagnosis of influenza from Nasopharyngeal swab specimens and  should not be used as a sole basis for treatment.  Nasal washings and  aspirates are unacceptable for Xpert Xpress SARS-CoV-2/FLU/RSV  testing. Fact Sheet for Patients: PinkCheek.be Fact Sheet for Healthcare Providers: GravelBags.it This test is not yet approved or cleared by the [place] FDA and  has been authorized for detection and/or diagnosis of SARS-CoV-2 by  FDA under an Emergency Use Authorization (EUA). This EUA will remain  in effect (meaning this test can be used) for the duration of the  Covid-19 declaration under Section 564(b)(1) of the  Act, 21  U.S.C. section 360bbb-3(b)(1), unless the authorization is  terminated or revoked. Performed at [facility], [place] [address], [place], [place] [address]   MRSA PCR Screening     Status: None   Collection Time: 06/05/19  5:13 AM   Specimen: Nasopharyngeal  Result Value Ref Range Status   MRSA by PCR NEGATIVE NEGATIVE Final    Comment:        The GeneXpert MRSA Assay (FDA approved for NASAL specimens only), is one component of a comprehensive MRSA colonization surveillance program. It is not intended to diagnose MRSA infection nor to guide or monitor treatment for MRSA infections. Performed at [facility], [place] [address] [place], [place] [address]      Labs: Basic Metabolic Panel: Recent Labs  Lab 06/05/19 0024 06/05/19 0349 06/05/19 0501 06/06/19 1000  NA 134* 133* 137 135  K 3.2* 2.8* 3.5 3.8  CL 94*  --  95* 95*  CO2 24  --  26 27  GLUCOSE 186*  --  198* 257*  BUN 19  --  20 26*  CREATININE 1.30*  --  1.20* 1.46*  CALCIUM 8.4*  --  8.3* 8.2*  MG  --   --  1.8  --   PHOS  --   --  3.2  --    Liver Function Tests: Recent Labs  Lab 06/05/19 0024  AST 21  ALT 12  ALKPHOS 66  BILITOT 1.6*  PROT 5.2*  ALBUMIN 2.3*   No results for input(s): LIPASE, AMYLASE in the last 168 hours. Recent Labs  Lab 06/05/19 0028  AMMONIA 11   CBC: Recent Labs  Lab 06/05/19 0024 06/05/19 0349 06/05/19 0501 06/06/19 1000  WBC 16.0*  --  12.8* 10.1  NEUTROABS  --   --  11.0*  --   HGB 11.6* 11.2* 11.6* 10.6*  HCT 37.5 33.0* 37.6 35.3*  MCV 87.6  --  86.4 89.1  PLT 377  --  347 367   Cardiac Enzymes: Recent Labs  Lab 06/05/19 0024  CKTOTAL 94   BNP: BNP (last 3 results) Recent Labs    06/05/19 0501  BNP 765.5*    ProBNP (last 3 results) No results for input(s): PROBNP in the last 8760 hours.  CBG: Recent Labs  Lab 06/07/19 0608 06/07/19 1112 06/07/19 1704 06/07/19 2126 07-08-2019 0331  GLUCAP 152* 159* 219* 267* 248*        Signed:  Florencia Reasons MD, PhD, FACP  [facility] Jul 08, 2019, 12:16 PM

## 2019-07-08 NOTE — Significant Event (Signed)
Rapid Response Event Note  Overview: Time Called: 0333 Arrival Time: 0336  Initial Focused Assessment: Called d/t pt unresponsiveness LSN 0100, pt has snoring respirations, flexion in lowers to pain, no eye opening, unequal pupils. SpO2 87 on RA, HR 96, RR 28, SBP 244.   Interventions: 10mg  Hydralazine  2L [place] CT head   Plan of Care (if not transferred): Pt transition to comfort.  Event Summary: Name of Physician Notified: Baltazar Najjar NP at [place]  Name of Consulting Physician Notified: Leonel Ramsay MD at 0355  Outcome: Code status clarified  Event End Time: [place]

## 2019-07-08 NOTE — Progress Notes (Addendum)
Code stroke was called due to sudden neurological worsening.  I met the patient in the CT scanner which point she had a fixed left pupil at 7 mm, minimally reactive right pupil at 4 mm, present corneal on the right, absent on the left, bilateral extensor posturing.  Her BP was initially 208 systolic, and had been treated with IV hydralazine.  Her head CT demonstrated a massive intracerebral hemorrhage centered over the subcortical structures on the left.  ICH score 4.   At this point, I feel that this is a nonsurvivable hemorrhage.  I called the daughter to discuss her change in status.  The daughter relates that she had a conversation with her mother the night before and had asked her about life support to which the patient had said she would not want it.  Given this conversation, as well as the futility of any efforts given the size of the hemorrhage and rapid progression, I discussed options with the daughter who opted to proceed with comfort only care. Therefore no reversal or intubation was pursued.   This patient is critically ill and at significant risk of neurological worsening, death and care requires constant monitoring of vital signs, hemodynamics,respiratory and cardiac monitoring, neurological assessment, discussion with family, other specialists and medical decision making of high complexity. I spent 45 minutes of neurocritical care time  in the care of  this patient. This was time spent independent of any time provided by nurse practitioner or PA.  Roland Rack, MD [facility] [phone]  If 7pm- 7am, please page neurology on call as listed in AMION. 07/02/19  4:32 AM

## 2019-07-08 NOTE — Death Summary Note (Signed)
Patients family members at bedside at the time of death, family notified this RN, myself and 2nd RN Ermalene Postin verified time of death by hearing no HR or respirations for over a minute. Time of death confirmed at 11:48am.

## 2019-07-08 NOTE — Progress Notes (Signed)
I responded to consult for pastoral support. Pt is actively transitioning and unconscious. Family is at bedside. I offered spiritual care with ministry of presence, words of comfort, and empathic listening. Chaplain services available as needed.   Palliative care Chaplain resident Fidel Levy [phone]

## 2019-07-08 NOTE — Progress Notes (Signed)
Called to patient's bedside for stat ABG.  Patient unresponsive to pain.  Decision made to go to CT and ABG order dc's.  Nasal trumpet inserted for snoring.

## 2019-07-08 NOTE — Progress Notes (Signed)
Daily Progress Note   Patient Name: Tara Serrano       Date: 06-26-19 DOB: [birth year]  Age: 74 y.o. MRN#: [id number] Attending Physician: Tara Reasons, MD Primary Care Physician: Tara Anchors, MD Admit Date: 05/30/2019  Reason for Consultation/Follow-up: Establishing goals of care  Subjective: Patient unresponsive to sternal rub. Appears mildly uncomfortable with increased respiratory rate and accessory muscle use. RN instructed to give prn dilaudid and remove nasal trumpet. Scopolamine TD ordered.  GOC:  Family at bedside including patient's sister, daughter, son, and grandson. Reviewed devastating events from this morning including code stroke, massive intracerebral hemorrhage, and daughter (Tara Serrano's) conversation with Dr. Leonel Serrano this morning. Tara Serrano shares that following our discussion yesterday afternoon, she spoke with her mother when at bedside about advance directives and her wishes. Last night, the patient shared with Tara Serrano she would not wish for life support measures if critically ill. Tara Serrano and family understand diagnoses and poor prognosis, and agree to not prolong the inevitable by putting her on life support. Tara Serrano and family confirm decision for shift to comfort measures only, understanding severity of hemorrhage and poor prognosis  Discussed comfort focused care plan and symptom management medications to ensure comfort and relief from suffering. Discussed discontinuation of interventions not aimed at comfort and prepared family for 'anything to happen at anytime.' Explained that we will further discuss hospice facility if appropriate, but plan to not pursue transfer today following this acute event and tough decisions that were made. Family understands and agrees.  Family  shares that they are not surprised by this (with her history of DM, HTN, afib) and that this is how the patient's mother died, from a sudden massive stroke. Emotional support provided. Therapeutic listening. Chaplain support offered.   PMT contact information given. Family knows to call with questions or concerns.   Length of Stay: 3  Current Medications: Scheduled Meds:  . Chlorhexidine Gluconate Cloth  6 each Topical Daily  . scopolamine  1 patch Transdermal Q72H    Continuous Infusions:   PRN Meds: glycopyrrolate, hydrALAZINE, HYDROmorphone (DILAUDID) injection, LORazepam  Physical Exam Vitals and nursing note reviewed.  Constitutional:      Comments: unresponsive  Pulmonary:     Effort: Tachypnea and accessory muscle usage present. No respiratory distress.     Breath sounds: Rhonchi present.  Comments: Audible secretions. Mild discomfort. RN to given prn dilaudid. Neurological:     Comments: unresponsive            Vital Signs: BP (!) 158/90 (BP Location: Left Arm)   Pulse 93   Temp 98.3 F (36.8 C) (Oral)   Resp 18   Ht 5' (1.524 m)   Wt 78.3 kg   SpO2 99%   BMI 33.71 kg/m  SpO2: SpO2: 99 % O2 Device: O2 Device: Room Air O2 Flow Rate:    Intake/output summary:   Intake/Output Summary (Last 24 hours) at 2019-06-15 0947 Last data filed at 06/15/2019 0437 Gross per 24 hour  Intake 240 ml  Output 1000 ml  Net -760 ml   LBM: Last BM Date: 06/06/19 Baseline Weight: Weight: 79.4 kg Most recent weight: Weight: 78.3 kg       Palliative Assessment/Data: PPS 10%    Flowsheet Rows     Most Recent Value  Intake Tab  Referral Department  Hospitalist  Unit at Time of Referral  Cardiac/Telemetry Unit  Palliative Care Primary Diagnosis  Other (Comment)  Palliative Care Type  New Palliative care  Reason for referral  Clarify Goals of Care  Date first seen by Palliative Care  06/07/19  Clinical Assessment  Palliative Performance Scale Score  10%  Psychosocial  & Spiritual Assessment  Palliative Care Outcomes  Patient/Family meeting held?  Yes  Who was at the meeting?  daughter, son, sister, grandson  Palliative Care Outcomes  Clarified goals of care, Counseled regarding hospice, Provided end of life care assistance, Provided psychosocial or spiritual support, Changed to focus on comfort      Patient Active Problem List   Diagnosis Date Noted  . Nontraumatic intracranial hemorrhage ([place])   . Palliative care by specialist   . Goals of care, counseling/discussion   . Hypertensive emergency 06/05/2019  . Hypertensive encephalopathy   . Nonischemic cardiomyopathy ([place]) 05/08/2019  . Elevated LFTs   . Pulmonary nodule   . Rib lesion   . Atrial fibrillation ([place])   . Cholangitis   . Acute cholecystitis 11/06/2016    Palliative Care Assessment & Plan   Patient Profile: 74 y.o. female  with past medical history of atrial fibrillation on Eliquis, hypertension, HFpEF, DM, HLD, polymyalgia rheumatica, CKD admitted on 05/31/2019 with increased lethargy. Initial blood pressure in ED 239/108 started on cardene gtt with minimal improvement, changed to labetalol gtt. MRI brain revealed small acute to subacute infarcts. Neurology following and recommended to continue anticoagulation, no statin due to myopathy, and outpatient neurology f/u with Dr. Lynnette Serrano for repeat MRI in 4-6 weeks. PT consult pending. Palliative medicine consultation for goals of care. On 4/1 AM, patient with sudden neurological deterioration, code stroke called. CT head demonstrated massive intracerebral hemorrhage. Per Dr. Cecil Serrano conversation with daughter, this is a non-survivable hemorrhage and futility of aggressive efforts given the size of hemorrhage and rapid progression. Decision made for transition to comfort measures.    Assessment: Hypertensive emergency Multifocal embolic disease Afib, chronic Diastolic CHF Polymyalgia rheumatica Non-insulin dependent diabetes Acute  massive intracerebral hemorrhage  Recommendations/Plan:  F/u GOC following code stroke this AM.  Daughter confirms her conversation with patient last night, that she would not wish for heroic measures/life support if critically ill. DNR/DNI  Transition to comfort measures only. Family understands poor prognosis and interventions not aimed at comfort will be discontinued.  Symptom management  Dilaudid 0.5-1mg  IV q2h prn pain/dyspnea/air hunger/tahcypnea  Ativan 0.5mg  IV q6h prn anxiety/agitation  Robinul 0.2mg  IV q4h prn secretions  Scopolamine TD  Consider scheduled dilaudid or dilaudid gtt if patient requiring frequent prns  Spiritual care consult for EOL care.  Unrestricted visitor access.  Frequent oral care and repositioning.  RN may pronounce.  Goals of Care and Additional Recommendations:  Limitations on Scope of Treatment: Full Comfort Care  Code Status: DNR/DNI   Code Status Orders  (From admission, onward)         Start     Ordered   06/27/2019 0420  Do not attempt resuscitation (DNR)  Continuous    Question Answer Comment  In the event of cardiac or respiratory ARREST Do not call a "code blue"   In the event of cardiac or respiratory ARREST Do not perform Intubation, CPR, defibrillation or ACLS   In the event of cardiac or respiratory ARREST Use medication by any route, position, wound care, and other measures to relive pain and suffering. May use oxygen, suction and manual treatment of airway obstruction as needed for comfort.      06/27/19 0419        Code Status History    Date Active Date Inactive Code Status Order ID Comments User Context   06/05/2019 0330 2019/06/27 0419 Full Code [id number]  Gleason, Sissy [facility]   11/06/2016 0747 11/12/2016 1938 Partial Code [id number]  Omar Person, NP ED   Advance Care Planning Activity       Prognosis:   Hours - Days  Discharge Planning:  To Be Determined: hospital death versus hospice transfer  if appropriate  Care plan was discussed with RN, Dr. Erlinda Hong, daughter, son, grandson, sister  Thank you for allowing the Palliative Medicine Team to assist in the care of this patient.   Time In: 0745- Time Out: 0830 Total Time 45 Prolonged Time Billed  no      Greater than 50%  of this time was spent counseling and coordinating care related to the above assessment and plan.  Ihor Dow, DNP, FNP-C Palliative Medicine Team  Phone: [phone] Fax: [phone]  Please contact Palliative Medicine Team phone at [phone] for questions and concerns.

## 2019-07-08 DEATH — deceased

## 2019-12-06 IMAGING — CT CT CHEST W/O CM
2 of 3 series · 15 of 36 positions shown, 18 images · non-contrast
Comparison: 06/04/2016.  PET of 12/10/2016.

CLINICAL DATA: Followup of pulmonary nodule.  Cough.

EXAM:
CT CHEST WITHOUT CONTRAST
TECHNIQUE: Multidetector CT imaging of the chest was performed following the
standard protocol without IV contrast.

[Series 2: thorax · axial · 0.69mm/px · z∈[-325,-51]mm · 12 of 161 slices shown, 15 images]
[im 12/161  mediastinal]
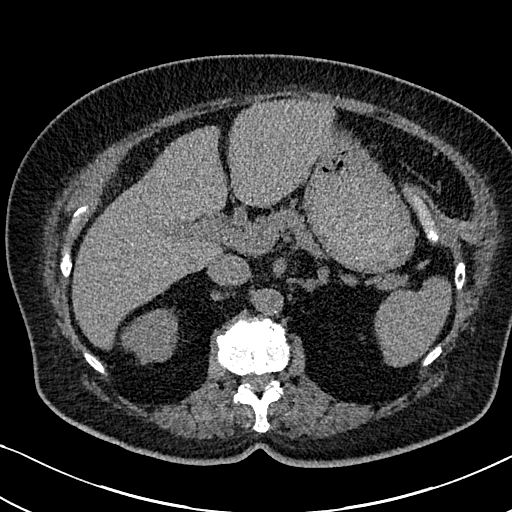
[im 12/161  lung]
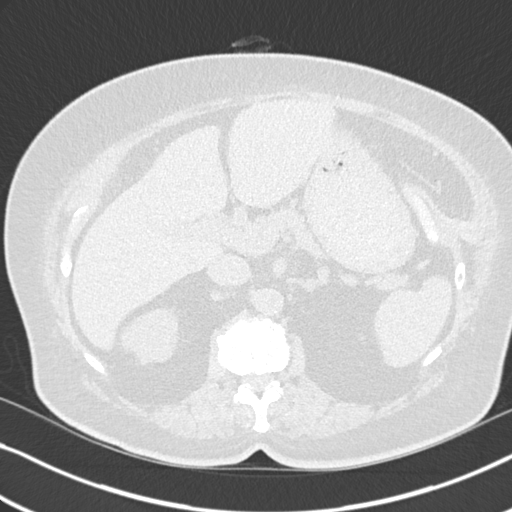
[im 24/161  lung]
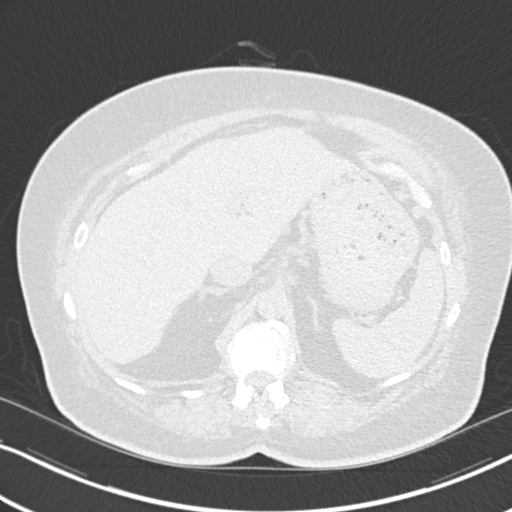
[im 36/161  lung]
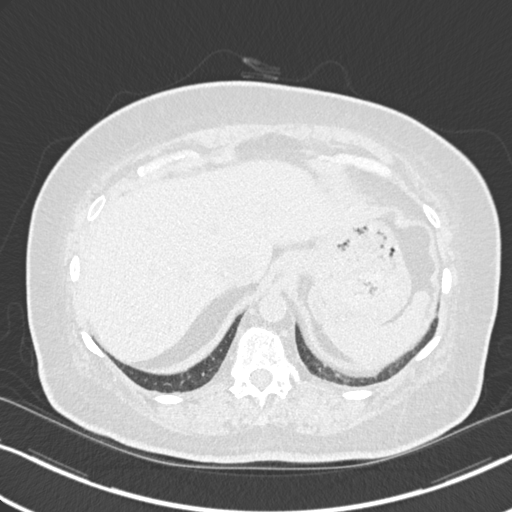
[im 48/161  lung]
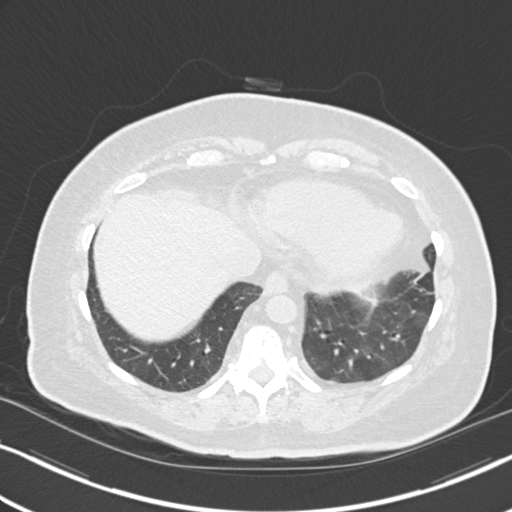
[im 60/161  mediastinal]
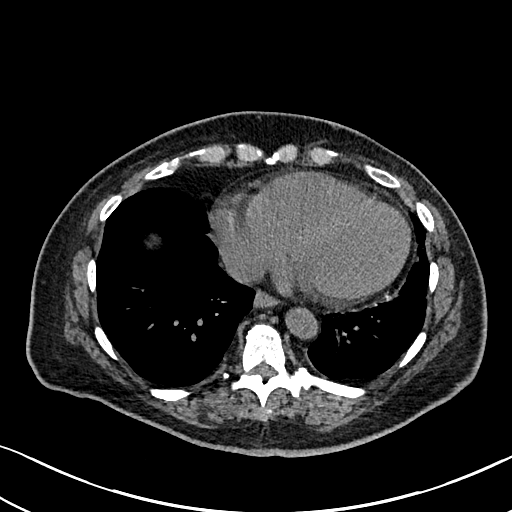
[im 60/161  lung]
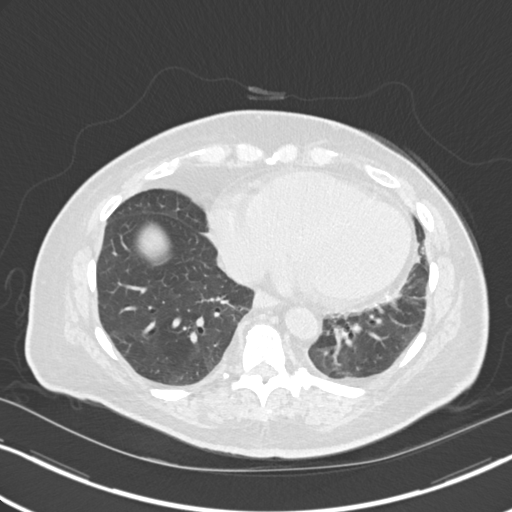
[im 72/161  lung]
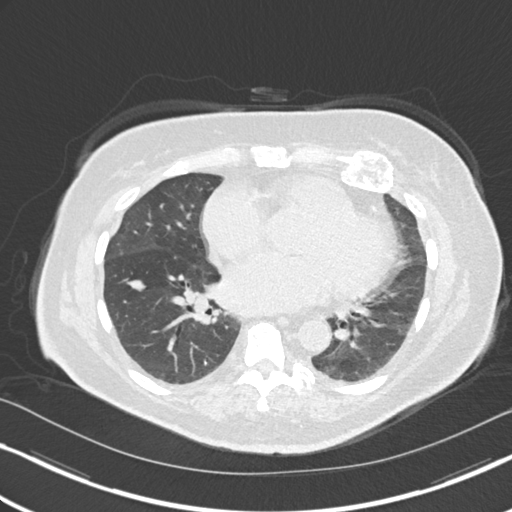
[im 89/161  lung]
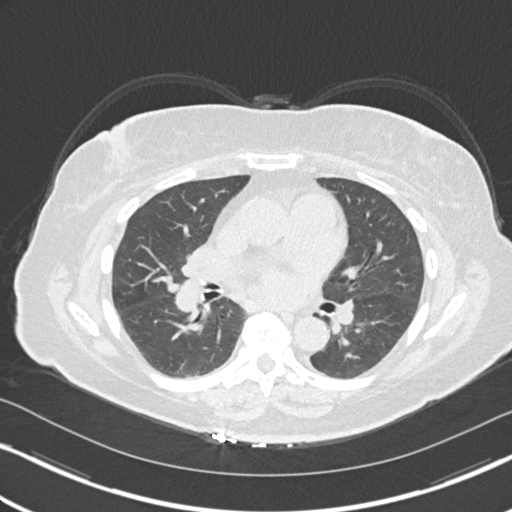
[im 101/161  lung]
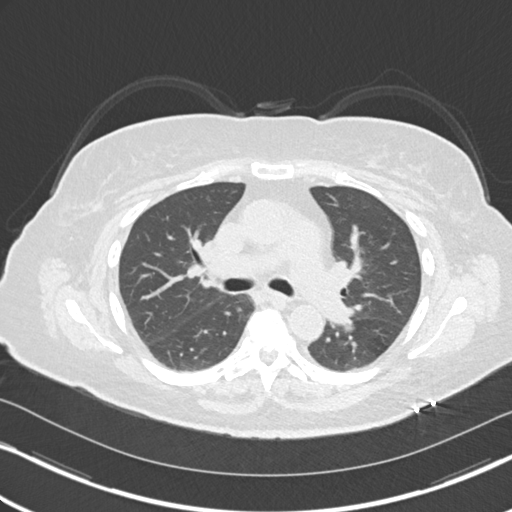
[im 113/161  mediastinal]
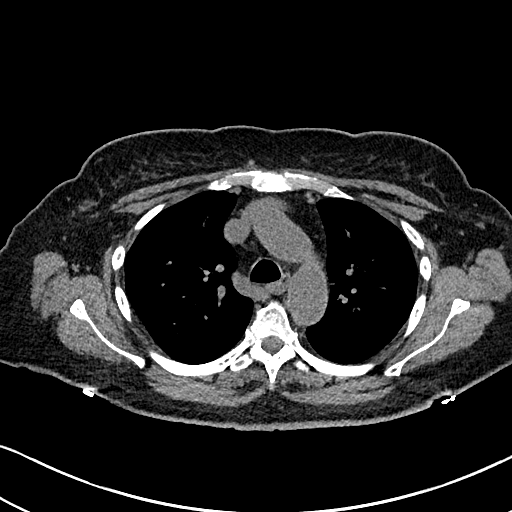
[im 113/161  lung]
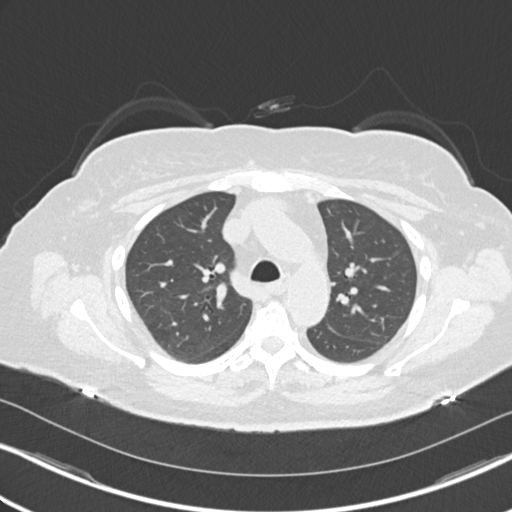
[im 125/161  lung]
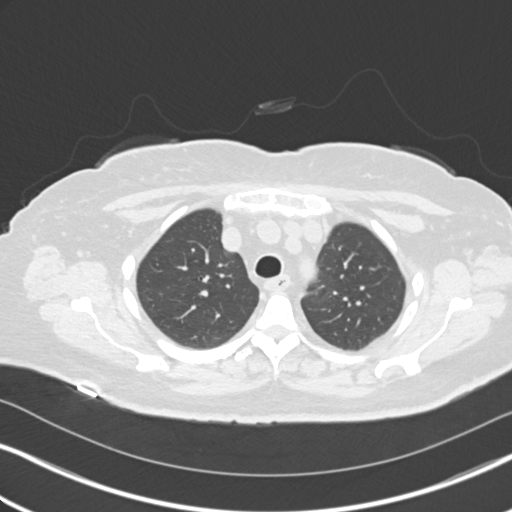
[im 137/161  lung]
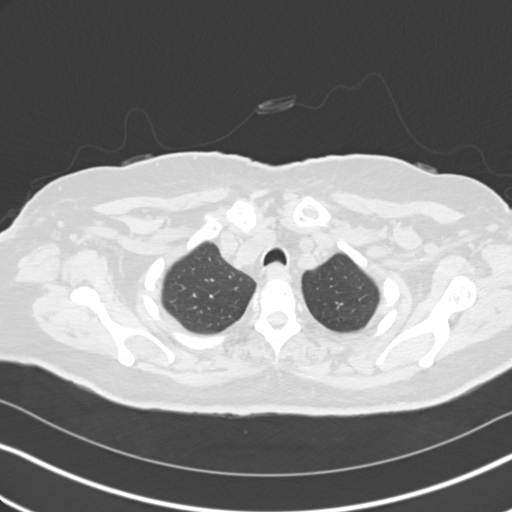
[im 149/161  lung]
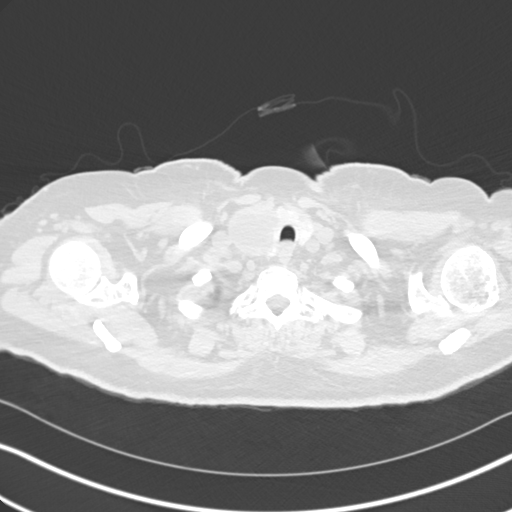

[Series 6: coronal · coronal · 0.63mm/px · 3 of 122 slices shown]
[im 25/122  lung]
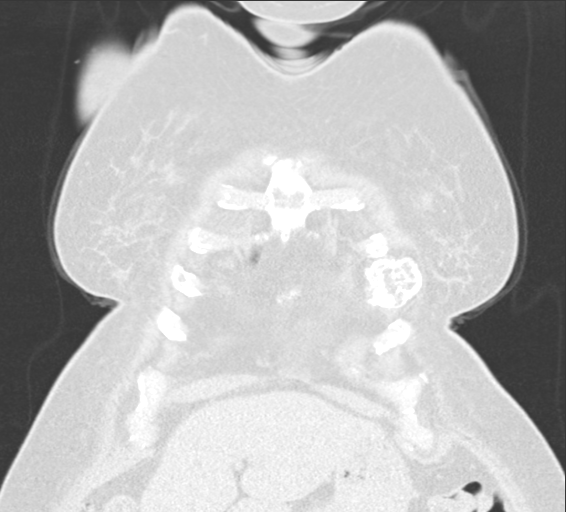
[im 49/122  lung]
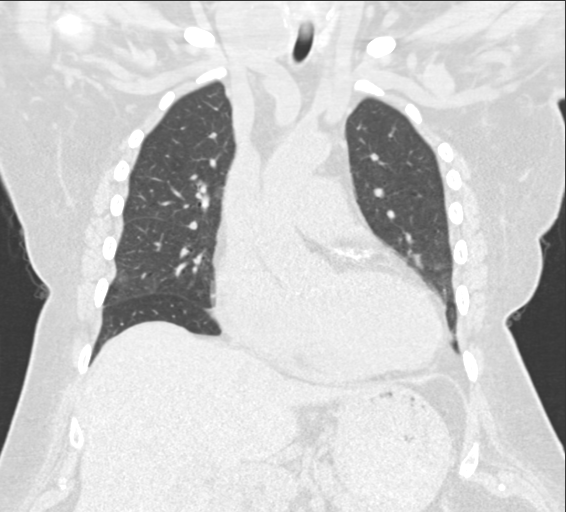
[im 73/122  lung]
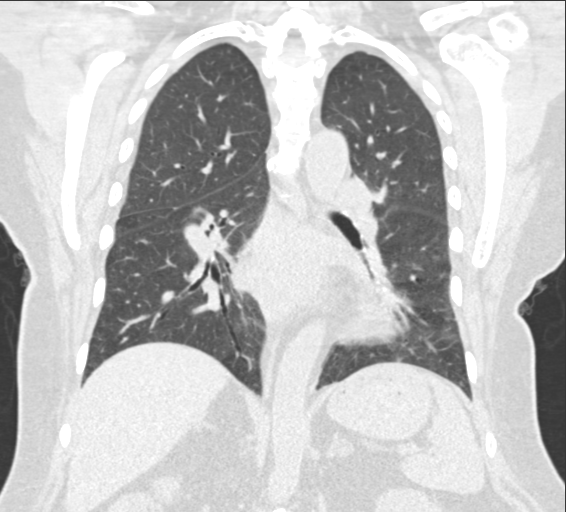

[15 of 36 positions shown; findings below may reference images not displayed]

FINDINGS: Cardiovascular: Tortuous thoracic aorta. Moderate cardiomegaly,
without pericardial effusion. Multivessel coronary artery
atherosclerosis. Pulmonary artery enlargement, outflow tract 3.3 cm

Mediastinum/Nodes: Cystic lesion within the right lobe of the
thyroid is similar, including at 3.2 cm. No mediastinal or definite
hilar adenopathy, given limitations of unenhanced CT.

Lungs/Pleura: No pleural fluid. Right lower lobe pulmonary nodule of
11 x 8 mm on image 88/5, similar.

Just cephalad, 4 mm right lower lobe nodule on image 84/5 is also
not significantly changed.

Minimal more inferior right lower lobe nodularity at approximately 4
mm on image 111/5, unchanged.

Upper Abdomen: Cholecystectomy. Normal imaged portions of the liver,
spleen, stomach, right adrenal gland, kidneys. Left adrenal adenoma
at 1.4 cm. Low-density pancreatic body lesion measures 1.5 cm on
image 161/2, incompletely imaged.

Musculoskeletal: Expansile anterior left fifth rib lesion measures
3.7 x 2.6 cm on image 89/2 versus 3.6 x 2.7 cm on the prior exam.
Lower thoracic and upper lumbar spondylosis.
IMPRESSION: 1. Similar right lower lobe pulmonary nodules, including a dominant
11 mm nodule. Stability for approximately 1 year has been confirmed.
Per consensus criteria, a total of 2 years of stability is required.
Recommend CT follow-up at 6-12 months.
2. Similar appearance of anterior left fifth rib lesion with
differential considerations of enchondroma versus low-grade
chondrosarcoma.
3. Cystic lesion within the pancreatic body is incompletely imaged.
This may be new or enlarged since the prior MRI. Recommend follow-up
with abdominal pre and postcontrast MRCP/MRI at approximately 1
year. Please see 11/07/2016 MRI report for further discussion.
4. Pulmonary artery enlargement suggests pulmonary arterial
hypertension.

## 2021-01-08 ENCOUNTER — Ambulatory Visit: Payer: Medicare Other | Admitting: Internal Medicine
